# Patient Record
Sex: Male | Born: 1950
Health system: Southern US, Community
[De-identification: ages and names within clinical notes are randomized; demographics above are authoritative.]

## PROBLEM LIST (undated history)

## (undated) DIAGNOSIS — Z8673 Personal history of transient ischemic attack (TIA), and cerebral infarction without residual deficits: Secondary | ICD-10-CM

## (undated) DIAGNOSIS — E785 Hyperlipidemia, unspecified: Secondary | ICD-10-CM

## (undated) DIAGNOSIS — Z800 Family history of malignant neoplasm of digestive organs: Secondary | ICD-10-CM

## (undated) DIAGNOSIS — I10 Essential (primary) hypertension: Secondary | ICD-10-CM

## (undated) HISTORY — DX: Family history of malignant neoplasm of digestive organs: Z80.0

## (undated) HISTORY — DX: Essential (primary) hypertension: I10

## (undated) HISTORY — DX: Personal history of transient ischemic attack (TIA), and cerebral infarction without residual deficits: Z86.73

## (undated) HISTORY — PX: TONSILLECTOMY: SUR1361

## (undated) HISTORY — DX: Hyperlipidemia, unspecified: E78.5

---
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
## 2011-01-11 DIAGNOSIS — I639 Cerebral infarction, unspecified: Secondary | ICD-10-CM

## 2011-01-11 HISTORY — DX: Cerebral infarction, unspecified: I63.9

## 2011-06-22 DIAGNOSIS — I6330 Cerebral infarction due to thrombosis of unspecified cerebral artery: Secondary | ICD-10-CM | POA: Insufficient documentation

## 2011-06-22 DIAGNOSIS — Z8673 Personal history of transient ischemic attack (TIA), and cerebral infarction without residual deficits: Secondary | ICD-10-CM

## 2011-06-22 HISTORY — DX: Personal history of transient ischemic attack (TIA), and cerebral infarction without residual deficits: Z86.73

## 2012-12-31 DIAGNOSIS — M19049 Primary osteoarthritis, unspecified hand: Secondary | ICD-10-CM | POA: Insufficient documentation

## 2014-04-25 ENCOUNTER — Ambulatory Visit
Admit: 2014-04-25 | Disposition: A | Discharge status: Home / Self Care | Payer: Self-pay | Attending: Family Medicine | Admitting: Family Medicine

## 2014-06-27 ENCOUNTER — Telehealth: Payer: Self-pay | Admitting: Family Medicine

## 2014-06-27 DIAGNOSIS — B369 Superficial mycosis, unspecified: Secondary | ICD-10-CM | POA: Insufficient documentation

## 2014-06-27 MED ORDER — NYSTATIN-TRIAMCINOLONE 100000-0.1 UNIT/GM-% EX CREA: TOPICAL_CREAM | Freq: Two times a day (BID) | CUTANEOUS | Status: DC

## 2014-06-27 NOTE — Telephone Encounter (Signed)
Refilled

## 2014-06-27 NOTE — Telephone Encounter (Signed)
Requesting a refill on the ointment that you had prescribed for a rash that he had on his ankle (Nystatin) . Please send to [email or web address]

## 2014-06-27 NOTE — Telephone Encounter (Signed)
Patient is requesting a refill of his Nystatin Ointment

## 2014-07-07 ENCOUNTER — Other Ambulatory Visit: Payer: Self-pay | Admitting: Family Medicine

## 2014-07-07 DIAGNOSIS — I10 Essential (primary) hypertension: Secondary | ICD-10-CM

## 2014-07-07 MED ORDER — LOSARTAN POTASSIUM-HCTZ 100-25 MG PO TABS: 1.0000 | ORAL_TABLET | Freq: Every day | ORAL | Status: DC

## 2014-07-07 NOTE — Telephone Encounter (Signed)
Requesting refill on Losartan. Please send to [email or web address]. Last seen 04-25-14. Patient is completely out. [phone]

## 2014-07-07 NOTE — Telephone Encounter (Signed)
Received a message, stating he needed refill of Hyzaar (100/25)

## 2014-07-21 ENCOUNTER — Encounter: Payer: Self-pay | Admitting: Family Medicine

## 2014-07-21 ENCOUNTER — Ambulatory Visit (INDEPENDENT_AMBULATORY_CARE_PROVIDER_SITE_OTHER): Payer: BLUE CROSS/BLUE SHIELD | Admitting: Family Medicine

## 2014-07-21 VITALS — BP 110/68 | HR 100 | Temp 98.1°F | Resp 18 | Ht 65.0 in | Wt 141.1 lb

## 2014-07-21 DIAGNOSIS — M25569 Pain in unspecified knee: Secondary | ICD-10-CM | POA: Insufficient documentation

## 2014-07-21 DIAGNOSIS — R638 Other symptoms and signs concerning food and fluid intake: Secondary | ICD-10-CM | POA: Diagnosis not present

## 2014-07-21 DIAGNOSIS — Z131 Encounter for screening for diabetes mellitus: Secondary | ICD-10-CM | POA: Diagnosis not present

## 2014-07-21 DIAGNOSIS — Z125 Encounter for screening for malignant neoplasm of prostate: Secondary | ICD-10-CM

## 2014-07-21 DIAGNOSIS — I10 Essential (primary) hypertension: Secondary | ICD-10-CM

## 2014-07-21 DIAGNOSIS — I739 Peripheral vascular disease, unspecified: Secondary | ICD-10-CM | POA: Insufficient documentation

## 2014-07-21 DIAGNOSIS — E785 Hyperlipidemia, unspecified: Secondary | ICD-10-CM | POA: Diagnosis not present

## 2014-07-21 DIAGNOSIS — R21 Rash and other nonspecific skin eruption: Secondary | ICD-10-CM | POA: Insufficient documentation

## 2014-07-21 NOTE — Patient Instructions (Signed)
Dehydration, Adult Dehydration is when you lose more fluids from the body than you take in. Vital organs like the kidneys, brain, and heart cannot function without a proper amount of fluids and salt. Any loss of fluids from the body can cause dehydration.  CAUSES   Vomiting.  Diarrhea.  Excessive sweating.  Excessive urine output.  Fever. SYMPTOMS  Mild dehydration  Thirst.  Dry lips.  Slightly dry mouth. Moderate dehydration  Very dry mouth.  Sunken eyes.  Skin does not bounce back quickly when lightly pinched and released.  Dark urine and decreased urine production.  Decreased tear production.  Headache. Severe dehydration  Very dry mouth.  Extreme thirst.  Rapid, weak pulse (more than 100 beats per minute at rest).  Cold hands and feet.  Not able to sweat in spite of heat and temperature.  Rapid breathing.  Blue lips.  Confusion and lethargy.  Difficulty being awakened.  Minimal urine production.  No tears. DIAGNOSIS  Your caregiver will diagnose dehydration based on your symptoms and your exam. Blood and urine tests will help confirm the diagnosis. The diagnostic evaluation should also identify the cause of dehydration. TREATMENT  Treatment of mild or moderate dehydration can often be done at home by increasing the amount of fluids that you drink. It is best to drink small amounts of fluid more often. Drinking too much at one time can make vomiting worse. Refer to the home care instructions below. Severe dehydration needs to be treated at the hospital where you will probably be given intravenous (IV) fluids that contain water and electrolytes. HOME CARE INSTRUCTIONS   Ask your caregiver about specific rehydration instructions.  Drink enough fluids to keep your urine clear or pale yellow.  Drink small amounts frequently if you have nausea and vomiting.  Eat as you normally do.  Avoid:  Foods or drinks high in sugar.  Carbonated  drinks.  Juice.  Extremely hot or cold fluids.  Drinks with caffeine.  Fatty, greasy foods.  Alcohol.  Tobacco.  Overeating.  Gelatin desserts.  Wash your hands well to avoid spreading bacteria and viruses.  Only take over-the-counter or prescription medicines for pain, discomfort, or fever as directed by your caregiver.  Ask your caregiver if you should continue all prescribed and over-the-counter medicines.  Keep all follow-up appointments with your caregiver. SEEK MEDICAL CARE IF:  You have abdominal pain and it increases or stays in one area (localizes).  You have a rash, stiff neck, or severe headache.  You are irritable, sleepy, or difficult to awaken.  You are weak, dizzy, or extremely thirsty. SEEK IMMEDIATE MEDICAL CARE IF:   You are unable to keep fluids down or you get worse despite treatment.  You have frequent episodes of vomiting or diarrhea.  You have blood or green matter (bile) in your vomit.  You have blood in your stool or your stool looks black and tarry.  You have not urinated in 6 to 8 hours, or you have only urinated a small amount of very dark urine.  You have a fever.  You faint. MAKE SURE YOU:   Understand these instructions.  Will watch your condition.  Will get help right away if you are not doing well or get worse. Document Released: 12/27/2004 Document Revised: 03/21/2011 Document Reviewed: 08/16/2010 ExitCare Patient Information 2015 [employer]. This information is not intended to replace advice given to you by your health care provider. Make sure you discuss any questions you have with your health care   provider.  

## 2014-07-21 NOTE — Progress Notes (Signed)
Name: Xavier [name]   MRN: [id number]    DOB: June 29, 1950   Date:07/21/2014       Progress Note  Subjective  Chief Complaint  Chief Complaint  Patient presents with  . Dehydration    patient has an episode of dehydration on 07/09/14 from working outside a lot. Since then he has not had any energy. Patient states it feels like he had the flu. Patient states that his urine looks chalky and had decreased in volume.  . Nail Problem    patient states he has fungus on the left great toe.    HPI  Xavier [name] is here today with some vague symptoms of fatigue and changes in his urine volume and color. He suspects that it may be due to dehydration from working in the yard in the hot sun. He denies any fevers, focal neurological deficits, nausea, vomiting, loss of consciousness.  Associated symptoms include chest tightness with deep breathing, headaches, light headed with changing positions. He continues to take all of his medication as indicated. He is also concerned about the discoloration of his left 1st digit toenail.    Patient Active Problem List   Diagnosis Date Noted  . HLD (hyperlipidemia) 07/21/2014  . BP (high blood pressure) 07/21/2014  . Gonalgia 07/21/2014  . Peripheral blood vessel disorder 07/21/2014  . Cutaneous eruption 07/21/2014  . Fungal skin infection 06/27/2014  . Arthritis of hand, degenerative 12/31/2012  . Cerebral thrombosis with cerebral infarction 06/22/2011    History  Substance Use Topics  . Smoking status: Not on file  . Smokeless tobacco: Not on file  . Alcohol Use: Not on file     Current outpatient prescriptions:  .  losartan-hydrochlorothiazide (HYZAAR) 100-25 MG per tablet, Take 1 tablet by mouth daily., Disp: 30 tablet, Rfl: 5 .  meloxicam (MOBIC) 15 MG tablet, TK 1 T PO D, Disp: , Rfl: 2 .  nystatin-triamcinolone (MYCOLOG II) cream, Apply topically 2 (two) times daily., Disp: 60 g, Rfl: 1 .  simvastatin (ZOCOR) 40 MG tablet, TK 1 T PO NIGHTLY,  Disp: , Rfl: 1  Allergies not on file  Review of Systems  Ten systems reviewed and is negative except as mentioned in HPI.   Objective  BP 110/68 mmHg  Pulse 100  Temp(Src) 98.1 F (36.7 C) (Oral)  Resp 18  Ht 5\' 5"  (1.651 m)  Wt 141 lb 1.6 oz (64.003 kg)  BMI 23.48 kg/m2  SpO2 97%  Body mass index is 23.48 kg/(m^2).   Physical Exam  Constitutional: Patient appears well-developed and well-nourished. In no distress.  HEENT:  - Head: Normocephalic and atraumatic.  - Ears: Bilateral TMs gray, no erythema or effusion - Nose: Nasal mucosa moist - Mouth/Throat: Oropharynx is clear and moist. No tonsillar hypertrophy or erythema. No post nasal drainage.  - Eyes: Conjunctivae clear, EOM movements normal. PERRLA. No scleral icterus.  Neck: Normal range of motion. Neck supple. No JVD present. No thyromegaly present.  Cardiovascular: Normal rate, regular rhythm and normal heart sounds.  No murmur heard.  Pulmonary/Chest: Effort normal and breath sounds normal. No respiratory distress. Musculoskeletal: Normal range of motion bilateral UE and LE, no joint effusions. Peripheral vascular: Bilateral LE no edema. Neurological: CN II-XII grossly intact with no focal deficits. Alert and oriented to person, place, and time. Coordination, balance, strength, speech and gait are normal.  Skin: Skin is warm and dry. No rash noted. No erythema.  Psychiatric: Patient has a normal mood and affect. Behavior is normal  in office today. Judgment and thought content normal in office today.   Assessment & Plan  1. Hypertension goal BP (blood pressure) < 150/90 Well controled however this is "low" for Xavier [name], he usually has BPs around systolic 536-644 and diastolic 03-47 per previous vitals. Instructed patient to increase hydration and monitor symptoms.  - CBC with Differential/Platelet - Comprehensive metabolic panel - Lipid panel - TSH - HgB A1c - Magnesium  2. Hyperlipidemia LDL goal  <100 Due for blood work.  - CBC with Differential/Platelet - Comprehensive metabolic panel - Lipid panel - TSH - HgB A1c - Magnesium  3. Symptoms of dehydration Will get blood work. Instructed patient to increase hydration and monitor symptoms.  - CBC with Differential/Platelet - Comprehensive metabolic panel - TSH - Magnesium  4. Prostate cancer screening Due for blood work.  - PSA  5. Screening for diabetes mellitus (DM) Due for blood work.  - HgB A1c

## 2014-07-22 ENCOUNTER — Telehealth: Payer: Self-pay | Admitting: Family Medicine

## 2014-07-22 DIAGNOSIS — D72829 Elevated white blood cell count, unspecified: Secondary | ICD-10-CM | POA: Insufficient documentation

## 2014-07-22 DIAGNOSIS — R7989 Other specified abnormal findings of blood chemistry: Secondary | ICD-10-CM | POA: Insufficient documentation

## 2014-07-22 LAB — COMPREHENSIVE METABOLIC PANEL
ALT: 29 IU/L (ref 0–44)
AST: 14 IU/L (ref 0–40)
Albumin/Globulin Ratio: 1.4 (ref 1.1–2.5)
Albumin: 4.1 g/dL (ref 3.6–4.8)
Alkaline Phosphatase: 86 IU/L (ref 39–117)
BUN/Creatinine Ratio: 15 (ref 10–22)
BUN: 19 mg/dL (ref 8–27)
Bilirubin Total: 0.4 mg/dL (ref 0.0–1.2)
CO2: 25 mmol/L (ref 18–29)
Calcium: 9.4 mg/dL (ref 8.6–10.2)
Chloride: 97 mmol/L (ref 97–108)
Creatinine, Ser: 1.23 mg/dL (ref 0.76–1.27)
GFR calc non Af Amer: 62 mL/min/{1.73_m2} (ref >59)
GFR, EST AFRICAN AMERICAN: 72 mL/min/{1.73_m2} (ref >59)
GLUCOSE: 108 mg/dL — AB (ref 65–99)
Globulin, Total: 2.9 g/dL (ref 1.5–4.5)
Potassium: 4.4 mmol/L (ref 3.5–5.2)
SODIUM: 137 mmol/L (ref 134–144)
Total Protein: 7 g/dL (ref 6.0–8.5)

## 2014-07-22 LAB — CBC WITH DIFFERENTIAL/PLATELET
Basophils Absolute: 0 10*3/uL (ref 0.0–0.2)
Basos: 0 %
EOS (ABSOLUTE): 0.2 10*3/uL (ref 0.0–0.4)
Eos: 2 %
Hematocrit: 36.2 % — ABNORMAL LOW (ref 37.5–51.0)
Hemoglobin: 12.1 g/dL — ABNORMAL LOW (ref 12.6–17.7)
IMMATURE GRANS (ABS): 0 10*3/uL (ref 0.0–0.1)
Immature Granulocytes: 0 %
Lymphocytes Absolute: 1.9 10*3/uL (ref 0.7–3.1)
Lymphs: 17 %
MCH: 30.9 pg (ref 26.6–33.0)
MCHC: 33.4 g/dL (ref 31.5–35.7)
MCV: 92 fL (ref 79–97)
MONOS ABS: 0.8 10*3/uL (ref 0.1–0.9)
Monocytes: 7 %
Neutrophils Absolute: 8.3 10*3/uL — ABNORMAL HIGH (ref 1.4–7.0)
Neutrophils: 74 %
Platelets: 470 10*3/uL — ABNORMAL HIGH (ref 150–379)
RBC: 3.92 x10E6/uL — ABNORMAL LOW (ref 4.14–5.80)
RDW: 13.4 % (ref 12.3–15.4)
WBC: 11.3 10*3/uL — AB (ref 3.4–10.8)

## 2014-07-22 LAB — LIPID PANEL
CHOLESTEROL TOTAL: 136 mg/dL (ref 100–199)
Chol/HDL Ratio: 3.9 ratio units (ref 0.0–5.0)
HDL: 35 mg/dL — AB (ref >39)
LDL Calculated: 81 mg/dL (ref 0–99)
Triglycerides: 100 mg/dL (ref 0–149)
VLDL CHOLESTEROL CAL: 20 mg/dL (ref 5–40)

## 2014-07-22 LAB — HEMOGLOBIN A1C
ESTIMATED AVERAGE GLUCOSE: 120 mg/dL
Hgb A1c MFr Bld: 5.8 % — ABNORMAL HIGH (ref 4.8–5.6)

## 2014-07-22 LAB — TSH: TSH: 0.254 u[IU]/mL — ABNORMAL LOW (ref 0.450–4.500)

## 2014-07-22 LAB — PSA: Prostate Specific Ag, Serum: 4 ng/mL (ref 0.0–4.0)

## 2014-07-22 LAB — MAGNESIUM: Magnesium: 2 mg/dL (ref 1.6–2.3)

## 2014-07-22 MED ORDER — LEVOFLOXACIN 500 MG PO TABS: 500.0000 mg | ORAL_TABLET | Freq: Every day | ORAL | Status: DC

## 2014-07-22 NOTE — Telephone Encounter (Signed)
Please let Xavier [name] know that his blood work shows that he most likely has an infection of the urine for which I have sent in Levaquin 500mg  one a day for 10 days (antibiotic) and then I want him to follow up with me after finishing antibiotics to repeat some blood work to make sure things have resolved.

## 2014-07-22 NOTE — Telephone Encounter (Signed)
Tried to contact this patient to review results from his recent blood work but there was no answer. A message was left for this patient to give [place] a call back when he got the chance.

## 2014-07-29 NOTE — Telephone Encounter (Signed)
Awaiting patient call back

## 2014-08-13 ENCOUNTER — Other Ambulatory Visit: Payer: Self-pay

## 2014-08-13 DIAGNOSIS — E785 Hyperlipidemia, unspecified: Secondary | ICD-10-CM

## 2014-08-13 MED ORDER — SIMVASTATIN 40 MG PO TABS: 40.0000 mg | ORAL_TABLET | Freq: Every day | ORAL | Status: DC

## 2014-08-13 NOTE — Telephone Encounter (Signed)
Refill request was sent to Dr. Bobetta Lime for approval and submission.  Simvastatin 40MG , 1 Tablet daily, #30, 30 days starting 04/25/2014, Ref. x1. Active.  Associated Diagnosis:Hyperlipidemia with target LDL less than 100 (272.4  E78.5) Recorded 04/25/2014 10:22 AM by Bobetta Lime, MD, Office Visit.

## 2015-01-29 ENCOUNTER — Other Ambulatory Visit: Payer: Self-pay

## 2015-01-29 DIAGNOSIS — I10 Essential (primary) hypertension: Secondary | ICD-10-CM

## 2015-01-29 MED ORDER — LOSARTAN POTASSIUM-HCTZ 100-25 MG PO TABS: 1.0000 | ORAL_TABLET | Freq: Every day | ORAL | Status: DC

## 2015-02-09 ENCOUNTER — Other Ambulatory Visit: Payer: Self-pay

## 2015-02-09 DIAGNOSIS — I10 Essential (primary) hypertension: Secondary | ICD-10-CM

## 2015-02-10 MED ORDER — LOSARTAN POTASSIUM-HCTZ 100-25 MG PO TABS: 1.0000 | ORAL_TABLET | Freq: Every day | ORAL | Status: DC

## 2015-02-20 ENCOUNTER — Other Ambulatory Visit: Payer: Self-pay

## 2015-02-20 DIAGNOSIS — I10 Essential (primary) hypertension: Secondary | ICD-10-CM

## 2015-02-20 MED ORDER — LOSARTAN POTASSIUM-HCTZ 100-25 MG PO TABS: 1.0000 | ORAL_TABLET | Freq: Every day | ORAL | Status: DC

## 2015-02-23 ENCOUNTER — Other Ambulatory Visit: Payer: Self-pay

## 2015-03-02 ENCOUNTER — Ambulatory Visit: Payer: BLUE CROSS/BLUE SHIELD | Admitting: Family Medicine

## 2015-03-06 ENCOUNTER — Telehealth: Payer: Self-pay | Admitting: Family Medicine

## 2015-03-06 ENCOUNTER — Ambulatory Visit: Payer: BLUE CROSS/BLUE SHIELD | Admitting: Family Medicine

## 2015-03-06 NOTE — Telephone Encounter (Signed)
Pt needs enough BP meds called into [facility] to last until his appt on 03/10/2015.

## 2015-03-09 ENCOUNTER — Other Ambulatory Visit: Payer: Self-pay

## 2015-03-09 DIAGNOSIS — I10 Essential (primary) hypertension: Secondary | ICD-10-CM

## 2015-03-10 ENCOUNTER — Encounter: Payer: Self-pay | Admitting: Family Medicine

## 2015-03-10 ENCOUNTER — Ambulatory Visit (INDEPENDENT_AMBULATORY_CARE_PROVIDER_SITE_OTHER): Payer: Self-pay | Admitting: Family Medicine

## 2015-03-10 VITALS — BP 162/96 | HR 78 | Temp 98.8°F | Resp 16 | Ht 65.0 in | Wt 159.2 lb

## 2015-03-10 DIAGNOSIS — R7989 Other specified abnormal findings of blood chemistry: Secondary | ICD-10-CM

## 2015-03-10 DIAGNOSIS — E785 Hyperlipidemia, unspecified: Secondary | ICD-10-CM

## 2015-03-10 DIAGNOSIS — I10 Essential (primary) hypertension: Secondary | ICD-10-CM

## 2015-03-10 DIAGNOSIS — M19041 Primary osteoarthritis, right hand: Secondary | ICD-10-CM

## 2015-03-10 DIAGNOSIS — M1990 Unspecified osteoarthritis, unspecified site: Secondary | ICD-10-CM

## 2015-03-10 DIAGNOSIS — M19042 Primary osteoarthritis, left hand: Secondary | ICD-10-CM | POA: Insufficient documentation

## 2015-03-10 DIAGNOSIS — I868 Varicose veins of other specified sites: Secondary | ICD-10-CM

## 2015-03-10 DIAGNOSIS — R21 Rash and other nonspecific skin eruption: Secondary | ICD-10-CM

## 2015-03-10 MED ORDER — PRAVASTATIN SODIUM 20 MG PO TABS: 20.0000 mg | ORAL_TABLET | Freq: Every day | ORAL | Status: DC

## 2015-03-10 MED ORDER — LOSARTAN POTASSIUM-HCTZ 100-25 MG PO TABS: 1.0000 | ORAL_TABLET | Freq: Every day | ORAL | Status: DC

## 2015-03-10 MED ORDER — HYDROCORTISONE VALERATE 0.2 % EX CREA: 1.0000 "application " | TOPICAL_CREAM | Freq: Two times a day (BID) | CUTANEOUS | Status: DC

## 2015-03-10 MED ORDER — HYDROXYZINE HCL 10 MG PO TABS: 10.0000 mg | ORAL_TABLET | Freq: Three times a day (TID) | ORAL | Status: DC | PRN

## 2015-03-10 MED ORDER — PREDNISONE 10 MG (21) PO TBPK: ORAL_TABLET | ORAL | Status: DC

## 2015-03-10 NOTE — Progress Notes (Signed)
Name: Xavier [name]   MRN: [id number]    DOB: [birth year]   Date:03/11/2015       Progress Note  Subjective  Chief Complaint  Chief Complaint  Patient presents with  . Medication Refill  . Hypertension    Patient states is currently out of medication.  . Hyperlipidemia  . Rash    Onset 2 weeks, bilateral arms and legs and spreading. Currently using blue star ointment, gold bond powder, and calamine lotion. Symptoms include redness, swelling, and itching.    HPI  Mrl Xavier [name] is a 65 year old male who has not followed up with me since about 07/21/14.  He has since ran out of his medication for HTN and HLD. He is self pay today and reports worsening skin lesions.   If you may recall he first showed me a flat slightly raised and hyperpigmented lesion on his lower legs about a 1 year ago. Topical treatment was attempted and he was lost for follow up. He now reports having extensive skin dryness, pruritis, thickening located on extensor surfaces of bilateral arms, both lower extremities, back of neck. Not involving back or chest or abdomen. He has been slathering calamine lotion on it.   On further questioning he does recall that mother has rheumatological issues and father had vascular issues. I noticed his hands had nodular changes to MCP/PIP joints and mild ulnar deviation and nails looked brittle and flattened in areas. He then states yes he does have stiffness and achy pain in his hands but he just deals with it.   Patient presents with hyperlipidemia.  He was tested because co morbid conditions include HTN. There is a family history of hyperlipidemia. There is not a family history of early ischemia heart disease.  He requests refills of his statin medication.  First diagnosed with hypertension several years ago. Current anti-hypertension medication regimen includes dietary modification, weight management and Losartan-HCTZ 100-25 mg one a day.  Patient is following physician  recommended management (did run out of his medication). Not checking blood pressure outside of physician office. Associated symptoms do not include headache, dizziness, nausea, lower extremity swelling, worsening shortness of breath, chest pain, numbness.  Past Medical History  Diagnosis Date  . Hypertension   . Hyperlipidemia     Patient Active Problem List   Diagnosis Date Noted  . Arthritis of both hands 03/10/2015  . Spider varicose veins 03/10/2015  . Low serum thyroid stimulating hormone (TSH) 07/22/2014  . Hyperlipidemia LDL goal <100 07/21/2014  . Hypertension goal BP (blood pressure) < 140/90 07/21/2014  . Peripheral arterial disease ([place]) 07/21/2014  . Cutaneous eruption 07/21/2014  . Fungal skin infection 06/27/2014  . Arthritis of hand, degenerative 12/31/2012  . History of CVA (cerebrovascular accident) 06/22/2011    Social History  Substance Use Topics  . Smoking status: Former Research scientist (life sciences)  . Smokeless tobacco: Not on file  . Alcohol Use: No     Current outpatient prescriptions:  .  hydrocortisone valerate cream (WESTCORT) 0.2 %, Apply 1 application topically 2 (two) times daily., Disp: 60 g, Rfl: 1 .  hydrOXYzine (ATARAX/VISTARIL) 10 MG tablet, Take 1 tablet (10 mg total) by mouth 3 (three) times daily as needed., Disp: 30 tablet, Rfl: 0 .  losartan-hydrochlorothiazide (HYZAAR) 100-25 MG tablet, Take 1 tablet by mouth daily., Disp: 14 tablet, Rfl: 0 .  pravastatin (PRAVACHOL) 20 MG tablet, Take 1 tablet (20 mg total) by mouth daily., Disp: 90 tablet, Rfl: 3 .  predniSONE (STERAPRED UNI-PAK  21 TAB) 10 MG (21) TBPK tablet, Use as directed in a 6 day taper PredPak, Disp: 21 tablet, Rfl: 0  History reviewed. No pertinent past surgical history.  Family History  Problem Relation Age of Onset  . Arthritis Mother   . Heart disease Father     Not on File   Review of Systems  CONSTITUTIONAL: No significant weight changes, fever, chills, weakness or fatigue.  HEENT:   - Eyes: No visual changes.  - Ears: No auditory changes. No pain.  - Nose: No sneezing, congestion, runny nose. - Throat: No sore throat. No changes in swallowing. SKIN: Yes rash. CARDIOVASCULAR: No chest pain, chest pressure or chest discomfort. No palpitations. Yes propensity for LE edema.  RESPIRATORY: No shortness of breath, cough or sputum.  GASTROINTESTINAL: No anorexia, nausea, vomiting. No changes in bowel habits. No abdominal pain or blood.  GENITOURINARY: No dysuria. No frequency. No discharge.  NEUROLOGICAL: No headache, dizziness, syncope, paralysis, ataxia, numbness or tingling in the extremities. No memory changes. No change in bowel or bladder control.  MUSCULOSKELETAL: Yes joint pain. No muscle pain. HEMATOLOGIC: No anemia, bleeding or bruising.  LYMPHATICS: No enlarged lymph nodes.  PSYCHIATRIC: No change in mood. No change in sleep pattern.  ENDOCRINOLOGIC: No reports of sweating, cold or heat intolerance. No polyuria or polydipsia.     Objective  BP 162/96 mmHg  Pulse 78  Temp(Src) 98.8 F (37.1 C) (Oral)  Resp 16  Ht 5\' 5"  (1.651 m)  Wt 159 lb 3.2 oz (72.213 kg)  BMI 26.49 kg/m2  SpO2 97% Body mass index is 26.49 kg/(m^2).  Physical Exam  Constitutional: Patient appears well-developed and well-nourished. In no distress.  HEENT:  - Head: Normocephalic and atraumatic.  - Ears: Bilateral TMs gray, no erythema or effusion - Nose: Nasal mucosa moist - Mouth/Throat: Oropharynx is clear and moist. No tonsillar hypertrophy or erythema. No post nasal drainage.  - Eyes: Conjunctivae clear, EOM movements normal. PERRLA. No scleral icterus.  Neck: Normal range of motion. Neck supple. No JVD present. No thyromegaly present.  Cardiovascular: Normal rate, regular rhythm and normal heart sounds.  No murmur heard.  Pulmonary/Chest: Effort normal and breath sounds normal. No respiratory distress. Musculoskeletal: Normal range of motion bilateral UE and LE. Nodular boggy  MCP/PIP joints with depressed brittle finger nails and early stages of ulnar deviation of fingers bilaterally.  Peripheral vascular: Bilateral LE trace pedal edema with extensive spider veins.  Neurological: CN II-XII grossly intact with no focal deficits. Alert and oriented to person, place, and time. Coordination, balance, strength, speech and gait are normal.  Skin: Skin is warm and dry. Raised pink/red flat flaky rash on extensors of arms, back of neck, lower legs.  Psychiatric: Patient has a normal mood and affect. Behavior is normal in office today. Judgment and thought content normal in office today.   Assessment & Plan  1. Arthritis of both hands Strongly suspect Psoriatic Arthritis as a clinical diagnosis and I attempted to get blood work and plug him in with Rheumatology or Dermatology however patient is self pay and could not afford lab work at this time. Will reach out to Rheum office and see if they can still see patient and offer services or redirect him to health maybe in the [place] area.   - ANA - Cyclic citrul peptide antibody, IgG - Rheumatoid factor - Sedimentation rate  2. Cutaneous eruption Temporary relief may be bought with prednisone, steroid topical cream and atarax. However if he has an  underlying autoimmune/rheumatological disorder he will need oral biological therapy.  - predniSONE (STERAPRED UNI-PAK 21 TAB) 10 MG (21) TBPK tablet; Use as directed in a 6 day taper PredPak  Dispense: 21 tablet; Refill: 0 - hydrocortisone valerate cream (WESTCORT) 0.2 %; Apply 1 application topically 2 (two) times daily.  Dispense: 60 g; Refill: 1 - hydrOXYzine (ATARAX/VISTARIL) 10 MG tablet; Take 1 tablet (10 mg total) by mouth 3 (three) times daily as needed.  Dispense: 30 tablet; Refill: 0  3. Hypertension goal BP (blood pressure) < 140/90 Sub optimal today out of meds.  - CBC with Differential/Platelet - Comprehensive metabolic panel - losartan-hydrochlorothiazide (HYZAAR)  100-25 MG tablet; Take 1 tablet by mouth daily.  Dispense: 14 tablet; Refill: 0  4. Low serum thyroid stimulating hormone (TSH) Previously low TSH I wanted to recheck.  - TSH - T3, free - T4, free  5. Spider varicose veins Familial propensity.  6. Hyperlipidemia LDL goal <100 He couldn't afford labs. I stopped simvastatin and put him on pravastatin as I feel this has more efficacy.   - pravastatin (PRAVACHOL) 20 MG tablet; Take 1 tablet (20 mg total) by mouth daily.  Dispense: 90 tablet; Refill: 3

## 2015-03-11 ENCOUNTER — Encounter: Payer: Self-pay | Admitting: Family Medicine

## 2015-03-11 ENCOUNTER — Telehealth: Payer: Self-pay | Admitting: Family Medicine

## 2015-03-11 NOTE — Telephone Encounter (Signed)
Xavier [name] can you please call Rheumatology office and inquire about my thoughts see below:  Strongly suspect Psoriatic Arthritis as a clinical diagnosis and I attempted to get blood work and plug him in with Rheumatology or Dermatology however patient is self pay and could not afford lab work at this time. He has extensive plaque lesions on extensor surfaces of sun exposed areas and ulnar deviation of fingers with swelling in hand joints.   Will Rheum office see patient as SELF PAY? What is the cost? Can they offer services or redirect him to free health clinic maybe in the [place] area.

## 2015-03-12 NOTE — Telephone Encounter (Signed)
Talk to patient first and express to him that I think it is very important that he consults with rheumatology to confirm the diagnosis that I suspect which is Psoriatic Arthritis. If he does not get treatment regularly he could end up very sick, his rash may get worse, his arthritis could become debilitating.  If he agrees I will place the referral.

## 2015-03-12 NOTE — Telephone Encounter (Signed)
Ok I contacted [facility] ([place]) They state they can see him and he would have to pay $100 up front and then be setup on payment plans.  Do you want to place referral? Or do you want me to contact patient and notify and see what he wants to do?

## 2015-03-17 NOTE — Telephone Encounter (Signed)
Sent patient a letter to discuss, because phone number listed was incorrect.

## 2015-03-30 ENCOUNTER — Telehealth: Payer: Self-pay | Admitting: Family Medicine

## 2015-03-30 NOTE — Telephone Encounter (Signed)
Patient received a letter to call the office to obtain results about his rash. Wife Manuela Schwartz) did update his telephone number and email address. Please return call

## 2015-04-01 ENCOUNTER — Other Ambulatory Visit: Payer: Self-pay

## 2015-04-01 DIAGNOSIS — I10 Essential (primary) hypertension: Secondary | ICD-10-CM

## 2015-04-01 MED ORDER — LOSARTAN POTASSIUM-HCTZ 100-25 MG PO TABS: 1.0000 | ORAL_TABLET | Freq: Every day | ORAL | Status: DC

## 2015-04-02 ENCOUNTER — Other Ambulatory Visit: Payer: Self-pay

## 2015-04-02 DIAGNOSIS — L4050 Arthropathic psoriasis, unspecified: Secondary | ICD-10-CM

## 2015-07-20 ENCOUNTER — Other Ambulatory Visit: Payer: Self-pay | Admitting: Family Medicine

## 2015-07-20 NOTE — Telephone Encounter (Signed)
Patient has not had labs done in about a year I really must insist that he be seen and get labs checked to monitor his kidneys on this medicine Dr. Ancil Boozer' note in March said he needed to be seen for further refills I sent in one week of medicine

## 2015-07-20 NOTE — Telephone Encounter (Signed)
Patient requesting refill. 

## 2015-07-21 NOTE — Telephone Encounter (Signed)
LMOM to get patient to call for appt

## 2015-10-02 ENCOUNTER — Ambulatory Visit: Payer: Self-pay | Admitting: Family Medicine

## 2016-02-03 ENCOUNTER — Telehealth: Payer: Self-pay | Admitting: Family Medicine

## 2016-02-03 ENCOUNTER — Ambulatory Visit (INDEPENDENT_AMBULATORY_CARE_PROVIDER_SITE_OTHER): Payer: BLUE CROSS/BLUE SHIELD | Admitting: Family Medicine

## 2016-02-03 ENCOUNTER — Telehealth: Payer: Self-pay | Admitting: General Surgery

## 2016-02-03 ENCOUNTER — Encounter: Payer: Self-pay | Admitting: Family Medicine

## 2016-02-03 VITALS — BP 162/102 | HR 76 | Temp 97.9°F | Resp 14 | Ht 67.4 in | Wt 147.7 lb

## 2016-02-03 DIAGNOSIS — I10 Essential (primary) hypertension: Secondary | ICD-10-CM

## 2016-02-03 DIAGNOSIS — Z1211 Encounter for screening for malignant neoplasm of colon: Secondary | ICD-10-CM | POA: Diagnosis not present

## 2016-02-03 DIAGNOSIS — R946 Abnormal results of thyroid function studies: Secondary | ICD-10-CM

## 2016-02-03 DIAGNOSIS — Z0000 Encounter for general adult medical examination without abnormal findings: Secondary | ICD-10-CM

## 2016-02-03 DIAGNOSIS — Z8673 Personal history of transient ischemic attack (TIA), and cerebral infarction without residual deficits: Secondary | ICD-10-CM | POA: Diagnosis not present

## 2016-02-03 DIAGNOSIS — Z23 Encounter for immunization: Secondary | ICD-10-CM

## 2016-02-03 DIAGNOSIS — R0989 Other specified symptoms and signs involving the circulatory and respiratory systems: Secondary | ICD-10-CM | POA: Diagnosis not present

## 2016-02-03 DIAGNOSIS — Z125 Encounter for screening for malignant neoplasm of prostate: Secondary | ICD-10-CM | POA: Diagnosis not present

## 2016-02-03 DIAGNOSIS — R7989 Other specified abnormal findings of blood chemistry: Secondary | ICD-10-CM

## 2016-02-03 LAB — LIPID PANEL
CHOL/HDL RATIO: 3.2 ratio (ref <5.0)
Cholesterol: 198 mg/dL (ref <200)
HDL: 62 mg/dL (ref >40)
LDL CALC: 116 mg/dL — AB (ref <100)
TRIGLYCERIDES: 99 mg/dL (ref <150)
VLDL: 20 mg/dL (ref <30)

## 2016-02-03 LAB — CBC WITH DIFFERENTIAL/PLATELET
BASOS ABS: 0 {cells}/uL (ref 0–200)
Basophils Relative: 0 %
EOS PCT: 5 %
Eosinophils Absolute: 285 cells/uL (ref 15–500)
HCT: 41 % (ref 38.5–50.0)
Hemoglobin: 13.4 g/dL (ref 13.2–17.1)
LYMPHS PCT: 29 %
Lymphs Abs: 1653 cells/uL (ref 850–3900)
MCH: 31.2 pg (ref 27.0–33.0)
MCHC: 32.7 g/dL (ref 32.0–36.0)
MCV: 95.6 fL (ref 80.0–100.0)
MPV: 9.7 fL (ref 7.5–12.5)
Monocytes Absolute: 570 cells/uL (ref 200–950)
Monocytes Relative: 10 %
NEUTROS ABS: 3192 {cells}/uL (ref 1500–7800)
Neutrophils Relative %: 56 %
PLATELETS: 270 10*3/uL (ref 140–400)
RBC: 4.29 MIL/uL (ref 4.20–5.80)
RDW: 13.4 % (ref 11.0–15.0)
WBC: 5.7 10*3/uL (ref 3.8–10.8)

## 2016-02-03 LAB — COMPLETE METABOLIC PANEL WITH GFR
ALK PHOS: 68 U/L (ref 40–115)
ALT: 11 U/L (ref 9–46)
AST: 15 U/L (ref 10–35)
Albumin: 4.1 g/dL (ref 3.6–5.1)
BUN: 12 mg/dL (ref 7–25)
CO2: 29 mmol/L (ref 20–31)
Calcium: 9.7 mg/dL (ref 8.6–10.3)
Chloride: 106 mmol/L (ref 98–110)
Creat: 1.21 mg/dL (ref 0.70–1.25)
GFR, EST AFRICAN AMERICAN: 73 mL/min (ref >=60)
GFR, EST NON AFRICAN AMERICAN: 63 mL/min (ref >=60)
GLUCOSE: 134 mg/dL — AB (ref 65–99)
POTASSIUM: 4.6 mmol/L (ref 3.5–5.3)
SODIUM: 141 mmol/L (ref 135–146)
Total Bilirubin: 0.6 mg/dL (ref 0.2–1.2)
Total Protein: 6.8 g/dL (ref 6.1–8.1)

## 2016-02-03 LAB — PSA: PSA: 2.7 ng/mL (ref <=4.0)

## 2016-02-03 LAB — TSH: TSH: 0.75 m[IU]/L (ref 0.40–4.50)

## 2016-02-03 MED ORDER — LOSARTAN POTASSIUM-HCTZ 100-25 MG PO TABS: 1.0000 | ORAL_TABLET | Freq: Every day | ORAL | 1 refills | Status: DC

## 2016-02-03 MED ORDER — PRAVASTATIN SODIUM 20 MG PO TABS: 20.0000 mg | ORAL_TABLET | Freq: Every day | ORAL | 1 refills | Status: DC

## 2016-02-03 NOTE — Patient Instructions (Addendum)
Try to follow the DASH guidelines (DASH stands for Dietary Approaches to Stop Hypertension) Try to limit the sodium in your diet.  Ideally, consume less than 1.5 grams (less than 1,500mg ) per day. Do not add salt when cooking or at the table.  Check the sodium amount on labels when shopping, and choose items lower in sodium when given a choice. Avoid or limit foods that already contain a lot of sodium. Eat a diet rich in fruits and vegetables and whole grains. Try to limit saturated fats in your diet (bologna, hot dogs, barbeque, cheeseburgers, hamburgers, steak, bacon, sausage, cheese, etc.) and get more fresh fruits, vegetables, and whole grains  Start back on your blood pressure medicine and your cholesterol medicine Start a baby coated 81 mg aspirin daily  Return in 2 weeks for recheck blood pressure and repeat lab (to check your kidneys back on the medicine)  I've put in a referral to the gastroenterologist so you can have a colonoscopy Let's get labs today If you have not heard anything from my staff in a week about any orders/referrals/studies from today, please contact us here to follow-up [phone]   Cholesterol Cholesterol is a fat. Your body needs a small amount of cholesterol. Cholesterol (plaque) may build up in your blood vessels (arteries). That makes you more likely to have a heart attack or stroke. You cannot feel your cholesterol level. Having a blood test is the only way to find out if your level is high. Keep your test results. Work with your doctor to keep your cholesterol at a good level. What do the results mean?  Total cholesterol is how much cholesterol is in your blood.  LDL is bad cholesterol. This is the type that can build up. Try to have low LDL.  HDL is good cholesterol. It cleans your blood vessels and carries LDL away. Try to have high HDL.  Triglycerides are fat that the body can store or burn for energy. What are good levels of cholesterol?  Total  cholesterol below 200.  LDL below 100 is good for people who have health risks. LDL below 70 is good for people who have very high risks.  HDL above 40 is good. It is best to have HDL of 60 or higher.  Triglycerides below 150. How can I lower my cholesterol? Diet  Follow your diet program as told by your doctor.  Choose fish, white meat chicken, or [place] that is roasted or baked. Try not to eat red meat, fried foods, sausage, or lunch meats.  Eat lots of fresh fruits and vegetables.  Choose whole grains, beans, pasta, potatoes, and cereals.  Choose olive oil, corn oil, or canola oil. Only use small amounts.  Try not to eat butter, mayonnaise, shortening, or palm kernel oils.  Try not to eat foods with trans fats.  Choose low-fat or nonfat dairy foods.  Drink skim or nonfat milk.  Eat low-fat or nonfat yogurt and cheeses.  Try not to drink whole milk or cream.  Try not to eat ice cream, egg yolks, or full-fat cheeses.  Healthy desserts include angel food cake, ginger snaps, animal crackers, hard candy, popsicles, and low-fat or nonfat frozen yogurt. Try not to eat pastries, cakes, pies, and cookies. Exercise  Follow your exercise program as told by your doctor.  Be more active. Try gardening, walking, and taking the stairs.  Ask your doctor about ways that you can be more active. Medicine  Take over-the-counter and prescription medicines only  as told by your doctor. This information is not intended to replace advice given to you by your health care provider. Make sure you discuss any questions you have with your health care provider. Document Released: 03/25/2008 Document Revised: 07/29/2015 Document Reviewed: 07/09/2015 Elsevier Interactive Patient Education  2017 [place] Maintenance, Male A healthy lifestyle and preventative care can promote health and wellness.  Maintain regular health, dental, and eye exams.  Eat a healthy diet. Foods like  vegetables, fruits, whole grains, low-fat dairy products, and lean protein foods contain the nutrients you need and are low in calories. Decrease your intake of foods high in solid fats, added sugars, and salt. Get information about a proper diet from your health care provider, if necessary.  Regular physical exercise is one of the most important things you can do for your health. Most adults should get at least 150 minutes of moderate-intensity exercise (any activity that increases your heart rate and causes you to sweat) each week. In addition, most adults need muscle-strengthening exercises on 2 or more days a week.   Maintain a healthy weight. The body mass index (BMI) is a screening tool to identify possible weight problems. It provides an estimate of body fat based on height and weight. Your health care provider can find your BMI and can help you achieve or maintain a healthy weight. For males 20 years and older:  A BMI below 18.5 is considered underweight.  A BMI of 18.5 to 24.9 is normal.  A BMI of 25 to 29.9 is considered overweight.  A BMI of 30 and above is considered obese.  Maintain normal blood lipids and cholesterol by exercising and minimizing your intake of saturated fat. Eat a balanced diet with plenty of fruits and vegetables. Blood tests for lipids and cholesterol should begin at age 52 and be repeated every 5 years. If your lipid or cholesterol levels are high, you are over age 16, or you are at high risk for heart disease, you may need your cholesterol levels checked more frequently.Ongoing high lipid and cholesterol levels should be treated with medicines if diet and exercise are not working.  If you smoke, find out from your health care provider how to quit. If you do not use tobacco, do not start.  Lung cancer screening is recommended for adults aged 71-80 years who are at high risk for developing lung cancer because of a history of smoking. A yearly low-dose CT scan of  the lungs is recommended for people who have at least a 30-pack-year history of smoking and are current smokers or have quit within the past 15 years. A pack year of smoking is smoking an average of 1 pack of cigarettes a day for 1 year (for example, a 30-pack-year history of smoking could mean smoking 1 pack a day for 30 years or 2 packs a day for 15 years). Yearly screening should continue until the smoker has stopped smoking for at least 15 years. Yearly screening should be stopped for people who develop a health problem that would prevent them from having lung cancer treatment.  If you choose to drink alcohol, do not have more than 2 drinks per day. One drink is considered to be 12 oz (360 mL) of beer, 5 oz (150 mL) of wine, or 1.5 oz (45 mL) of liquor.  Avoid the use of street drugs. Do not share needles with anyone. Ask for help if you need support or instructions about stopping the use of drugs.  High blood pressure causes heart disease and increases the risk of stroke. High blood pressure is more likely to develop in:  People who have blood pressure in the end of the normal range (100-139/85-89 mm Hg).  People who are overweight or obese.  People who are African American.  If you are 94-110 years of age, have your blood pressure checked every 3-5 years. If you are 6 years of age or older, have your blood pressure checked every year. You should have your blood pressure measured twice-once when you are at a hospital or clinic, and once when you are not at a hospital or clinic. Record the average of the two measurements. To check your blood pressure when you are not at a hospital or clinic, you can use:  An automated blood pressure machine at a pharmacy.  A home blood pressure monitor.  If you are 26-73 years old, ask your health care provider if you should take aspirin to prevent heart disease.  Diabetes screening involves taking a blood sample to check your fasting blood sugar level.  This should be done once every 3 years after age 80 if you are at a normal weight and without risk factors for diabetes. Testing should be considered at a younger age or be carried out more frequently if you are overweight and have at least 1 risk factor for diabetes.  Colorectal cancer can be detected and often prevented. Most routine colorectal cancer screening begins at the age of 29 and continues through age 29. However, your health care provider may recommend screening at an earlier age if you have risk factors for colon cancer. On a yearly basis, your health care provider may provide home test kits to check for hidden blood in the stool. A small camera at the end of a tube may be used to directly examine the colon (sigmoidoscopy or colonoscopy) to detect the earliest forms of colorectal cancer. Talk to your health care provider about this at age 47 when routine screening begins. A direct exam of the colon should be repeated every 5-10 years through age 72, unless early forms of precancerous polyps or small growths are found.  People who are at an increased risk for hepatitis B should be screened for this virus. You are considered at high risk for hepatitis B if:  You were born in a country where hepatitis B occurs often. Talk with your health care provider about which countries are considered high risk.  Your parents were born in a high-risk country and you have not received a shot to protect against hepatitis B (hepatitis B vaccine).  You have HIV or AIDS.  You use needles to inject street drugs.  You live with, or have sex with, someone who has hepatitis B.  You are a man who has sex with other men (MSM).  You get hemodialysis treatment.  You take certain medicines for conditions like cancer, organ transplantation, and autoimmune conditions.  Hepatitis C blood testing is recommended for all people born from [birth year] through 1964 and any individual with known risk factors for hepatitis  C.  Healthy men should no longer receive prostate-specific antigen (PSA) blood tests as part of routine cancer screening. Talk to your health care provider about prostate cancer screening.  Testicular cancer screening is not recommended for adolescents or adult males who have no symptoms. Screening includes self-exam, a health care provider exam, and other screening tests. Consult with your health care provider about any symptoms you have or any  concerns you have about testicular cancer.  Practice safe sex. Use condoms and avoid high-risk sexual practices to reduce the spread of sexually transmitted infections (STIs).  You should be screened for STIs, including gonorrhea and chlamydia if:  You are sexually active and are younger than 24 years.  You are older than 24 years, and your health care provider tells you that you are at risk for this type of infection.  Your sexual activity has changed since you were last screened, and you are at an increased risk for chlamydia or gonorrhea. Ask your health care provider if you are at risk.  If you are at risk of being infected with HIV, it is recommended that you take a prescription medicine daily to prevent HIV infection. This is called pre-exposure prophylaxis (PrEP). You are considered at risk if:  You are a man who has sex with other men (MSM).  You are a heterosexual man who is sexually active with multiple partners.  You take drugs by injection.  You are sexually active with a partner who has HIV.  Talk with your health care provider about whether you are at high risk of being infected with HIV. If you choose to begin PrEP, you should first be tested for HIV. You should then be tested every 3 months for as long as you are taking PrEP.  Use sunscreen. Apply sunscreen liberally and repeatedly throughout the day. You should seek shade when your shadow is shorter than you. Protect yourself by wearing long sleeves, pants, a wide-brimmed hat, and  sunglasses year round whenever you are outdoors.  Tell your health care provider of new moles or changes in moles, especially if there is a change in shape or color. Also, tell your health care provider if a mole is larger than the size of a pencil eraser.  A one-time screening for abdominal aortic aneurysm (AAA) and surgical repair of large AAAs by ultrasound is recommended for men aged 33-75 years who are current or former smokers.  Stay current with your vaccines (immunizations). This information is not intended to replace advice given to you by your health care provider. Make sure you discuss any questions you have with your health care provider. Document Released: 06/25/2007 Document Revised: 01/17/2014 Document Reviewed: 09/30/2014 Elsevier Interactive Patient Education  2017 [place] DASH stands for "Dietary Approaches to Stop Hypertension." The DASH eating plan is a healthy eating plan that has been shown to reduce high blood pressure (hypertension). Additional health benefits may include reducing the risk of type 2 diabetes mellitus, heart disease, and stroke. The DASH eating plan may also help with weight loss. What do I need to know about the DASH eating plan? For the DASH eating plan, you will follow these general guidelines:  Choose foods with less than 150 milligrams of sodium per serving (as listed on the food label).  Use salt-free seasonings or herbs instead of table salt or sea salt.  Check with your health care provider or pharmacist before using salt substitutes.  Eat lower-sodium products. These are often labeled as "low-sodium" or "no salt added."  Eat fresh foods. Avoid eating a lot of canned foods.  Eat more vegetables, fruits, and low-fat dairy products.  Choose whole grains. Look for the word "whole" as the first word in the ingredient list.  Choose fish and skinless chicken or [place] more often than red meat. Limit fish, poultry, and meat  to 6 oz (170 g) each day.  Limit sweets,  desserts, sugars, and sugary drinks.  Choose heart-healthy fats.  Eat more home-cooked food and less restaurant, buffet, and fast food.  Limit fried foods.  Do not fry foods. Cook foods using methods such as baking, boiling, grilling, and broiling instead.  When eating at a restaurant, ask that your food be prepared with less salt, or no salt if possible. What foods can I eat? Seek help from a dietitian for individual calorie needs. Grains  Whole grain or whole wheat bread. Brown rice. Whole grain or whole wheat pasta. Quinoa, bulgur, and whole grain cereals. Low-sodium cereals. Corn or whole wheat flour tortillas. Whole grain cornbread. Whole grain crackers. Low-sodium crackers. Vegetables  Fresh or frozen vegetables (raw, steamed, roasted, or grilled). Low-sodium or reduced-sodium tomato and vegetable juices. Low-sodium or reduced-sodium tomato sauce and paste. Low-sodium or reduced-sodium canned vegetables. Fruits  All fresh, canned (in natural juice), or frozen fruits. Meat and Other Protein Products  Ground beef (85% or leaner), grass-fed beef, or beef trimmed of fat. Skinless chicken or [place]. Ground chicken or [place]. Pork trimmed of fat. All fish and seafood. Eggs. Dried beans, peas, or lentils. Unsalted nuts and seeds. Unsalted canned beans. Dairy  Low-fat dairy products, such as skim or 1% milk, 2% or reduced-fat cheeses, low-fat ricotta or cottage cheese, or plain low-fat yogurt. Low-sodium or reduced-sodium cheeses. Fats and Oils  Tub margarines without trans fats. Light or reduced-fat mayonnaise and salad dressings (reduced sodium). Avocado. Safflower, olive, or canola oils. Natural peanut or almond butter. Other  Unsalted popcorn and pretzels. The items listed above may not be a complete list of recommended foods or beverages. Contact your dietitian for more options.  What foods are not recommended? Grains  White bread. White  pasta. White rice. Refined cornbread. Bagels and croissants. Crackers that contain trans fat. Vegetables  Creamed or fried vegetables. Vegetables in a cheese sauce. Regular canned vegetables. Regular canned tomato sauce and paste. Regular tomato and vegetable juices. Fruits  Canned fruit in light or heavy syrup. Fruit juice. Meat and Other Protein Products  Fatty cuts of meat. Ribs, chicken wings, bacon, sausage, bologna, salami, chitterlings, fatback, hot dogs, bratwurst, and packaged luncheon meats. Salted nuts and seeds. Canned beans with salt. Dairy  Whole or 2% milk, cream, half-and-half, and cream cheese. Whole-fat or sweetened yogurt. Full-fat cheeses or blue cheese. Nondairy creamers and whipped toppings. Processed cheese, cheese spreads, or cheese curds. Condiments  Onion and garlic salt, seasoned salt, table salt, and sea salt. Canned and packaged gravies. Worcestershire sauce. Tartar sauce. Barbecue sauce. Teriyaki sauce. Soy sauce, including reduced sodium. Steak sauce. Fish sauce. Oyster sauce. Cocktail sauce. Horseradish. Ketchup and mustard. Meat flavorings and tenderizers. Bouillon cubes. Hot sauce. Tabasco sauce. Marinades. Taco seasonings. Relishes. Fats and Oils  Butter, stick margarine, lard, shortening, ghee, and bacon fat. Coconut, palm kernel, or palm oils. Regular salad dressings. Other  Pickles and olives. Salted popcorn and pretzels. The items listed above may not be a complete list of foods and beverages to avoid. Contact your dietitian for more information.  Where can I find more information? National Heart, Lung, and Blood Institute: travelstabloid.com This information is not intended to replace advice given to you by your health care provider. Make sure you discuss any questions you have with your health care provider. Document Released: 12/16/2010 Document Revised: 06/04/2015 Document Reviewed: 10/31/2012 Elsevier Interactive Patient  Education  2017 [employer].

## 2016-02-03 NOTE — Assessment & Plan Note (Signed)
USPSTF grade A and B recommendations reviewed with patient; age-appropriate recommendations, preventive care, screening tests, etc discussed and encouraged; healthy living encouraged; see AVS for patient education given to patient  

## 2016-02-03 NOTE — Assessment & Plan Note (Signed)
Discussed testing; will get PSA then DRE

## 2016-02-03 NOTE — Telephone Encounter (Signed)
02-03-16 L/M C#TO RETURN CALL TO SCHEDULE AN APPOINTMENT WITH DR Bary Castilla FOR SCREENING COLONOSCOPY(? ANY PRIOR)REF DR LADA.PT NOT IN PAL[facility]/BCBS/MTH

## 2016-02-03 NOTE — Assessment & Plan Note (Signed)
Start back on medicine; try DASH guidelines

## 2016-02-03 NOTE — Progress Notes (Signed)
Patient ID: Xavier [name], male   DOB: May 09, 1950, 66 y.o.   MRN: [id number]   Subjective:   Xavier [name] is a 66 y.o. male here for a complete physical exam  Interim issues since last visit: lost insurance; has been out of medicines for months; no statin, no BP meds, no aspirin  USPSTF grade A and B recommendations Alcohol: no Depression:  Depression screen [facility] 2/9 02/03/2016 03/10/2015 07/21/2014  Decreased Interest 0 0 0  Down, Depressed, Hopeless 0 0 0  PHQ - 2 Score 0 0 0  Hypertension: uncontrolled, off of meds for months  Obesity: no Tobacco use: no, exposed to passive smoke as a child  HIV, hep B, hep C:  STD testing and prevention (chl/gon/syphilis):  Lipids: check today Glucose: check today; brother has diabetes Colorectal cancer: order colonoscopy Prostate cancer screening: some slowed stream, father had colon and prostate cancer Breast cancer: no lumps Lung cancer: n/a Osteoporosis: n/a AAA: next year Aspirin: not taking aspirin Diet: typical American diet Exercise: stays active Skin cancer: nothing worrisome  Past Medical History:  Diagnosis Date  . History of CVA (cerebrovascular accident) 06/22/2011   Overview:  right sided stroke in 2013 (basal ganglia, right insular region, and white matter of the left frontal lobe   . Hyperlipidemia   . Hypertension    History reviewed. No pertinent surgical history. Family History  Problem Relation Age of Onset  . Arthritis Mother   . Heart disease Father    Social History  Substance Use Topics  . Smoking status: Never Smoker  . Smokeless tobacco: Never Used  . Alcohol use No   Review of Systems  Objective:   Vitals:   02/03/16 0817  BP: (!) 162/102  Pulse: 76  Resp: 14  Temp: 97.9 F (36.6 C)  TempSrc: Oral  SpO2: 97%  Weight: 147 lb 11.2 oz (67 kg)  Height: 5' 7.4" (1.712 m)   Body mass index is 22.86 kg/m. Wt Readings from Last 3 Encounters:  02/03/16 147 lb 11.2 oz (67 kg)   03/10/15 159 lb 3.2 oz (72.2 kg)  07/21/14 141 lb 1.6 oz (64 kg)   Physical Exam  Constitutional: He appears well-developed and well-nourished. No distress.  HENT:  Head: Normocephalic and atraumatic.  Nose: Nose normal.  Mouth/Throat: Oropharynx is clear and moist.  Eyes: EOM are normal. No scleral icterus.  Neck: No JVD present. Carotid bruit is present (LEFT). No thyromegaly present.  Cardiovascular: Normal rate, regular rhythm and normal heart sounds.   Pulmonary/Chest: Effort normal and breath sounds normal. No respiratory distress. He has no wheezes. He has no rales.  Abdominal: Soft. Bowel sounds are normal. He exhibits no distension. There is no tenderness. There is no guarding.  Genitourinary: Rectal exam shows no external hemorrhoid, no internal hemorrhoid, no fissure, no mass, no tenderness, anal tone normal and guaiac negative stool (NEGATIVE). Prostate is enlarged (very minimally enlarged; symmetric, no nodules). Prostate is not tender.  Musculoskeletal: Normal range of motion. He exhibits no edema.  Lymphadenopathy:    He has no cervical adenopathy.  Neurological: He is alert. He displays normal reflexes. He exhibits normal muscle tone. Coordination normal.  Skin: Skin is warm and dry. No rash noted. He is not diaphoretic. No erythema. No pallor.  Psychiatric: He has a normal mood and affect. His behavior is normal. Judgment and thought content normal. His mood appears not anxious. He does not exhibit a depressed mood.    Assessment/Plan:   Problem List  Items Addressed This Visit      Cardiovascular and Mediastinum   Hypertension goal BP (blood pressure) < 140/90    Start back on medicine; try DASH guidelines      Relevant Medications   losartan-hydrochlorothiazide (HYZAAR) 100-25 MG tablet   pravastatin (PRAVACHOL) 20 MG tablet   aspirin EC 81 MG tablet     Other   Prostate cancer screening    Discussed testing; will get PSA then DRE      Relevant Orders    PSA   Low serum thyroid stimulating hormone (TSH)    Recheck labs      Relevant Orders   TSH   Left carotid bruit    Order carotid US; start statin and aspirin      Relevant Orders   US Carotid Bilateral   History of CVA (cerebrovascular accident)    Get back on aspirin, statin; work to control BP, get back on medicine; carotid US ordered      Colon cancer screening    Refer to GI      Relevant Orders   Ambulatory referral to Gastroenterology   Annual physical exam - Primary    USPSTF grade A and B recommendations reviewed with patient; age-appropriate recommendations, preventive care, screening tests, etc discussed and encouraged; healthy living encouraged; see AVS for patient education given to patient       Relevant Orders   Lipid panel   CBC with Differential/Platelet   COMPLETE METABOLIC PANEL WITH GFR   TSH    Other Visit Diagnoses    Needs flu shot       Relevant Orders   Flu Vaccine QUAD 36+ mos PF IM (Fluarix & Fluzone Quad PF) (Completed)   Need for diphtheria-tetanus-pertussis (Tdap) vaccine       Relevant Orders   Tdap vaccine greater than or equal to 7yo IM (Completed)      Meds ordered this encounter  Medications  . losartan-hydrochlorothiazide (HYZAAR) 100-25 MG tablet    Sig: Take 1 tablet by mouth daily.    Dispense:  90 tablet    Refill:  1  . pravastatin (PRAVACHOL) 20 MG tablet    Sig: Take 1 tablet (20 mg total) by mouth at bedtime.    Dispense:  90 tablet    Refill:  1  . aspirin EC 81 MG tablet    Sig: Take 1 tablet (81 mg total) by mouth daily.   Orders Placed This Encounter  Procedures  . US Carotid Bilateral    Order Specific Question:   Reason for exam:    Answer:   carotid bruit LEFT; hx of stroke    Order Specific Question:   Preferred imaging location?    Answer:   [facility]  . Flu Vaccine QUAD 36+ mos PF IM (Fluarix & Fluzone Quad PF)  . Tdap vaccine greater than or equal to 7yo IM  . Lipid panel  . CBC with  Differential/Platelet  . COMPLETE METABOLIC PANEL WITH GFR  . PSA  . TSH  . Ambulatory referral to Gastroenterology    Referral Priority:   Routine    Referral Type:   Consultation    Referral Reason:   Specialty Services Required    Number of Visits Requested:   1    Follow up plan: Return in about 2 weeks (around 02/17/2016) for blood pressure recheck and visit with Dr. Sanda Klein.  An After Visit Summary was printed and given to the patient.

## 2016-02-03 NOTE — Assessment & Plan Note (Signed)
Check fasting labs today; encouraged him to limit saturated fats

## 2016-02-03 NOTE — Assessment & Plan Note (Signed)
Order carotid US; start statin and aspirin

## 2016-02-03 NOTE — Assessment & Plan Note (Signed)
Get back on aspirin, statin; work to control BP, get back on medicine; carotid US ordered

## 2016-02-03 NOTE — Assessment & Plan Note (Signed)
Recheck labs 

## 2016-02-03 NOTE — Telephone Encounter (Signed)
Pt is requesting his pharmacy be changed in our system. He will go today to get the prescriptions from [facility] but for the future he will be using [facility] phone # is [phone].

## 2016-02-03 NOTE — Assessment & Plan Note (Signed)
Refer to GI 

## 2016-02-04 ENCOUNTER — Encounter: Payer: Self-pay | Admitting: Family Medicine

## 2016-02-04 DIAGNOSIS — R739 Hyperglycemia, unspecified: Secondary | ICD-10-CM | POA: Insufficient documentation

## 2016-02-15 ENCOUNTER — Encounter: Payer: Self-pay | Admitting: General Surgery

## 2016-02-18 ENCOUNTER — Ambulatory Visit: Payer: BLUE CROSS/BLUE SHIELD | Admitting: Family Medicine

## 2016-02-23 ENCOUNTER — Encounter: Payer: Self-pay | Admitting: Family Medicine

## 2016-02-23 ENCOUNTER — Ambulatory Visit (INDEPENDENT_AMBULATORY_CARE_PROVIDER_SITE_OTHER): Payer: BLUE CROSS/BLUE SHIELD | Admitting: Family Medicine

## 2016-02-23 VITALS — BP 136/74 | HR 97 | Temp 98.1°F | Resp 14 | Wt 149.4 lb

## 2016-02-23 DIAGNOSIS — E785 Hyperlipidemia, unspecified: Secondary | ICD-10-CM | POA: Diagnosis not present

## 2016-02-23 DIAGNOSIS — I10 Essential (primary) hypertension: Secondary | ICD-10-CM | POA: Diagnosis not present

## 2016-02-23 DIAGNOSIS — R350 Frequency of micturition: Secondary | ICD-10-CM

## 2016-02-23 DIAGNOSIS — R739 Hyperglycemia, unspecified: Secondary | ICD-10-CM

## 2016-02-23 DIAGNOSIS — Z5181 Encounter for therapeutic drug level monitoring: Secondary | ICD-10-CM | POA: Diagnosis not present

## 2016-02-23 LAB — URINALYSIS W MICROSCOPIC + REFLEX CULTURE
BILIRUBIN URINE: NEGATIVE
Casts: NONE SEEN [LPF]
Crystals: NONE SEEN [HPF]
GLUCOSE, UA: NEGATIVE
Ketones, ur: NEGATIVE
Nitrite: NEGATIVE
Protein, ur: NEGATIVE
Specific Gravity, Urine: 1.02 (ref 1.001–1.035)
Yeast: NONE SEEN [HPF]
pH: 6.5 (ref 5.0–8.0)

## 2016-02-23 LAB — BASIC METABOLIC PANEL WITH GFR
BUN: 21 mg/dL (ref 7–25)
CHLORIDE: 103 mmol/L (ref 98–110)
CO2: 26 mmol/L (ref 20–31)
CREATININE: 1.14 mg/dL (ref 0.70–1.25)
Calcium: 9.2 mg/dL (ref 8.6–10.3)
GFR, Est African American: 78 mL/min (ref >=60)
GFR, Est Non African American: 68 mL/min (ref >=60)
Glucose, Bld: 91 mg/dL (ref 65–99)
Potassium: 4.3 mmol/L (ref 3.5–5.3)
SODIUM: 138 mmol/L (ref 135–146)

## 2016-02-23 NOTE — Patient Instructions (Addendum)
Return on or after April 25th just for labs for cholesterol and thyroid Continue the great job you are doing with taking your medicines and watching your diet We'll contact you about the lab results Try vitamin C (orange juice if not diabetic or vitamin C tablets) and drink green tea to help your immune system during your illness Get plenty of rest and hydration  DASH Eating Plan DASH stands for "Dietary Approaches to Stop Hypertension." The DASH eating plan is a healthy eating plan that has been shown to reduce high blood pressure (hypertension). Additional health benefits may include reducing the risk of type 2 diabetes mellitus, heart disease, and stroke. The DASH eating plan may also help with weight loss. What do I need to know about the DASH eating plan? For the DASH eating plan, you will follow these general guidelines:  Choose foods with less than 150 milligrams of sodium per serving (as listed on the food label).  Use salt-free seasonings or herbs instead of table salt or sea salt.  Check with your health care provider or pharmacist before using salt substitutes.  Eat lower-sodium products. These are often labeled as "low-sodium" or "no salt added."  Eat fresh foods. Avoid eating a lot of canned foods.  Eat more vegetables, fruits, and low-fat dairy products.  Choose whole grains. Look for the word "whole" as the first word in the ingredient list.  Choose fish and skinless chicken or [place] more often than red meat. Limit fish, poultry, and meat to 6 oz (170 g) each day.  Limit sweets, desserts, sugars, and sugary drinks.  Choose heart-healthy fats.  Eat more home-cooked food and less restaurant, buffet, and fast food.  Limit fried foods.  Do not fry foods. Cook foods using methods such as baking, boiling, grilling, and broiling instead.  When eating at a restaurant, ask that your food be prepared with less salt, or no salt if possible. What foods can I eat? Seek help  from a dietitian for individual calorie needs. Grains  Whole grain or whole wheat bread. Brown rice. Whole grain or whole wheat pasta. Quinoa, bulgur, and whole grain cereals. Low-sodium cereals. Corn or whole wheat flour tortillas. Whole grain cornbread. Whole grain crackers. Low-sodium crackers. Vegetables  Fresh or frozen vegetables (raw, steamed, roasted, or grilled). Low-sodium or reduced-sodium tomato and vegetable juices. Low-sodium or reduced-sodium tomato sauce and paste. Low-sodium or reduced-sodium canned vegetables. Fruits  All fresh, canned (in natural juice), or frozen fruits. Meat and Other Protein Products  Ground beef (85% or leaner), grass-fed beef, or beef trimmed of fat. Skinless chicken or [place]. Ground chicken or [place]. Pork trimmed of fat. All fish and seafood. Eggs. Dried beans, peas, or lentils. Unsalted nuts and seeds. Unsalted canned beans. Dairy  Low-fat dairy products, such as skim or 1% milk, 2% or reduced-fat cheeses, low-fat ricotta or cottage cheese, or plain low-fat yogurt. Low-sodium or reduced-sodium cheeses. Fats and Oils  Tub margarines without trans fats. Light or reduced-fat mayonnaise and salad dressings (reduced sodium). Avocado. Safflower, olive, or canola oils. Natural peanut or almond butter. Other  Unsalted popcorn and pretzels. The items listed above may not be a complete list of recommended foods or beverages. Contact your dietitian for more options.  What foods are not recommended? Grains  White bread. White pasta. White rice. Refined cornbread. Bagels and croissants. Crackers that contain trans fat. Vegetables  Creamed or fried vegetables. Vegetables in a cheese sauce. Regular canned vegetables. Regular canned tomato sauce and paste. Regular  tomato and vegetable juices. Fruits  Canned fruit in light or heavy syrup. Fruit juice. Meat and Other Protein Products  Fatty cuts of meat. Ribs, chicken wings, bacon, sausage, bologna, salami,  chitterlings, fatback, hot dogs, bratwurst, and packaged luncheon meats. Salted nuts and seeds. Canned beans with salt. Dairy  Whole or 2% milk, cream, half-and-half, and cream cheese. Whole-fat or sweetened yogurt. Full-fat cheeses or blue cheese. Nondairy creamers and whipped toppings. Processed cheese, cheese spreads, or cheese curds. Condiments  Onion and garlic salt, seasoned salt, table salt, and sea salt. Canned and packaged gravies. Worcestershire sauce. Tartar sauce. Barbecue sauce. Teriyaki sauce. Soy sauce, including reduced sodium. Steak sauce. Fish sauce. Oyster sauce. Cocktail sauce. Horseradish. Ketchup and mustard. Meat flavorings and tenderizers. Bouillon cubes. Hot sauce. Tabasco sauce. Marinades. Taco seasonings. Relishes. Fats and Oils  Butter, stick margarine, lard, shortening, ghee, and bacon fat. Coconut, palm kernel, or palm oils. Regular salad dressings. Other  Pickles and olives. Salted popcorn and pretzels. The items listed above may not be a complete list of foods and beverages to avoid. Contact your dietitian for more information.  Where can I find more information? National Heart, Lung, and Blood Institute: travelstabloid.com This information is not intended to replace advice given to you by your health care provider. Make sure you discuss any questions you have with your health care provider. Document Released: 12/16/2010 Document Revised: 06/04/2015 Document Reviewed: 10/31/2012 Elsevier Interactive Patient Education  2017 [employer].

## 2016-02-23 NOTE — Assessment & Plan Note (Signed)
Will get glucose and A1c today; avoid white bread

## 2016-02-23 NOTE — Assessment & Plan Note (Signed)
Well-controlled on the new medicine, no side effects; check Cr and K+

## 2016-02-23 NOTE — Assessment & Plan Note (Signed)
Check fasting labs for cholesterol in another few months; keep trying to eat healthy

## 2016-02-23 NOTE — Progress Notes (Signed)
BP 136/74 (BP Location: Right Arm)   Pulse 97   Temp 98.1 F (36.7 C) (Oral)   Resp 14   Wt 149 lb 6 oz (67.8 kg)   SpO2 99%   BMI 23.12 kg/m    Subjective:    Patient ID: Xavier [name], male    DOB: [birth year], 66 y.o.   MRN: [id number]  HPI: Xavier [name] is a 66 y.o. male  Chief Complaint  Patient presents with  . Hypertension    No dizziness, headaches, chest pain but only shortness breath when walking long period of time  . Immunizations    Questions about shingle shot   He is here for BP follow-up; last BP was 162/102 No dizzines, no headaches, no chest pain Just a little SHOB if really exerting himself  Feels a little sick; started yesterday; low grade fever; sinuses are bothering him; R=L; blowing some stuff out, postnasal drip; yellowish drainage; no pain over upper jaw; ringing in the ears; no drainage; no rash; no sore throat; joints bother him, left ankle and knee, but no overall body aches  High glucose noted on previous labs; older brother has diabetes, started around age 49; last thing to eat or drink was a biscuit and coffee; urinary frequency  High cholesterol; taking medicine at bedtime; not much bacon or sausaage  Depression screen [facility] 2/9 02/23/2016 02/03/2016 03/10/2015 07/21/2014  Decreased Interest 0 0 0 0  Down, Depressed, Hopeless 0 0 0 0  PHQ - 2 Score 0 0 0 0   Relevant past medical, surgical, family and social history reviewed Past Medical History:  Diagnosis Date  . History of CVA (cerebrovascular accident) 06/22/2011   Overview:  right sided stroke in 2013 (basal ganglia, right insular region, and white matter of the left frontal lobe   . Hyperlipidemia   . Hypertension    Past Surgical History:  Procedure Laterality Date  . TONSILLECTOMY  1958   MD note: s/p tonsillectomy at age 39; updated  Family History  Problem Relation Age of Onset  . Arthritis Mother   . Kidney disease Mother   . Heart disease Father   . Diabetes  Brother   MD note: mother had kidney disease, brother has diabetes; updated  Social History  Substance Use Topics  . Smoking status: Never Smoker  . Smokeless tobacco: Never Used  . Alcohol use No    Interim medical history since last visit reviewed. Allergies and medications reviewed  Review of Systems Per HPI unless specifically indicated above     Objective:    BP 136/74 (BP Location: Right Arm)   Pulse 97   Temp 98.1 F (36.7 C) (Oral)   Resp 14   Wt 149 lb 6 oz (67.8 kg)   SpO2 99%   BMI 23.12 kg/m   Wt Readings from Last 3 Encounters:  02/23/16 149 lb 6 oz (67.8 kg)  02/03/16 147 lb 11.2 oz (67 kg)  03/10/15 159 lb 3.2 oz (72.2 kg)    Physical Exam  Constitutional: He appears well-developed and well-nourished. No distress.  Eyes: No scleral icterus.  Cardiovascular: Normal rate and regular rhythm.   Pulmonary/Chest: Effort normal and breath sounds normal.  Musculoskeletal: He exhibits no edema.  Neurological: He is alert.  Psychiatric: He has a normal mood and affect.      Assessment & Plan:   Problem List Items Addressed This Visit      Cardiovascular and Mediastinum   Hypertension goal BP (blood  pressure) < 140/90 - Primary    Well-controlled on the new medicine, no side effects; check Cr and K+        Other   Medication monitoring encounter    Check Cr and K+      Relevant Orders   BASIC METABOLIC PANEL WITH GFR (Completed)   Hyperlipidemia LDL goal <100    Check fasting labs for cholesterol in another few months; keep trying to eat healthy      Hyperglycemia    Will get glucose and A1c today; avoid white bread      Relevant Orders   Hemoglobin A1c (Completed)    Other Visit Diagnoses    Urine frequency       Relevant Orders   Urinalysis w microscopic + reflex cultur (Completed)      Follow up plan: Return in about 2 months (around 05/04/2016) for fasting labs and visit, or just AFTER.  An after-visit summary was printed and  given to the patient at [facility].  Please see the patient instructions which may contain other information and recommendations beyond what is mentioned above in the assessment and plan.  No orders of the defined types were placed in this encounter.   Orders Placed This Encounter  Procedures  . Urine culture  . BASIC METABOLIC PANEL WITH GFR  . Hemoglobin A1c  . Urinalysis w microscopic + reflex cultur

## 2016-02-23 NOTE — Assessment & Plan Note (Signed)
Check Cr and K+ 

## 2016-02-24 ENCOUNTER — Other Ambulatory Visit: Payer: Self-pay | Admitting: Family Medicine

## 2016-02-24 DIAGNOSIS — N3000 Acute cystitis without hematuria: Secondary | ICD-10-CM

## 2016-02-24 LAB — HEMOGLOBIN A1C
Hgb A1c MFr Bld: 5.5 % (ref <5.7)
MEAN PLASMA GLUCOSE: 111 mg/dL

## 2016-02-24 MED ORDER — SULFAMETHOXAZOLE-TRIMETHOPRIM 800-160 MG PO TABS: 1.0000 | ORAL_TABLET | Freq: Two times a day (BID) | ORAL | 0 refills | Status: AC

## 2016-02-24 NOTE — Progress Notes (Signed)
Start antibiotics; recheck urine in 3 weeks

## 2016-02-27 LAB — URINE CULTURE

## 2016-03-01 ENCOUNTER — Encounter: Payer: Self-pay | Admitting: Family Medicine

## 2016-03-18 ENCOUNTER — Encounter: Payer: Self-pay | Admitting: Family Medicine

## 2016-03-18 ENCOUNTER — Ambulatory Visit (INDEPENDENT_AMBULATORY_CARE_PROVIDER_SITE_OTHER): Payer: BLUE CROSS/BLUE SHIELD | Admitting: Family Medicine

## 2016-03-18 VITALS — BP 128/74 | HR 88 | Temp 98.0°F | Resp 16 | Wt 149.4 lb

## 2016-03-18 DIAGNOSIS — Z1211 Encounter for screening for malignant neoplasm of colon: Secondary | ICD-10-CM

## 2016-03-18 DIAGNOSIS — J0100 Acute maxillary sinusitis, unspecified: Secondary | ICD-10-CM | POA: Diagnosis not present

## 2016-03-18 MED ORDER — AMOXICILLIN-POT CLAVULANATE 875-125 MG PO TABS: 1.0000 | ORAL_TABLET | Freq: Two times a day (BID) | ORAL | 0 refills | Status: DC

## 2016-03-18 NOTE — Progress Notes (Signed)
BP 128/74   Pulse 88   Temp 98 F (36.7 C) (Oral)   Resp 16   Wt 149 lb 6.4 oz (67.8 kg)   SpO2 97%   BMI 23.12 kg/m    Subjective:    Patient ID: Xavier [name], male    DOB: January 27, 1950, 66 y.o.   MRN: [id number]  HPI: Xavier [name] is a 66 y.o. male  Chief Complaint  Patient presents with  . Sinusitis    nasal congestion,dizziness   Patient is here for an acute visit Started like a regular cold about 12 days ago; got worse and worse Stuffiness and head congestion Some fever Left and right equal Sneezing a lot Little cough, very little sputum Ears are ringing No travel No visits to hospitals or nursing homes recently  Depression screen [facility] 2/9 03/18/2016 02/23/2016 02/03/2016 03/10/2015 07/21/2014  Decreased Interest 0 0 0 0 0  Down, Depressed, Hopeless 0 0 0 0 0  PHQ - 2 Score 0 0 0 0 0   Relevant past medical, surgical, family and social history reviewed Past Medical History:  Diagnosis Date  . History of CVA (cerebrovascular accident) 06/22/2011   Overview:  right sided stroke in 2013 (basal ganglia, right insular region, and white matter of the left frontal lobe   . Hyperlipidemia   . Hypertension    Past Surgical History:  Procedure Laterality Date  . TONSILLECTOMY  1958   Social History  Substance Use Topics  . Smoking status: Never Smoker  . Smokeless tobacco: Never Used  . Alcohol use No   Interim medical history since last visit reviewed. Allergies and medications reviewed  Review of Systems Per HPI unless specifically indicated above     Objective:    BP 128/74   Pulse 88   Temp 98 F (36.7 C) (Oral)   Resp 16   Wt 149 lb 6.4 oz (67.8 kg)   SpO2 97%   BMI 23.12 kg/m   Wt Readings from Last 3 Encounters:  03/18/16 149 lb 6.4 oz (67.8 kg)  02/23/16 149 lb 6 oz (67.8 kg)  02/03/16 147 lb 11.2 oz (67 kg)    Physical Exam  Constitutional: He appears well-developed and well-nourished. No distress.  HENT:  Right Ear: Tympanic  membrane, external ear and ear canal normal. Tympanic membrane is not erythematous. No middle ear effusion.  Left Ear: Tympanic membrane, external ear and ear canal normal. Tympanic membrane is not erythematous.  No middle ear effusion.  Nose: Mucosal edema and rhinorrhea present.  Mouth/Throat: Oropharynx is clear and moist and mucous membranes are normal.  Lymphadenopathy:    He has no cervical adenopathy.  Skin: He is not diaphoretic.       Assessment & Plan:   Problem List Items Addressed This Visit    None    Visit Diagnoses    Acute non-recurrent maxillary sinusitis    -  Primary   will treat with amox/clav, rest, hydration, saline; reasons to call reviewed; discussed risk of C diff, see AVS   Relevant Medications   amoxicillin-clavulanate (AUGMENTIN) 875-125 MG tablet   Screen for colon cancer       Relevant Orders   Ambulatory referral to Gastroenterology       Follow up plan: No Follow-up on file.  An after-visit summary was printed and given to the patient at [facility].  Please see the patient instructions which may contain other information and recommendations beyond what is mentioned above in the assessment  and plan.  Meds ordered this encounter  Medications  . amoxicillin-clavulanate (AUGMENTIN) 875-125 MG tablet    Sig: Take 1 tablet by mouth 2 (two) times daily.    Dispense:  20 tablet    Refill:  0    Orders Placed This Encounter  Procedures  . Ambulatory referral to Gastroenterology

## 2016-03-18 NOTE — Patient Instructions (Addendum)
Try vitamin C (orange juice if not diabetic or vitamin C tablets) and drink green tea to help your immune system during your illness Get plenty of rest and hydration Start the antibiotic Please do eat yogurt daily or take a probiotic daily for the next month We want to replace the healthy germs in the gut If you notice foul, watery diarrhea in the next two months, schedule an appointment RIGHT AWAY  Sinusitis, Adult Sinusitis is soreness and inflammation of your sinuses. Sinuses are hollow spaces in the bones around your face. They are located:  Around your eyes.  In the middle of your forehead.  Behind your nose.  In your cheekbones. Your sinuses and nasal passages are lined with a stringy fluid (mucus). Mucus normally drains out of your sinuses. When your nasal tissues get inflamed or swollen, the mucus can get trapped or blocked so air cannot flow through your sinuses. This lets bacteria, viruses, and funguses grow, and that leads to infection. Follow these instructions at home: Medicines   Take, use, or apply over-the-counter and prescription medicines only as told by your doctor. These may include nasal sprays.  If you were prescribed an antibiotic medicine, take it as told by your doctor. Do not stop taking the antibiotic even if you start to feel better. Hydrate and Humidify   Drink enough water to keep your pee (urine) clear or pale yellow.  Use a cool mist humidifier to keep the humidity level in your home above 50%.  Breathe in steam for 10-15 minutes, 3-4 times a day or as told by your doctor. You can do this in the bathroom while a hot shower is running.  Try not to spend time in cool or dry air. Rest   Rest as much as possible.  Sleep with your head raised (elevated).  Make sure to get enough sleep each night. General instructions   Put a warm, moist washcloth on your face 3-4 times a day or as told by your doctor. This will help with discomfort.  Wash your  hands often with soap and water. If there is no soap and water, use hand sanitizer.  Do not smoke. Avoid being around people who are smoking (secondhand smoke).  Keep all follow-up visits as told by your doctor. This is important. Contact a doctor if:  You have a fever.  Your symptoms get worse.  Your symptoms do not get better within 10 days. Get help right away if:  You have a very bad headache.  You cannot stop throwing up (vomiting).  You have pain or swelling around your face or eyes.  You have trouble seeing.  You feel confused.  Your neck is stiff.  You have trouble breathing. This information is not intended to replace advice given to you by your health care provider. Make sure you discuss any questions you have with your health care provider. Document Released: 06/15/2007 Document Revised: 08/23/2015 Document Reviewed: 10/22/2014 Elsevier Interactive Patient Education  2017 [employer].

## 2016-05-03 ENCOUNTER — Ambulatory Visit: Payer: BLUE CROSS/BLUE SHIELD | Admitting: Family Medicine

## 2016-05-16 ENCOUNTER — Other Ambulatory Visit: Payer: Self-pay | Admitting: Family Medicine

## 2016-05-16 NOTE — Telephone Encounter (Signed)
Patient should not be out of losartan-hctz; please resolve with pharmacy Please remind patient to return for urine recheck Thank you

## 2016-05-16 NOTE — Telephone Encounter (Signed)
Patient requesting refill of Losartan-HCTZ to [facility].

## 2016-05-16 NOTE — Telephone Encounter (Signed)
Already spoke with the pharmacy.  And left voicemail with patient.

## 2016-05-18 NOTE — Telephone Encounter (Signed)
I have already spoke to the pharmacy about this.

## 2016-05-18 NOTE — Telephone Encounter (Signed)
He shouldn't be out; I approved a 6 month supply in January 2018

## 2016-05-30 ENCOUNTER — Telehealth: Payer: Self-pay | Admitting: Family Medicine

## 2016-05-30 NOTE — Telephone Encounter (Signed)
Left detailed voicemail, that he has refills he just needs to let pharmacy now he needs refilled early and he may have to pay out of pocket since it early.

## 2016-05-30 NOTE — Telephone Encounter (Signed)
Pt has lost his medication in the process of moving and is asking that you give him another refill on losarta-hydrochlorothiazide and pravastatin. Please send to [facility] [phone]

## 2016-05-31 ENCOUNTER — Other Ambulatory Visit: Payer: Self-pay | Admitting: Family Medicine

## 2016-06-21 ENCOUNTER — Encounter: Payer: Self-pay | Admitting: Family Medicine

## 2016-06-21 ENCOUNTER — Ambulatory Visit (INDEPENDENT_AMBULATORY_CARE_PROVIDER_SITE_OTHER): Payer: BLUE CROSS/BLUE SHIELD | Admitting: Family Medicine

## 2016-06-21 VITALS — BP 156/82 | HR 89 | Temp 98.3°F | Resp 14 | Wt 149.6 lb

## 2016-06-21 DIAGNOSIS — Z1211 Encounter for screening for malignant neoplasm of colon: Secondary | ICD-10-CM | POA: Diagnosis not present

## 2016-06-21 DIAGNOSIS — K429 Umbilical hernia without obstruction or gangrene: Secondary | ICD-10-CM | POA: Diagnosis not present

## 2016-06-21 DIAGNOSIS — Z800 Family history of malignant neoplasm of digestive organs: Secondary | ICD-10-CM | POA: Diagnosis not present

## 2016-06-21 DIAGNOSIS — R0989 Other specified symptoms and signs involving the circulatory and respiratory systems: Secondary | ICD-10-CM

## 2016-06-21 DIAGNOSIS — I10 Essential (primary) hypertension: Secondary | ICD-10-CM | POA: Diagnosis not present

## 2016-06-21 DIAGNOSIS — Z23 Encounter for immunization: Secondary | ICD-10-CM | POA: Diagnosis not present

## 2016-06-21 HISTORY — DX: Family history of malignant neoplasm of digestive organs: Z80.0

## 2016-06-21 MED ORDER — AMOXICILLIN 875 MG PO TABS: 875.0000 mg | ORAL_TABLET | Freq: Two times a day (BID) | ORAL | 0 refills | Status: AC

## 2016-06-21 MED ORDER — LOSARTAN POTASSIUM-HCTZ 100-25 MG PO TABS: 1.0000 | ORAL_TABLET | Freq: Every day | ORAL | 6 refills | Status: DC

## 2016-06-21 NOTE — Assessment & Plan Note (Signed)
Referred patient for colonoscopy in January, but did not happen; will re-refer; father had colon cancer

## 2016-06-21 NOTE — Patient Instructions (Addendum)
Please do start back on your blood pressure medicine Try to limit saturated fats in your diet (bologna, hot dogs, barbeque, cheeseburgers, hamburgers, steak, bacon, sausage, cheese, etc.) and get more fresh fruits, vegetables, and whole grains We'll have you see the surgeon about your hernia and getting a colonoscopy scheduled Start the antibiotics Please do eat yogurt daily or take a probiotic daily for the next month We want to replace the healthy germs in the gut If you notice foul, watery diarrhea in the next two months, schedule an appointment RIGHT AWAY Return in August for fasting labs only Return to see Dr. Sanda Klein in 6 months  Try to follow the DASH guidelines (DASH stands for Dietary Approaches to Stop Hypertension) Try to limit the sodium in your diet.  Ideally, consume less than 1.5 grams (less than 1,500mg ) per day. Do not add salt when cooking or at the table.  Check the sodium amount on labels when shopping, and choose items lower in sodium when given a choice. Avoid or limit foods that already contain a lot of sodium. Eat a diet rich in fruits and vegetables and whole grains.  DASH Eating Plan DASH stands for "Dietary Approaches to Stop Hypertension." The DASH eating plan is a healthy eating plan that has been shown to reduce high blood pressure (hypertension). It may also reduce your risk for type 2 diabetes, heart disease, and stroke. The DASH eating plan may also help with weight loss. What are tips for following this plan? General guidelines  Avoid eating more than 2,300 mg (milligrams) of salt (sodium) a day. If you have hypertension, you may need to reduce your sodium intake to 1,500 mg a day.  Limit alcohol intake to no more than 1 drink a day for nonpregnant women and 2 drinks a day for men. One drink equals 12 oz of beer, 5 oz of wine, or 1 oz of hard liquor.  Work with your health care provider to maintain a healthy body weight or to lose weight. Ask what an ideal  weight is for you.  Get at least 30 minutes of exercise that causes your heart to beat faster (aerobic exercise) most days of the week. Activities may include walking, swimming, or biking.  Work with your health care provider or diet and nutrition specialist (dietitian) to adjust your eating plan to your individual calorie needs. Reading food labels  Check food labels for the amount of sodium per serving. Choose foods with less than 5 percent of the Daily Value of sodium. Generally, foods with less than 300 mg of sodium per serving fit into this eating plan.  To find whole grains, look for the word "whole" as the first word in the ingredient list. Shopping  Buy products labeled as "low-sodium" or "no salt added."  Buy fresh foods. Avoid canned foods and premade or frozen meals. Cooking  Avoid adding salt when cooking. Use salt-free seasonings or herbs instead of table salt or sea salt. Check with your health care provider or pharmacist before using salt substitutes.  Do not fry foods. Cook foods using healthy methods such as baking, boiling, grilling, and broiling instead.  Cook with heart-healthy oils, such as olive, canola, soybean, or sunflower oil. Meal planning   Eat a balanced diet that includes: ? 5 or more servings of fruits and vegetables each day. At each meal, try to fill half of your plate with fruits and vegetables. ? Up to 6-8 servings of whole grains each day. ? Less than  6 oz of lean meat, poultry, or fish each day. A 3-oz serving of meat is about the same size as a deck of cards. One egg equals 1 oz. ? 2 servings of low-fat dairy each day. ? A serving of nuts, seeds, or beans 5 times each week. ? Heart-healthy fats. Healthy fats called Omega-3 fatty acids are found in foods such as flaxseeds and coldwater fish, like sardines, salmon, and mackerel.  Limit how much you eat of the following: ? Canned or prepackaged foods. ? Food that is high in trans fat, such as  fried foods. ? Food that is high in saturated fat, such as fatty meat. ? Sweets, desserts, sugary drinks, and other foods with added sugar. ? Full-fat dairy products.  Do not salt foods before eating.  Try to eat at least 2 vegetarian meals each week.  Eat more home-cooked food and less restaurant, buffet, and fast food.  When eating at a restaurant, ask that your food be prepared with less salt or no salt, if possible. What foods are recommended? The items listed may not be a complete list. Talk with your dietitian about what dietary choices are best for you. Grains Whole-grain or whole-wheat bread. Whole-grain or whole-wheat pasta. Brown rice. [place]. Bulgur. Whole-grain and low-sodium cereals. Pita bread. Low-fat, low-sodium crackers. Whole-wheat flour tortillas. Vegetables Fresh or frozen vegetables (raw, steamed, roasted, or grilled). Low-sodium or reduced-sodium tomato and vegetable juice. Low-sodium or reduced-sodium tomato sauce and tomato paste. Low-sodium or reduced-sodium canned vegetables. Fruits All fresh, dried, or frozen fruit. Canned fruit in natural juice (without added sugar). Meat and other protein foods Skinless chicken or [place]. Ground chicken or [place]. Pork with fat trimmed off. Fish and seafood. Egg whites. Dried beans, peas, or lentils. Unsalted nuts, nut butters, and seeds. Unsalted canned beans. Lean cuts of beef with fat trimmed off. Low-sodium, lean deli meat. Dairy Low-fat (1%) or fat-free (skim) milk. Fat-free, low-fat, or reduced-fat cheeses. Nonfat, low-sodium ricotta or cottage cheese. Low-fat or nonfat yogurt. Low-fat, low-sodium cheese. Fats and oils Soft margarine without trans fats. Vegetable oil. Low-fat, reduced-fat, or light mayonnaise and salad dressings (reduced-sodium). Canola, safflower, olive, soybean, and sunflower oils. Avocado. Seasoning and other foods Herbs. Spices. Seasoning mixes without salt. Unsalted popcorn and pretzels.  Fat-free sweets. What foods are not recommended? The items listed may not be a complete list. Talk with your dietitian about what dietary choices are best for you. Grains Baked goods made with fat, such as croissants, muffins, or some breads. Dry pasta or rice meal packs. Vegetables Creamed or fried vegetables. Vegetables in a cheese sauce. Regular canned vegetables (not low-sodium or reduced-sodium). Regular canned tomato sauce and paste (not low-sodium or reduced-sodium). Regular tomato and vegetable juice (not low-sodium or reduced-sodium). Angie Fava. Olives. Fruits Canned fruit in a light or heavy syrup. Fried fruit. Fruit in cream or butter sauce. Meat and other protein foods Fatty cuts of meat. Ribs. Fried meat. Berniece Salines. Sausage. Bologna and other processed lunch meats. Salami. Fatback. Hotdogs. Bratwurst. Salted nuts and seeds. Canned beans with added salt. Canned or smoked fish. Whole eggs or egg yolks. Chicken or [place] with skin. Dairy Whole or 2% milk, cream, and half-and-half. Whole or full-fat cream cheese. Whole-fat or sweetened yogurt. Full-fat cheese. Nondairy creamers. Whipped toppings. Processed cheese and cheese spreads. Fats and oils Butter. Stick margarine. Lard. Shortening. Ghee. Bacon fat. Tropical oils, such as coconut, palm kernel, or palm oil. Seasoning and other foods Salted popcorn and pretzels. Onion salt, garlic salt,  seasoned salt, table salt, and sea salt. Worcestershire sauce. Tartar sauce. Barbecue sauce. Teriyaki sauce. Soy sauce, including reduced-sodium. Steak sauce. Canned and packaged gravies. Fish sauce. Oyster sauce. Cocktail sauce. Horseradish that you find on the shelf. Ketchup. Mustard. Meat flavorings and tenderizers. Bouillon cubes. Hot sauce and Tabasco sauce. Premade or packaged marinades. Premade or packaged taco seasonings. Relishes. Regular salad dressings. Where to find more information:  National Heart, Lung, and [place]:  https://wilson-eaton.com/  American Heart Association: www.heart.org Summary  The DASH eating plan is a healthy eating plan that has been shown to reduce high blood pressure (hypertension). It may also reduce your risk for type 2 diabetes, heart disease, and stroke.  With the DASH eating plan, you should limit salt (sodium) intake to 2,300 mg a day. If you have hypertension, you may need to reduce your sodium intake to 1,500 mg a day.  When on the DASH eating plan, aim to eat more fresh fruits and vegetables, whole grains, lean proteins, low-fat dairy, and heart-healthy fats.  Work with your health care provider or diet and nutrition specialist (dietitian) to adjust your eating plan to your individual calorie needs. This information is not intended to replace advice given to you by your health care provider. Make sure you discuss any questions you have with your health care provider. Document Released: 12/16/2010 Document Revised: 12/21/2015 Document Reviewed: 12/21/2015 Elsevier Interactive Patient Education  2017 [employer].

## 2016-06-21 NOTE — Assessment & Plan Note (Signed)
Refer to general surgery.

## 2016-06-21 NOTE — Progress Notes (Signed)
BP (!) 156/82   Pulse 89   Temp 98.3 F (36.8 C) (Oral)   Resp 14   Wt 149 lb 9.6 oz (67.9 kg)   SpO2 96%   BMI 23.15 kg/m    Subjective:    Patient ID: Xavier [name], male    DOB: [birth year], 66 y.o.   MRN: [id number]  HPI: Xavier [name] is a 66 y.o. male  Chief Complaint  Patient presents with  . Follow-up  . Medication Refill  . Ear Fullness    with ringing    HPI Patient is here for f/u  Father had colon cancer around 70 years old; patient is willing to have colonoscopy; no abd pain, no blood in the stool, no change in caliber  Patient has a hernia of the belly button; there about a year; worse with straining; pops out and he can mash it right back in; older brother had a hernia  He has ringing in the ears; left side worse than the right; going on since he went without his BP medicines  He had HTN; out of medicine for 2-1/2 weeks; I had approved enough until late July, but must have been a mix up at the pharmacy; 90+1 confirmed in our system 02/03/16; he denied taking too much  High cholesterol; taking statin; no muscle aches; thinking about changing diet  Hx of prediabetes; previous A1c 5.8 one year ago, but last was normal at 5.5  Depression screen [facility] 2/9 06/21/2016 03/18/2016 02/23/2016 02/03/2016 03/10/2015  Decreased Interest 0 0 0 0 0  Down, Depressed, Hopeless 0 0 0 0 0  PHQ - 2 Score 0 0 0 0 0    Relevant past medical, surgical, family and social history reviewed Past Medical History:  Diagnosis Date  . Family hx of colon cancer requiring screening colonoscopy 06/21/2016   Father at age 33  . History of CVA (cerebrovascular accident) 06/22/2011   Overview:  right sided stroke in 2013 (basal ganglia, right insular region, and white matter of the left frontal lobe   . Hyperlipidemia   . Hypertension    Past Surgical History:  Procedure Laterality Date  . TONSILLECTOMY  1958   Family History  Problem Relation Age of Onset  . Arthritis  Mother   . Kidney disease Mother   . Heart disease Father   . Cancer Father        lung  . Diabetes Brother   . Cancer Brother        stomach tumor  . Glaucoma Maternal Grandmother   . Kidney disease Paternal Grandmother   . Neuropathy Brother    Social History   Social History  . Marital status: Married    Spouse name: N/A  . Number of children: N/A  . Years of education: N/A   Occupational History  . Not on file.   Social History Main Topics  . Smoking status: Never Smoker  . Smokeless tobacco: Never Used  . Alcohol use No  . Drug use: No  . Sexual activity: Yes   Other Topics Concern  . Not on file   Social History Narrative  . No narrative on file    Interim medical history since last visit reviewed. Allergies and medications reviewed  Review of Systems Per HPI unless specifically indicated above     Objective:    BP (!) 156/82   Pulse 89   Temp 98.3 F (36.8 C) (Oral)   Resp 14   Wt 149  lb 9.6 oz (67.9 kg)   SpO2 96%   BMI 23.15 kg/m   Wt Readings from Last 3 Encounters:  06/21/16 149 lb 9.6 oz (67.9 kg)  03/18/16 149 lb 6.4 oz (67.8 kg)  02/23/16 149 lb 6 oz (67.8 kg)    Physical Exam  Constitutional: He appears well-developed and well-nourished. No distress.  HENT:  Right Ear: Tympanic membrane, external ear and ear canal normal. Tympanic membrane is not erythematous. No middle ear effusion.  Left Ear: Tympanic membrane, external ear and ear canal normal. Tympanic membrane is not erythematous.  No middle ear effusion.  Nose: Mucosal edema and rhinorrhea present.  Mouth/Throat: Oropharynx is clear and moist and mucous membranes are normal.  Cardiovascular: Normal rate and regular rhythm.   Pulmonary/Chest: Effort normal and breath sounds normal.  Abdominal: Soft. Bowel sounds are normal. He exhibits no distension. A hernia (umbilical) is present.  Lymphadenopathy:    He has no cervical adenopathy.  Skin: He is not diaphoretic. No pallor.    Psychiatric: His mood appears not anxious. He does not exhibit a depressed mood.       Assessment & Plan:   Problem List Items Addressed This Visit      Cardiovascular and Mediastinum   Hypertension goal BP (blood pressure) < 140/90    Not to goal; discussed options with patient; he will start back on his antihypertensive; DASH guidelines encouraged      Relevant Medications   losartan-hydrochlorothiazide (HYZAAR) 100-25 MG tablet     Other   Umbilical hernia without obstruction and without gangrene - Primary    Refer to general surgeon for evaluation; reasons to go to ER reviewed      Relevant Orders   Ambulatory referral to General Surgery   Left carotid bruit    I had ordered a carotid US in January; not done; aspirin plus statin, re-order the carotid US      Relevant Orders   US Carotid Bilateral   Family hx of colon cancer requiring screening colonoscopy    Refer to general surgery      Relevant Orders   Ambulatory referral to General Surgery   Colon cancer screening    Referred patient for colonoscopy in January, but did not happen; will re-refer; father had colon cancer      Relevant Orders   Ambulatory referral to General Surgery    Other Visit Diagnoses    Screen for colon cancer       Need for vaccination against Streptococcus pneumoniae using pneumococcal conjugate vaccine 13       Relevant Orders   Pneumococcal conjugate vaccine 13-valent (Completed)       Follow up plan: Return in about 2 months (around 08/23/2016) for fasting labs only; return to see Dr. Sanda Klein in 6 months.  An after-visit summary was printed and given to the patient at [place].  Please see the patient instructions which may contain other information and recommendations beyond what is mentioned above in the assessment and plan.  Meds ordered this encounter  Medications  . amoxicillin (AMOXIL) 875 MG tablet    Sig: Take 1 tablet (875 mg total) by mouth 2 (two) times daily.     Dispense:  14 tablet    Refill:  0  . losartan-hydrochlorothiazide (HYZAAR) 100-25 MG tablet    Sig: Take 1 tablet by mouth daily.    Dispense:  30 tablet    Refill:  6    Orders Placed This Encounter  Procedures  .  US Carotid Bilateral  . Pneumococcal conjugate vaccine 13-valent  . Ambulatory referral to General Surgery

## 2016-06-21 NOTE — Assessment & Plan Note (Signed)
Refer to general surgeon for evaluation; reasons to go to ER reviewed

## 2016-06-27 NOTE — Assessment & Plan Note (Signed)
I had ordered a carotid US in January; not done; aspirin plus statin, re-order the carotid US

## 2016-06-27 NOTE — Assessment & Plan Note (Addendum)
Not to goal; discussed options with patient; he will start back on his antihypertensive; DASH guidelines encouraged

## 2016-06-28 ENCOUNTER — Encounter: Payer: Self-pay | Admitting: Surgery

## 2016-09-08 ENCOUNTER — Telehealth: Payer: Self-pay | Admitting: Family Medicine

## 2016-09-08 NOTE — Telephone Encounter (Signed)
Please help arrange for patient to get the carotid ultrasound that was ordered in June Thank you

## 2016-09-20 NOTE — Telephone Encounter (Signed)
Left a detail messaged  

## 2016-12-07 ENCOUNTER — Telehealth: Payer: Self-pay | Admitting: Family Medicine

## 2016-12-07 NOTE — Telephone Encounter (Signed)
Please ask the patient to get the carotid scan done prior to our visit in December; I have ordered this test twice and really encourage him to have this done soon Thank you

## 2016-12-08 NOTE — Telephone Encounter (Signed)
Per the request of Dr. Enid Derry, I tried to contact this patient to see if you wanted [place] to schedule his Carotid US since she wanted him to have it done prior to his December office visit, but there was no answer. A message was left for him to give [place] a call back if he wanted [place] to schedule it or if he declines. He was also given the number to scheduling [phone]) if he wanted to schedule it himself.

## 2016-12-20 ENCOUNTER — Telehealth: Payer: Self-pay | Admitting: Family Medicine

## 2016-12-20 ENCOUNTER — Ambulatory Visit: Payer: BLUE CROSS/BLUE SHIELD | Admitting: Family Medicine

## 2016-12-20 NOTE — Telephone Encounter (Signed)
Copied from [place] [phone]. Topic: Quick Communication - Rx Refill/Question >> Dec 20, 2016  7:50 AM Yvette Rack wrote: Has the patient contacted their pharmacy? No.   (Agent: If no, request that the patient contact the pharmacy for the refill.) Refill on  losartan-hydrochlorothiazide (HYZAAR) 100-25 MG tablet  pravastatin (PRAVACHOL) 20 MG tablet  Preferred Pharmacy (with phone number or street name): [place], [place]. [phone] (Phone)    Agent: Please be advised that RX refills may take up to 3 business days. We ask that you follow-up with your pharmacy.

## 2016-12-20 NOTE — Telephone Encounter (Signed)
Medication refill for losartan-HCTZ and pravastatin

## 2016-12-21 MED ORDER — LOSARTAN POTASSIUM-HCTZ 100-25 MG PO TABS: 1.0000 | ORAL_TABLET | Freq: Every day | ORAL | 0 refills | Status: DC

## 2016-12-21 MED ORDER — PRAVASTATIN SODIUM 20 MG PO TABS: 20.0000 mg | ORAL_TABLET | Freq: Every day | ORAL | 0 refills | Status: DC

## 2016-12-21 NOTE — Telephone Encounter (Signed)
appt and labs needed; he has a visit later this month I'll allow one month of Rxs

## 2017-01-05 ENCOUNTER — Ambulatory Visit: Payer: BLUE CROSS/BLUE SHIELD | Admitting: Family Medicine

## 2017-01-11 ENCOUNTER — Ambulatory Visit: Payer: BLUE CROSS/BLUE SHIELD | Admitting: Family Medicine

## 2017-02-16 ENCOUNTER — Ambulatory Visit (INDEPENDENT_AMBULATORY_CARE_PROVIDER_SITE_OTHER): Payer: BLUE CROSS/BLUE SHIELD | Admitting: Family Medicine

## 2017-02-16 ENCOUNTER — Encounter: Payer: Self-pay | Admitting: Family Medicine

## 2017-02-16 VITALS — BP 150/84 | HR 98 | Temp 98.1°F | Ht 64.0 in | Wt 151.8 lb

## 2017-02-16 DIAGNOSIS — Z0001 Encounter for general adult medical examination with abnormal findings: Secondary | ICD-10-CM

## 2017-02-16 DIAGNOSIS — Z1211 Encounter for screening for malignant neoplasm of colon: Secondary | ICD-10-CM | POA: Diagnosis not present

## 2017-02-16 DIAGNOSIS — Z5181 Encounter for therapeutic drug level monitoring: Secondary | ICD-10-CM

## 2017-02-16 DIAGNOSIS — Z23 Encounter for immunization: Secondary | ICD-10-CM | POA: Diagnosis not present

## 2017-02-16 DIAGNOSIS — R739 Hyperglycemia, unspecified: Secondary | ICD-10-CM | POA: Diagnosis not present

## 2017-02-16 DIAGNOSIS — Z125 Encounter for screening for malignant neoplasm of prostate: Secondary | ICD-10-CM

## 2017-02-16 DIAGNOSIS — M79671 Pain in right foot: Secondary | ICD-10-CM | POA: Diagnosis not present

## 2017-02-16 DIAGNOSIS — Z1159 Encounter for screening for other viral diseases: Secondary | ICD-10-CM

## 2017-02-16 DIAGNOSIS — K429 Umbilical hernia without obstruction or gangrene: Secondary | ICD-10-CM

## 2017-02-16 DIAGNOSIS — Z800 Family history of malignant neoplasm of digestive organs: Secondary | ICD-10-CM

## 2017-02-16 DIAGNOSIS — H9190 Unspecified hearing loss, unspecified ear: Secondary | ICD-10-CM | POA: Insufficient documentation

## 2017-02-16 DIAGNOSIS — Z8042 Family history of malignant neoplasm of prostate: Secondary | ICD-10-CM

## 2017-02-16 DIAGNOSIS — H9193 Unspecified hearing loss, bilateral: Secondary | ICD-10-CM

## 2017-02-16 DIAGNOSIS — R7989 Other specified abnormal findings of blood chemistry: Secondary | ICD-10-CM | POA: Diagnosis not present

## 2017-02-16 DIAGNOSIS — I10 Essential (primary) hypertension: Secondary | ICD-10-CM

## 2017-02-16 DIAGNOSIS — E785 Hyperlipidemia, unspecified: Secondary | ICD-10-CM

## 2017-02-16 DIAGNOSIS — Z0000 Encounter for general adult medical examination without abnormal findings: Secondary | ICD-10-CM

## 2017-02-16 LAB — HEMOCCULT GUIAC POC 1CARD (OFFICE)
Card #1 Date: 2072019
Fecal Occult Blood, POC: NEGATIVE

## 2017-02-16 MED ORDER — LOSARTAN POTASSIUM-HCTZ 100-25 MG PO TABS: 1.0000 | ORAL_TABLET | Freq: Every day | ORAL | 3 refills | Status: DC

## 2017-02-16 MED ORDER — PRAVASTATIN SODIUM 20 MG PO TABS: 20.0000 mg | ORAL_TABLET | Freq: Every day | ORAL | 3 refills | Status: DC

## 2017-02-16 NOTE — Assessment & Plan Note (Signed)
Check A1c and non-fasting glucose 

## 2017-02-16 NOTE — Assessment & Plan Note (Signed)
Refer to surgeon. 

## 2017-02-16 NOTE — Assessment & Plan Note (Signed)
Check PSA prior to DRE

## 2017-02-16 NOTE — Assessment & Plan Note (Signed)
Recheck TSH 

## 2017-02-16 NOTE — Assessment & Plan Note (Signed)
USPSTF grade A and B recommendations reviewed with patient; age-appropriate recommendations, preventive care, screening tests, etc discussed and encouraged; healthy living encouraged; see AVS for patient education given to patient  

## 2017-02-16 NOTE — Assessment & Plan Note (Signed)
Refer to audiologist  

## 2017-02-16 NOTE — Progress Notes (Signed)
BP (!) 150/84 (BP Location: Left Arm, Patient Position: Sitting, Cuff Size: Normal)   Pulse 98   Temp 98.1 F (36.7 C) (Oral)   Ht 5\' 4"  (1.626 m)   Wt 151 lb 12.8 oz (68.9 kg)   SpO2 97%   BMI 26.06 kg/m    Subjective:    Patient ID: Xavier [name], male    DOB: 11/11/1950, 67 y.o.   MRN: [id number]  HPI: Xavier [name] is a 67 y.o. male  Chief Complaint  Patient presents with  . Annual Exam    HPI Patient is here for a complete physical He also needs some labs done and cholesterol checked He has a hx of elevated glucose; no diabetes though Trouble with hearing  USPSTF grade A and B recommendations Depression:  Depression screen [facility] 2/9 02/16/2017 06/21/2016 03/18/2016 02/23/2016 02/03/2016  Decreased Interest 0 0 0 0 0  Down, Depressed, Hopeless 0 0 0 0 0  PHQ - 2 Score 0 0 0 0 0   Hypertension: up today; eat some salty chips yesterday; has been out of his medicine 2 months BP Readings from Last 3 Encounters:  02/16/17 (!) 150/84  06/21/16 (!) 156/82  03/18/16 128/74   Obesity: Wt Readings from Last 3 Encounters:  02/16/17 151 lb 12.8 oz (68.9 kg)  06/21/16 149 lb 9.6 oz (67.9 kg)  03/18/16 149 lb 6.4 oz (67.8 kg)   BMI Readings from Last 3 Encounters:  02/16/17 26.06 kg/m  06/21/16 23.15 kg/m  03/18/16 23.12 kg/m    Immunizations: reviewed Skin cancer: just brown spots Lung cancer:  Never smoker Prostate cancer: his father had prostate cancer; he recalls episode at [facility], obstruction; they had a catheter but 3 nurses had to try and couldn't get it in at first, had to call chief urologist at [facility]; 2007; no stone prostate swelled up from infection Lab Results  Component Value Date   PSA 4.3 (H) 02/16/2017   PSA 2.7 02/03/2016   Colorectal cancer: order colonoscopy AAA: never smoker; brother died throwing up blood, 69 years older than patient; he was a drinker; had asathma real bad too Aspirin: takes aspirin, just forgets, no problems  Diet: good  eater sometimes, but slack soemtimes Exercise:  active Alcohol: well below the level Tobacco use: never HIV, hep B, hep C: hep C tested, not intersted in HIV STD testing and prevention (chl/gon/syphilis): no worries Glucose: drinks diet [employer]eps[employer] Glucose, Bld  Date Value Ref Range Status  02/16/2017 104 (H) 65 - 99 mg/dL Final    Comment:    .            Fasting reference interval . For someone without known diabetes, a glucose value between 100 and 125 mg/dL is consistent with prediabetes and should be confirmed with a follow-up test. .   02/23/2016 91 65 - 99 mg/dL Final  02/03/2016 134 (H) 65 - 99 mg/dL Final   Lipids: out of medicine for maybe 3+ months; tries to avoid fatty meats Lab Results  Component Value Date   CHOL 197 02/16/2017   CHOL 198 02/03/2016   CHOL 136 07/21/2014   Lab Results  Component Value Date   HDL 64 02/16/2017   HDL 62 02/03/2016   HDL 35 (L) 07/21/2014   Lab Results  Component Value Date   LDLCALC 116 (H) 02/03/2016   LDLCALC 81 07/21/2014   Lab Results  Component Value Date   TRIG 131 02/16/2017   TRIG 99 02/03/2016  TRIG 100 07/21/2014   Lab Results  Component Value Date   CHOLHDL 3.1 02/16/2017   CHOLHDL 3.2 02/03/2016   CHOLHDL 3.9 07/21/2014   No results found for: LDLDIRECT   Depression screen [facility] 2/9 02/16/2017 06/21/2016 03/18/2016 02/23/2016 02/03/2016  Decreased Interest 0 0 0 0 0  Down, Depressed, Hopeless 0 0 0 0 0  PHQ - 2 Score 0 0 0 0 0    Relevant past medical, surgical, family and social history reviewed Past Medical History:  Diagnosis Date  . Family hx of colon cancer requiring screening colonoscopy 06/21/2016   Father at age 87  . History of CVA (cerebrovascular accident) 06/22/2011   Overview:  right sided stroke in 2013 (basal ganglia, right insular region, and white matter of the left frontal lobe   . Hyperlipidemia   . Hypertension    Past Surgical History:  Procedure Laterality Date  . TONSILLECTOMY   1958   Family History  Problem Relation Age of Onset  . Arthritis Mother   . Kidney disease Mother   . Heart disease Father   . Cancer Father        lung  . Diabetes Brother   . Cancer Brother        stomach tumor  . Glaucoma Maternal Grandmother   . Kidney disease Paternal Grandmother   . Neuropathy Brother    Social History   Tobacco Use  . Smoking status: Never Smoker  . Smokeless tobacco: Never Used  Substance Use Topics  . Alcohol use: No    Alcohol/week: 0.0 oz  . Drug use: No   Interim medical history since last visit reviewed. Allergies and medications reviewed  Review of Systems  Respiratory: Negative for shortness of breath.   Cardiovascular: Negative for chest pain.  Gastrointestinal: Negative for blood in stool.  Genitourinary: Positive for difficulty urinating and frequency. Negative for dysuria, hematuria and scrotal swelling.   Per HPI unless specifically indicated above     Objective:    BP (!) 150/84 (BP Location: Left Arm, Patient Position: Sitting, Cuff Size: Normal)   Pulse 98   Temp 98.1 F (36.7 C) (Oral)   Ht 5\' 4"  (1.626 m)   Wt 151 lb 12.8 oz (68.9 kg)   SpO2 97%   BMI 26.06 kg/m   Wt Readings from Last 3 Encounters:  02/16/17 151 lb 12.8 oz (68.9 kg)  06/21/16 149 lb 9.6 oz (67.9 kg)  03/18/16 149 lb 6.4 oz (67.8 kg)    Physical Exam  Constitutional: He appears well-developed and well-nourished. No distress.  HENT:  Head: Normocephalic and atraumatic.  Nose: Nose normal.  Mouth/Throat: Oropharynx is clear and moist.  Eyes: EOM are normal. No scleral icterus.  Neck: No JVD present. No thyromegaly present.  Cardiovascular: Normal rate, regular rhythm and normal heart sounds.  Pulmonary/Chest: Effort normal and breath sounds normal. No respiratory distress. He has no wheezes. He has no rales.  Abdominal: Soft. Bowel sounds are normal. He exhibits no distension. There is no tenderness. There is no guarding. A hernia (umbilical;  soft, reducible) is present.  Genitourinary: Prostate is enlarged (mildly enlarged; symmetric; no nodules). Prostate is not tender.  Musculoskeletal: Normal range of motion. He exhibits no edema.  Lymphadenopathy:    He has no cervical adenopathy.  Neurological: He is alert. He displays normal reflexes. He exhibits normal muscle tone. Coordination normal.  Skin: Skin is warm and dry. No rash noted. He is not diaphoretic. No erythema. No pallor.  Psychiatric:  He has a normal mood and affect. His behavior is normal. Judgment and thought content normal.      Assessment & Plan:   Problem List Items Addressed This Visit      Cardiovascular and Mediastinum   Hypertension goal BP (blood pressure) < 140/90    Start back on BP medicine; avoid salty snacks, try to follow DASH guidelines      Relevant Medications   pravastatin (PRAVACHOL) 20 MG tablet   losartan-hydrochlorothiazide (HYZAAR) 100-25 MG tablet     Nervous and Auditory   Hearing loss    Refer to audiologist      Relevant Orders   Ambulatory referral to Audiology     Other   Umbilical hernia without obstruction and without gangrene    Refer to surgeon      Relevant Orders   Ambulatory referral to General Surgery   Prostate cancer screening    Check PSA prior to DRE      Preventative health care - Primary    USPSTF grade A and B recommendations reviewed with patient; age-appropriate recommendations, preventive care, screening tests, etc discussed and encouraged; healthy living encouraged; see AVS for patient education given to patient      Medication monitoring encounter    Check labs      Relevant Orders   CBC with Differential/Platelet (Completed)   COMPLETE METABOLIC PANEL WITH GFR (Completed)   Low serum thyroid stimulating hormone (TSH)    Recheck TSH      Relevant Orders   TSH (Completed)   Hyperlipidemia LDL goal <100    Check lipids today      Relevant Medications   pravastatin (PRAVACHOL) 20 MG  tablet   losartan-hydrochlorothiazide (HYZAAR) 100-25 MG tablet   Other Relevant Orders   Lipid panel (Completed)   Hyperglycemia    Check A1c and non-fasting glucose      Relevant Orders   Hemoglobin A1c (Completed)   Colon cancer screening    Colonoscopy referral entered       Other Visit Diagnoses    Flu vaccine need       Relevant Orders   Flu vaccine HIGH DOSE PF (Fluzone High dose) (Completed)   Need for hepatitis C screening test       Relevant Orders   Hepatitis C antibody   Family history of colon cancer in father       Relevant Orders   Ambulatory referral to Gastroenterology   Encounter for screening colonoscopy       Relevant Orders   Ambulatory referral to Gastroenterology   Family history of prostate cancer in father       Relevant Orders   PSA (Completed)   Foot pain, right       Relevant Orders   DG Foot Complete Right   Encounter for Hemoccult screening       Relevant Orders   POCT occult blood stool (Completed)       Follow up plan: Return in about 1 year (around 02/16/2018) for complete physical; 6 months for other health issues.  An after-visit summary was printed and given to the patient at [place].  Please see the patient instructions which may contain other information and recommendations beyond what is mentioned above in the assessment and plan.  Meds ordered this encounter  Medications  . pravastatin (PRAVACHOL) 20 MG tablet    Sig: Take 1 tablet (20 mg total) by mouth at bedtime.    Dispense:  90 tablet  Refill:  3  . losartan-hydrochlorothiazide (HYZAAR) 100-25 MG tablet    Sig: Take 1 tablet by mouth daily.    Dispense:  90 tablet    Refill:  3    Orders Placed This Encounter  Procedures  . DG Foot Complete Right  . Flu vaccine HIGH DOSE PF (Fluzone High dose)  . Hepatitis C antibody  . PSA  . CBC with Differential/Platelet  . COMPLETE METABOLIC PANEL WITH GFR  . Hemoglobin A1c  . Lipid panel  . TSH  . Hepatitis C  antibody  . Ambulatory referral to Gastroenterology  . Ambulatory referral to Audiology  . Ambulatory referral to General Surgery  . POCT occult blood stool

## 2017-02-16 NOTE — Patient Instructions (Addendum)
We'll get labs today I've put in referrals for you to see the general surgeon about your hernia, the audiologist about your hearing, and a gastroenterologist for colon cancer screening If you have not heard anything from my staff in a week about any orders/referrals/studies from today, please contact us here to follow-up ([phone] Start back on your medicines Return to see the medical assistant in two weeks to make sure your blood pressure is coming down to goal Try to follow the DASH guidelines (DASH stands for Dietary Approaches to Stop Hypertension). Try to limit the sodium in your diet to no more than 1,500mg  of sodium per day. Certainly try to not exceed 2,000 mg per day at the very most. Do not add salt when cooking or at the table.  Check the sodium amount on labels when shopping, and choose items lower in sodium when given a choice. Avoid or limit foods that already contain a lot of sodium. Eat a diet rich in fruits and vegetables and whole grains, and try to lose weight if overweight or obese  Health Maintenance, Male A healthy lifestyle and preventive care is important for your health and wellness. Ask your health care provider about what schedule of regular examinations is right for you. What should I know about weight and diet? Eat a Healthy Diet  Eat plenty of vegetables, fruits, whole grains, low-fat dairy products, and lean protein.  Do not eat a lot of foods high in solid fats, added sugars, or salt.  Maintain a Healthy Weight Regular exercise can help you achieve or maintain a healthy weight. You should:  Do at least 150 minutes of exercise each week. The exercise should increase your heart rate and make you sweat (moderate-intensity exercise).  Do strength-training exercises at least twice a week.  Watch Your Levels of Cholesterol and Blood Lipids  Have your blood tested for lipids and cholesterol every 5 years starting at 67 years of age. If you are at high risk for  heart disease, you should start having your blood tested when you are 66 years old. You may need to have your cholesterol levels checked more often if: ? Your lipid or cholesterol levels are high. ? You are older than 66 years of age. ? You are at high risk for heart disease.  What should I know about cancer screening? Many types of cancers can be detected early and may often be prevented. Lung Cancer  You should be screened every year for lung cancer if: ? You are a current smoker who has smoked for at least 30 years. ? You are a former smoker who has quit within the past 15 years.  Talk to your health care provider about your screening options, when you should start screening, and how often you should be screened.  Colorectal Cancer  Routine colorectal cancer screening usually begins at 66 years of age and should be repeated every 5-10 years until you are 66 years old. You may need to be screened more often if early forms of precancerous polyps or small growths are found. Your health care provider may recommend screening at an earlier age if you have risk factors for colon cancer.  Your health care provider may recommend using home test kits to check for hidden blood in the stool.  A small camera at the end of a tube can be used to examine your colon (sigmoidoscopy or colonoscopy). This checks for the earliest forms of colorectal cancer.  Prostate and Testicular Cancer  Depending on your age and overall health, your health care provider may do certain tests to screen for prostate and testicular cancer.  Talk to your health care provider about any symptoms or concerns you have about testicular or prostate cancer.  Skin Cancer  Check your skin from head to toe regularly.  Tell your health care provider about any new moles or changes in moles, especially if: ? There is a change in a mole's size, shape, or color. ? You have a mole that is larger than a pencil eraser.  Always use  sunscreen. Apply sunscreen liberally and repeat throughout the day.  Protect yourself by wearing long sleeves, pants, a wide-brimmed hat, and sunglasses when outside.  What should I know about heart disease, diabetes, and high blood pressure?  If you are 28-1 years of age, have your blood pressure checked every 3-5 years. If you are 47 years of age or older, have your blood pressure checked every year. You should have your blood pressure measured twice-once when you are at a hospital or clinic, and once when you are not at a hospital or clinic. Record the average of the two measurements. To check your blood pressure when you are not at a hospital or clinic, you can use: ? An automated blood pressure machine at a pharmacy. ? A home blood pressure monitor.  Talk to your health care provider about your target blood pressure.  If you are between 90-28 years old, ask your health care provider if you should take aspirin to prevent heart disease.  Have regular diabetes screenings by checking your fasting blood sugar level. ? If you are at a normal weight and have a low risk for diabetes, have this test once every three years after the age of 1. ? If you are overweight and have a high risk for diabetes, consider being tested at a younger age or more often.  A one-time screening for abdominal aortic aneurysm (AAA) by ultrasound is recommended for men aged 51-75 years who are current or former smokers. What should I know about preventing infection? Hepatitis B If you have a higher risk for hepatitis B, you should be screened for this virus. Talk with your health care provider to find out if you are at risk for hepatitis B infection. Hepatitis C Blood testing is recommended for:  Everyone born from [birth year] through 1964.  Anyone with known risk factors for hepatitis C.  Sexually Transmitted Diseases (STDs)  You should be screened each year for STDs including gonorrhea and chlamydia if: ? You are  sexually active and are younger than 66 years of age. ? You are older than 66 years of age and your health care provider tells you that you are at risk for this type of infection. ? Your sexual activity has changed since you were last screened and you are at an increased risk for chlamydia or gonorrhea. Ask your health care provider if you are at risk.  Talk with your health care provider about whether you are at high risk of being infected with HIV. Your health care provider may recommend a prescription medicine to help prevent HIV infection.  What else can I do?  Schedule regular health, dental, and eye exams.  Stay current with your vaccines (immunizations).  Do not use any tobacco products, such as cigarettes, chewing tobacco, and e-cigarettes. If you need help quitting, ask your health care provider.  Limit alcohol intake to no more than 2 drinks per day. One drink  equals 12 ounces of beer, 5 ounces of wine, or 1 ounces of hard liquor.  Do not use street drugs.  Do not share needles.  Ask your health care provider for help if you need support or information about quitting drugs.  Tell your health care provider if you often feel depressed.  Tell your health care provider if you have ever been abused or do not feel safe at home. This information is not intended to replace advice given to you by your health care provider. Make sure you discuss any questions you have with your health care provider. Document Released: 06/25/2007 Document Revised: 08/26/2015 Document Reviewed: 09/30/2014 Elsevier Interactive Patient Education  [employer].

## 2017-02-16 NOTE — Assessment & Plan Note (Signed)
Check labs 

## 2017-02-16 NOTE — Assessment & Plan Note (Signed)
Colonoscopy referral entered.

## 2017-02-16 NOTE — Assessment & Plan Note (Signed)
Check lipids today 

## 2017-02-17 ENCOUNTER — Other Ambulatory Visit: Payer: Self-pay

## 2017-02-17 DIAGNOSIS — D649 Anemia, unspecified: Secondary | ICD-10-CM

## 2017-02-17 DIAGNOSIS — R972 Elevated prostate specific antigen [PSA]: Secondary | ICD-10-CM

## 2017-02-17 LAB — CBC WITH DIFFERENTIAL/PLATELET
Basophils Absolute: 20 cells/uL (ref 0–200)
Basophils Relative: 0.3 %
EOS PCT: 2.1 %
Eosinophils Absolute: 143 cells/uL (ref 15–500)
HCT: 37.9 % — ABNORMAL LOW (ref 38.5–50.0)
Hemoglobin: 13 g/dL — ABNORMAL LOW (ref 13.2–17.1)
Lymphs Abs: 1367 cells/uL (ref 850–3900)
MCH: 31.2 pg (ref 27.0–33.0)
MCHC: 34.3 g/dL (ref 32.0–36.0)
MCV: 90.9 fL (ref 80.0–100.0)
MONOS PCT: 9.6 %
MPV: 9.6 fL (ref 7.5–12.5)
NEUTROS PCT: 67.9 %
Neutro Abs: 4617 cells/uL (ref 1500–7800)
PLATELETS: 282 10*3/uL (ref 140–400)
RBC: 4.17 10*6/uL — AB (ref 4.20–5.80)
RDW: 12.1 % (ref 11.0–15.0)
TOTAL LYMPHOCYTE: 20.1 %
WBC: 6.8 10*3/uL (ref 3.8–10.8)
WBCMIX: 653 {cells}/uL (ref 200–950)

## 2017-02-17 LAB — HEMOGLOBIN A1C
Hgb A1c MFr Bld: 5.3 % of total Hgb (ref <5.7)
Mean Plasma Glucose: 105 (calc)
eAG (mmol/L): 5.8 (calc)

## 2017-02-17 LAB — COMPLETE METABOLIC PANEL WITH GFR
AG RATIO: 1.6 (calc) (ref 1.0–2.5)
ALT: 15 U/L (ref 9–46)
AST: 18 U/L (ref 10–35)
Albumin: 4.2 g/dL (ref 3.6–5.1)
Alkaline phosphatase (APISO): 75 U/L (ref 40–115)
BUN: 11 mg/dL (ref 7–25)
CALCIUM: 9.3 mg/dL (ref 8.6–10.3)
CHLORIDE: 105 mmol/L (ref 98–110)
CO2: 26 mmol/L (ref 20–32)
Creat: 1.04 mg/dL (ref 0.70–1.25)
GFR, EST NON AFRICAN AMERICAN: 75 mL/min/{1.73_m2} (ref >=60)
GFR, Est African American: 87 mL/min/{1.73_m2} (ref >=60)
GLUCOSE: 104 mg/dL — AB (ref 65–99)
Globulin: 2.7 g/dL (calc) (ref 1.9–3.7)
POTASSIUM: 4.4 mmol/L (ref 3.5–5.3)
Sodium: 139 mmol/L (ref 135–146)
Total Bilirubin: 0.4 mg/dL (ref 0.2–1.2)
Total Protein: 6.9 g/dL (ref 6.1–8.1)

## 2017-02-17 LAB — LIPID PANEL
Cholesterol: 197 mg/dL (ref <200)
HDL: 64 mg/dL (ref >40)
LDL Cholesterol (Calc): 108 mg/dL (calc) — ABNORMAL HIGH
NON-HDL CHOLESTEROL (CALC): 133 mg/dL — AB (ref <130)
TRIGLYCERIDES: 131 mg/dL (ref <150)
Total CHOL/HDL Ratio: 3.1 (calc) (ref <5.0)

## 2017-02-17 LAB — HEPATITIS C ANTIBODY
HEP C AB: NONREACTIVE
SIGNAL TO CUT-OFF: 0.06 (ref <1.00)

## 2017-02-17 LAB — PSA: PSA: 4.3 ng/mL — AB (ref <=4.0)

## 2017-02-17 LAB — TSH: TSH: 0.97 mIU/L (ref 0.40–4.50)

## 2017-02-20 NOTE — Assessment & Plan Note (Signed)
Start back on BP medicine; avoid salty snacks, try to follow DASH guidelines

## 2017-02-23 ENCOUNTER — Encounter: Payer: Self-pay | Admitting: *Deleted

## 2017-02-24 ENCOUNTER — Ambulatory Visit (INDEPENDENT_AMBULATORY_CARE_PROVIDER_SITE_OTHER): Payer: BLUE CROSS/BLUE SHIELD | Admitting: Surgery

## 2017-02-24 ENCOUNTER — Encounter: Payer: Self-pay | Admitting: Surgery

## 2017-02-24 VITALS — BP 158/90 | HR 76 | Temp 97.7°F | Ht 64.0 in | Wt 152.2 lb

## 2017-02-24 DIAGNOSIS — K429 Umbilical hernia without obstruction or gangrene: Secondary | ICD-10-CM

## 2017-02-24 NOTE — Progress Notes (Signed)
Surgical Clinic History and Physical  Referring provider:  Arnetha Courser, MD [address] [place], [place] [address]  HISTORY OF PRESENT ILLNESS (HPI):  67 y.o. Xavier [name] presents for evaluation of an umbilical hernia. Patient reports he first noted his hernia at least 5-6 years ago, since which it has enlarged and occasionally causes him mild discomfort and moderate anxiety. He describes that his job involves frequent heavy lifting, and he denies constipation or frequent coughing or blood with coughing. However, patient does report frequent straining with urination and occasionally stops urinating mid-stream and awakens multiple times overnight to urinate. With regard to colonoscopy, patient says he is currently planning to call to schedule an appointment for his overdue first colonoscopy. Patient otherwise denies any history of his umbilical hernia not reducing spontaneously and denies any history of bloating or cessation of flatus.  PAST MEDICAL HISTORY (PMH):  Past Medical History:  Diagnosis Date  . Family hx of colon cancer requiring screening colonoscopy 06/21/2016   Father at age 25  . History of CVA (cerebrovascular accident) 06/22/2011   Overview:  right sided stroke in 2013 (basal ganglia, right insular region, and white matter of the left frontal lobe   . Hyperlipidemia   . Hypertension      PAST SURGICAL HISTORY ([place]):  Past Surgical History:  Procedure Laterality Date  . TONSILLECTOMY  1958     MEDICATIONS:  Prior to Admission medications   Medication Sig Start Date End Date Taking? Authorizing Provider  aspirin EC 81 MG tablet Take 1 tablet (81 mg total) by mouth daily. 02/03/16  Yes Lada, Satira Anis, MD  losartan-hydrochlorothiazide (HYZAAR) 100-25 MG tablet Take 1 tablet by mouth daily. 02/16/17  Yes Lada, Satira Anis, MD  pravastatin (PRAVACHOL) 20 MG tablet Take 1 tablet (20 mg total) by mouth at bedtime. 02/16/17  Yes Lada, Satira Anis, MD     ALLERGIES:  No  Known Allergies   SOCIAL HISTORY:  Social History   Socioeconomic History  . Marital status: Married    Spouse name: Not on file  . Number of children: Not on file  . Years of education: Not on file  . Highest education level: Not on file  Social Needs  . Financial resource strain: Not on file  . Food insecurity - worry: Not on file  . Food insecurity - inability: Not on file  . Transportation needs - medical: Not on file  . Transportation needs - non-medical: Not on file  Occupational History  . Not on file  Tobacco Use  . Smoking status: Never Smoker  . Smokeless tobacco: Never Used  Substance and Sexual Activity  . Alcohol use: No    Alcohol/week: 0.0 oz  . Drug use: No  . Sexual activity: Yes  Other Topics Concern  . Not on file  Social History Narrative  . Not on file    The patient currently resides (home / rehab facility / nursing home): Home The patient normally is (ambulatory / bedbound): Ambulatory  FAMILY HISTORY:  Family History  Problem Relation Age of Onset  . Arthritis Mother   . Kidney disease Mother   . Heart disease Father   . Cancer Father        lung, colon  . Diabetes Brother   . Cancer Brother        stomach tumor  . Glaucoma Maternal Grandmother   . Kidney disease Paternal Grandmother   . Neuropathy Brother     Otherwise negative/non-contributory.  REVIEW OF SYSTEMS:  Constitutional: denies any other weight loss, fever, chills, or sweats  Eyes: denies any other vision changes, history of eye injury  ENT: denies sore throat, hearing problems  Respiratory: denies shortness of breath, wheezing  Cardiovascular: denies chest pain, palpitations  Gastrointestinal: abdominal pain, N/V, and bowel function as per HPI Musculoskeletal: denies any other joint pains or cramps  Skin: Denies any other rashes or skin discolorations Urogenital: Urinary complaints as described per HPI Neurological: denies any other headache, dizziness, weakness   Psychiatric: Denies any other depression, anxiety   All other review of systems were otherwise negative   VITAL SIGNS:  @VSRANGES @     Height: 5\' 4"  (162.6 cm) Weight: 152 lb 3.2 oz (69 kg) BMI (Calculated): 26.11   PHYSICAL EXAM:  Constitutional:  -- Normal body habitus  -- Awake, alert, and oriented x3  Eyes:  -- Pupils equally round and reactive to light  -- No scleral icterus  Ear, nose, throat:  -- No jugular venous distension -- No nasal drainage, bleeding Pulmonary:  -- No crackles  -- Equal breath sounds bilaterally -- Breathing non-labored at rest Cardiovascular:  -- S1, S2 present  -- No pericardial rubs  Gastrointestinal:  -- Abdomen soft, nontender, non-distended, no guarding/rebound  -- Easily reducible non-tender moderate-sized umbilical hernia which appears to contain bowel -- No other abdominal masses appreciated, pulsatile or otherwise  Musculoskeletal and Integumentary:  -- Wounds or skin discoloration: None appreciated -- Extremities: B/L UE and LE FROM, hands and feet warm, no edema  Neurologic:  -- Motor function: Intact and symmetric -- Sensation: Intact and symmetric  Labs:  CBC Latest Ref Rng & Units 02/16/2017 02/03/2016 07/21/2014  WBC 3.8 - 10.8 Thousand/uL 6.8 5.7 11.3(H)  Hemoglobin 13.2 - 17.1 g/dL 13.0(L) 13.4 12.1(L)  Hematocrit 38.5 - 50.0 % 37.9(L) 41.0 36.2(L)  Platelets 140 - 400 Thousand/uL 282 270 470(H)   CMP Latest Ref Rng & Units 02/16/2017 02/23/2016 02/03/2016  Glucose 65 - 99 mg/dL 104(H) 91 134(H)  BUN 7 - 25 mg/dL 11 21 12   Creatinine 0.70 - 1.25 mg/dL 1.04 1.14 1.21  Sodium 135 - 146 mmol/L 139 138 141  Potassium 3.5 - 5.3 mmol/L 4.4 4.3 4.6  Chloride 98 - 110 mmol/L 105 103 106  CO2 20 - 32 mmol/L 26 26 29   Calcium 8.6 - 10.3 mg/dL 9.3 9.2 9.7  Total Protein 6.1 - 8.1 g/dL 6.9 - 6.8  Total Bilirubin 0.2 - 1.2 mg/dL 0.4 - 0.6  Alkaline Phos 40 - 115 U/L - - 68  AST 10 - 35 U/L 18 - 15  ALT 9 - 46 U/L 15 - 11    Imaging  studies: No new pertinent imaging studies available for review   Assessment/Plan:  67 y.o. Xavier [name] with moderate anxiety regarding his mildly symptomatic enlarged umbilical hernia x at least 5-6 years, complicated by urinary straining and nocturnal frequency concerning for BPH vs prostate cancer in the context of patient reporting a family history significant for paternal prostate cancer (though EMR documents only a history of colon cancer), no prior colonoscopy (overdue), and co-morbidities including HTN, HLD, and a history of stroke (2013).   - discussed with patient and his wife natural history of umbilical hernias  - signs and symptoms of incarceration and obstruction were also discussed and strategies for self-reduction were reviewed  - encourage follow-up with PMD and possible urology referral for urinary complaints and prostate evaluation  - risks, benefits, and alternatives to elective open  repair of reducible symptomatic umbilical hernia with mesh were discussed, and patient states he expects he'll want to have it repaired and will call when he decides and wishes to schedule surgery  - also encouraged to schedule overdue colonoscopy with GI referral offered; patient says he prefers to call to schedule  - return to clinic as needed, instructed to call if any questions or concerns  All of the above recommendations were discussed with the patient and patient's family, and all of patient's and family's questions were answered to their expressed satisfaction.  Thank you for the opportunity to participate in this patient's care.  -- Marilynne Drivers Rosana Hoes, MD, [place]: [facility] - Partnering for exceptional care. Office: [phone]

## 2017-02-24 NOTE — Patient Instructions (Signed)
We advise that you follow up with your Primary Care doctor regarding your prostate.  Please contact our office when you decide that you are ready to schedule your hernia repair.   Umbilical Hernia, Adult A hernia is a bulge of tissue that pushes through an opening between muscles. An umbilical hernia happens in the abdomen, near the belly button (umbilicus). The hernia may contain tissues from the small intestine, large intestine, or fatty tissue covering the intestines (omentum). Umbilical hernias in adults tend to get worse over time, and they require surgical treatment. There are several types of umbilical hernias. You may have:  A hernia located just above or below the umbilicus (indirect hernia). This is the most common type of umbilical hernia in adults.  A hernia that forms through an opening formed by the umbilicus (direct hernia).  A hernia that comes and goes (reducible hernia). A reducible hernia may be visible only when you strain, lift something heavy, or cough. This type of hernia can be pushed back into the abdomen (reduced).  A hernia that traps abdominal tissue inside the hernia (incarcerated hernia). This type of hernia cannot be reduced.  A hernia that cuts off blood flow to the tissues inside the hernia (strangulated hernia). The tissues can start to die if this happens. This type of hernia requires emergency treatment.  What are the causes? An umbilical hernia happens when tissue inside the abdomen presses on a weak area of the abdominal muscles. What increases the risk? You may have a greater risk of this condition if you:  Are obese.  Have had several pregnancies.  Have a buildup of fluid inside your abdomen (ascites).  Have had surgery that weakens the abdominal muscles.  What are the signs or symptoms? The main symptom of this condition is a painless bulge at or near the belly button. A reducible hernia may be visible only when you strain, lift something  heavy, or cough. Other symptoms may include:  Dull pain.  A feeling of pressure.  Symptoms of a strangulated hernia may include:  Pain that gets increasingly worse.  Nausea and vomiting.  Pain when pressing on the hernia.  Skin over the hernia becoming red or purple.  Constipation.  Blood in the stool.  How is this diagnosed? This condition may be diagnosed based on:  A physical exam. You may be asked to cough or strain while standing. These actions increase the pressure inside your abdomen and force the hernia through the opening in your muscles. Your health care provider may try to reduce the hernia by pressing on it.  Your symptoms and medical history.  How is this treated? Surgery is the only treatment for an umbilical hernia. Surgery for a strangulated hernia is done as soon as possible. If you have a small hernia that is not incarcerated, you may need to lose weight before having surgery. Follow these instructions at home:  Lose weight, if told by your health care provider.  Do not try to push the hernia back in.  Watch your hernia for any changes in color or size. Tell your health care provider if any changes occur.  You may need to avoid activities that increase pressure on your hernia.  Do not lift anything that is heavier than 10 lb (4.5 kg) until your health care provider says that this is safe.  Take over-the-counter and prescription medicines only as told by your health care provider.  Keep all follow-up visits as told by your health care  provider. This is important. Contact a health care provider if:  Your hernia gets larger.  Your hernia becomes painful. Get help right away if:  You develop sudden, severe pain near the area of your hernia.  You have pain as well as nausea or vomiting.  You have pain and the skin over your hernia changes color.  You develop a fever. This information is not intended to replace advice given to you by your health  care provider. Make sure you discuss any questions you have with your health care provider. Document Released: 05/29/2015 Document Revised: 08/30/2015 Document Reviewed: 05/29/2015 Elsevier Interactive Patient Education  [employer].

## 2017-02-26 ENCOUNTER — Encounter: Payer: Self-pay | Admitting: Surgery

## 2017-03-10 ENCOUNTER — Ambulatory Visit: Payer: BLUE CROSS/BLUE SHIELD | Admitting: Urology

## 2017-08-16 ENCOUNTER — Ambulatory Visit: Payer: BLUE CROSS/BLUE SHIELD | Admitting: Family Medicine

## 2017-09-19 ENCOUNTER — Ambulatory Visit
Admission: RE | Admit: 2017-09-19 | Discharge: 2017-09-19 | Disposition: A | Discharge status: Home / Self Care | Payer: BLUE CROSS/BLUE SHIELD | Source: Ambulatory Visit | Attending: Family Medicine | Admitting: Family Medicine

## 2017-09-19 ENCOUNTER — Ambulatory Visit (INDEPENDENT_AMBULATORY_CARE_PROVIDER_SITE_OTHER): Payer: BLUE CROSS/BLUE SHIELD | Admitting: Family Medicine

## 2017-09-19 ENCOUNTER — Encounter: Payer: Self-pay | Admitting: Family Medicine

## 2017-09-19 VITALS — BP 138/82 | HR 93 | Temp 98.4°F | Resp 14 | Ht 64.0 in | Wt 152.5 lb

## 2017-09-19 DIAGNOSIS — R972 Elevated prostate specific antigen [PSA]: Secondary | ICD-10-CM

## 2017-09-19 DIAGNOSIS — S6991XA Unspecified injury of right wrist, hand and finger(s), initial encounter: Secondary | ICD-10-CM

## 2017-09-19 DIAGNOSIS — I10 Essential (primary) hypertension: Secondary | ICD-10-CM

## 2017-09-19 DIAGNOSIS — R739 Hyperglycemia, unspecified: Secondary | ICD-10-CM

## 2017-09-19 DIAGNOSIS — M7542 Impingement syndrome of left shoulder: Secondary | ICD-10-CM

## 2017-09-19 DIAGNOSIS — Z5181 Encounter for therapeutic drug level monitoring: Secondary | ICD-10-CM

## 2017-09-19 DIAGNOSIS — H538 Other visual disturbances: Secondary | ICD-10-CM

## 2017-09-19 DIAGNOSIS — M19041 Primary osteoarthritis, right hand: Secondary | ICD-10-CM | POA: Diagnosis not present

## 2017-09-19 DIAGNOSIS — Z23 Encounter for immunization: Secondary | ICD-10-CM | POA: Diagnosis not present

## 2017-09-19 DIAGNOSIS — Z1211 Encounter for screening for malignant neoplasm of colon: Secondary | ICD-10-CM

## 2017-09-19 DIAGNOSIS — M19012 Primary osteoarthritis, left shoulder: Secondary | ICD-10-CM | POA: Diagnosis not present

## 2017-09-19 DIAGNOSIS — I83813 Varicose veins of bilateral lower extremities with pain: Secondary | ICD-10-CM

## 2017-09-19 DIAGNOSIS — D649 Anemia, unspecified: Secondary | ICD-10-CM

## 2017-09-19 DIAGNOSIS — R7989 Other specified abnormal findings of blood chemistry: Secondary | ICD-10-CM

## 2017-09-19 DIAGNOSIS — R0989 Other specified symptoms and signs involving the circulatory and respiratory systems: Secondary | ICD-10-CM

## 2017-09-19 DIAGNOSIS — E785 Hyperlipidemia, unspecified: Secondary | ICD-10-CM

## 2017-09-19 DIAGNOSIS — Z8673 Personal history of transient ischemic attack (TIA), and cerebral infarction without residual deficits: Secondary | ICD-10-CM

## 2017-09-19 DIAGNOSIS — R339 Retention of urine, unspecified: Secondary | ICD-10-CM

## 2017-09-19 NOTE — Assessment & Plan Note (Signed)
Controlled; continue antihypertensive; check -lytes and Cr today

## 2017-09-19 NOTE — Progress Notes (Signed)
BP 138/82   Pulse 93   Temp 98.4 F (36.9 C) (Oral)   Resp 14   Ht 5\' 4"  (1.626 m)   Wt 152 lb 8 oz (69.2 kg)   SpO2 98%   BMI 26.18 kg/m    Subjective:    Patient ID: Xavier [name], male    DOB: Jan 28, 1950, 67 y.o.   MRN: [id number]  HPI: Xavier [name] is a 67 y.o. male  Chief Complaint  Patient presents with  . Follow-up  . Shoulder Pain    left onset 4-5 weeks    HPI Patient is here for f/u; he dislocated his right pinky finger moving a mattress; happened 4-5 weeks ago; he is right-handed; wife made him a splint out of an old credit card; also put ice on it right after it happened; still swollen, but not as bad as it was at first; still can't completely flex the pinky; little bit of numbness of the end of the pinky finger  He has high blood pressure; he does not check BP away from the doctor; he gets a little light-headed at times; takes a deep breath at times; no chest pain; swelling in the lower legs; bad varicose veins; dad had the same thing; he has not seen a vascular doctor; father actually had vein surgery  He has high cholesterol; does eat some fatty meats; not many eggs; likes cheese Lab Results  Component Value Date   CHOL 197 02/16/2017   HDL 64 02/16/2017   [place] 108 (H) 02/16/2017   TRIG 131 02/16/2017   CHOLHDL 3.1 02/16/2017   He has been having left shoulder pain for a month; not the same event as the mattress; started to hurt when raising up the arm from the side; no weakness in the left arm; no injury; some arthritis in the knees and ankles  Colon cancer screening discussed; no blood in the stool  Prediabetes in the past; 3 years ago, the A1c was 5.8; back to normal the last two times; no one in the family with diabetes; blurred vision  Mild anemia; likes greens; no blood in the urine or stool  Trouble urinating; he'll go but then feels like he has to go again; PSA was mildly elevated last time it was checked, referral entered to  urologist but patient has not seen specialist; not fully emptying; has not seen urologist; father had prostate issues too, he had prostate or colon cancer, went to the lungs, maybe started in the prostate and spread he thinks; no blood in the urine; little urinary odor; no burning  Left carotid bruit; patient has not had the carotid US done; hx of stroke; on aspirin and statin  Depression screen [facility] 2/9 09/19/2017 02/16/2017 06/21/2016 03/18/2016 02/23/2016  Decreased Interest 0 0 0 0 0  Down, Depressed, Hopeless 1 0 0 0 0  PHQ - 2 Score 1 0 0 0 0  Altered sleeping 1 - - - -  Tired, decreased energy 0 - - - -  Change in appetite 0 - - - -  Feeling bad or failure about yourself  0 - - - -  Trouble concentrating 0 - - - -  Moving slowly or fidgety/restless 0 - - - -  Suicidal thoughts 0 - - - -  PHQ-9 Score 2 - - - -  Difficult doing work/chores Not difficult at all - - - -    Relevant past medical, surgical, family and social history reviewed Past Medical  History:  Diagnosis Date  . Family hx of colon cancer requiring screening colonoscopy 06/21/2016   Father at age 47  . History of CVA (cerebrovascular accident) 06/22/2011   Overview:  right sided stroke in 2013 (basal ganglia, right insular region, and white matter of the left frontal lobe   . Hyperlipidemia   . Hypertension    Past Surgical History:  Procedure Laterality Date  . TONSILLECTOMY  1958   Family History  Problem Relation Age of Onset  . Arthritis Mother   . Kidney disease Mother   . Heart disease Father   . Cancer Father        lung, colon  . Diabetes Brother   . Cancer Brother        stomach tumor  . Glaucoma Maternal Grandmother   . Kidney disease Paternal Grandmother   . Neuropathy Brother    Social History   Tobacco Use  . Smoking status: Never Smoker  . Smokeless tobacco: Never Used  Substance Use Topics  . Alcohol use: No    Alcohol/week: 0.0 standard drinks  . Drug use: No    Interim medical  history since last visit reviewed. Allergies and medications reviewed  Review of Systems Per HPI unless specifically indicated above     Objective:    BP 138/82   Pulse 93   Temp 98.4 F (36.9 C) (Oral)   Resp 14   Ht 5\' 4"  (1.626 m)   Wt 152 lb 8 oz (69.2 kg)   SpO2 98%   BMI 26.18 kg/m   Wt Readings from Last 3 Encounters:  09/19/17 152 lb 8 oz (69.2 kg)  02/24/17 152 lb 3.2 oz (69 kg)  02/16/17 151 lb 12.8 oz (68.9 kg)    Physical Exam  Constitutional: He appears well-developed and well-nourished. No distress.  HENT:  Head: Normocephalic and atraumatic.  Eyes: EOM are normal. No scleral icterus.  Neck: No thyromegaly present.  Cardiovascular: Normal rate and regular rhythm.  Pulmonary/Chest: Effort normal and breath sounds normal.  Abdominal: Soft. Bowel sounds are normal. He exhibits no distension.  Musculoskeletal: He exhibits no edema.       Left shoulder: He exhibits decreased range of motion and tenderness. He exhibits no deformity and normal strength.       Right hand: He exhibits decreased range of motion, tenderness and deformity.       Hands: Swelling of the RIGHT pinky finger with slight violaceous discoloration  Neurological: He is alert. Coordination normal.  Sensation diminished to light tough distal RIGHT pinky finger  Skin: Skin is warm and dry. No pallor.  Large ropey varicosities both legs; no ulcerations seen  Psychiatric: He has a normal mood and affect. His behavior is normal. Judgment and thought content normal. His mood appears not anxious. He does not exhibit a depressed mood.    Results for orders placed or performed in visit on 02/16/17  PSA  Result Value Ref Range   PSA 4.3 (H) < OR = 4.0 ng/mL  CBC with Differential/Platelet  Result Value Ref Range   WBC 6.8 3.8 - 10.8 Thousand/uL   RBC 4.17 (L) 4.20 - 5.80 Million/uL   Hemoglobin 13.0 (L) 13.2 - 17.1 g/dL   HCT 37.9 (L) 38.5 - 50.0 %   MCV 90.9 80.0 - 100.0 fL   MCH 31.2 27.0 -  33.0 pg   MCHC 34.3 32.0 - 36.0 g/dL   RDW 12.1 11.0 - 15.0 %   Platelets 282 140 -  400 Thousand/uL   MPV 9.6 7.5 - 12.5 fL   Neutro Abs 4,617 1,500 - 7,800 cells/uL   Lymphs Abs 1,367 850 - 3,900 cells/uL   WBC mixed population 653 200 - 950 cells/uL   Eosinophils Absolute 143 15 - 500 cells/uL   Basophils Absolute 20 0 - 200 cells/uL   Neutrophils Relative % 67.9 %   Total Lymphocyte 20.1 %   Monocytes Relative 9.6 %   Eosinophils Relative 2.1 %   Basophils Relative 0.3 %  COMPLETE METABOLIC PANEL WITH GFR  Result Value Ref Range   Glucose, Bld 104 (H) 65 - 99 mg/dL   BUN 11 7 - 25 mg/dL   Creat 1.04 0.70 - 1.25 mg/dL   GFR, Est Non African American 75 > OR = 60 mL/min/1.28m2   GFR, Est African American 87 > OR = 60 mL/min/1.9m2   BUN/Creatinine Ratio NOT APPLICABLE 6 - 22 (calc)   Sodium 139 135 - 146 mmol/L   Potassium 4.4 3.5 - 5.3 mmol/L   Chloride 105 98 - 110 mmol/L   CO2 26 20 - 32 mmol/L   Calcium 9.3 8.6 - 10.3 mg/dL   Total Protein 6.9 6.1 - 8.1 g/dL   Albumin 4.2 3.6 - 5.1 g/dL   Globulin 2.7 1.9 - 3.7 g/dL (calc)   AG Ratio 1.6 1.0 - 2.5 (calc)   Total Bilirubin 0.4 0.2 - 1.2 mg/dL   Alkaline phosphatase (APISO) 75 40 - 115 U/L   AST 18 10 - 35 U/L   ALT 15 9 - 46 U/L  Hemoglobin A1c  Result Value Ref Range   Hgb A1c MFr Bld 5.3 <5.7 % of total Hgb   Mean Plasma Glucose 105 (calc)   eAG (mmol/L) 5.8 (calc)  Lipid panel  Result Value Ref Range   Cholesterol 197 <200 mg/dL   HDL 64 >40 mg/dL   Triglycerides 131 <150 mg/dL   LDL Cholesterol (Calc) 108 (H) mg/dL (calc)   Total CHOL/HDL Ratio 3.1 <5.0 (calc)   Non-HDL Cholesterol (Calc) 133 (H) <130 mg/dL (calc)  TSH  Result Value Ref Range   TSH 0.97 0.40 - 4.50 mIU/L  Hepatitis C antibody  Result Value Ref Range   Hepatitis C Ab NON-REACTIVE NON-REACTI   SIGNAL TO CUT-OFF 0.06 <1.00  POCT occult blood stool  Result Value Ref Range   Fecal Occult Blood, POC Negative Negative   Card #1 Date [id number]     Card #2 Fecal Occult Blod, POC     Card #2 Date     Card #3 Fecal Occult Blood, POC     Card #3 Date        Assessment & Plan:   Problem List Items Addressed This Visit      Cardiovascular and Mediastinum   Hypertension goal BP (blood pressure) < 140/90    Controlled; continue antihypertensive; check -lytes and Cr today        Other   Medication monitoring encounter   Relevant Orders   CBC with Differential/Platelet   COMPLETE METABOLIC PANEL WITH GFR   Low serum thyroid stimulating hormone (TSH)    This has returned to normal      Left carotid bruit    Patient has not had carotid scan done; I am re-ordering this and staff is explaining how to get this scheduled      Relevant Orders   US Carotid Duplex Bilateral   Hyperlipidemia LDL goal <70    Check lipids today; limit saturated  fats; continue statin      Hyperglycemia    Last two A1cs have been normal; check glucose today      History of CVA (cerebrovascular accident)    Continue aspirin and statin; I am re-ordering the carotid US, patient to schedule       Other Visit Diagnoses    Finger injury, right, initial encounter    -  Primary   concern for ligamentous/pulley injury; refer to hand specialist; xray ordered   Relevant Orders   Ambulatory referral to Orthopedic Surgery   DG Finger Little Right   Impingement syndrome of left shoulder       refer to physical therapy, orthopaedist   Relevant Orders   Ambulatory referral to Physical Therapy   DG Shoulder Left   Elevated PSA       refer to urologist; recheck PSA today   Relevant Orders   Ambulatory referral to Urology   PSA   Screen for colon cancer       Cologuard ordered   Relevant Orders   Cologuard   Need for influenza vaccination       Relevant Orders   Flu vaccine HIGH DOSE PF (Fluzone High dose) (Completed)   Need for 23-polyvalent pneumococcal polysaccharide vaccine       given today; no further boosters of this per current ACIP  guidelines   Relevant Orders   Pneumococcal polysaccharide vaccine 23-valent greater than or equal to 2yo subcutaneous/IM (Completed)   Varicose veins of both lower extremities with pain       refer to vascular   Relevant Orders   Ambulatory referral to Vascular Surgery   Blurred vision, right eye       refer to optometrist, Dr. Ellin Mayhew; check glucose today   Relevant Orders   Ambulatory referral to Optometry   Urinary retention with incomplete bladder emptying       check PSA, urine, refer to urologist   Relevant Orders   Ambulatory referral to Urology   Urinalysis w microscopic + reflex cultur   Anemia, unspecified type       check CBC, ferritin, TIBC, iron   Relevant Orders   CBC with Differential/Platelet   Iron, TIBC and Ferritin Panel       Follow up plan: Return in about 3 months (around 12/19/2017) for follow-up visit with Dr. Sanda Klein.  An after-visit summary was printed and given to the patient at [facility].  Please see the patient instructions which may contain other information and recommendations beyond what is mentioned above in the assessment and plan.  No orders of the defined types were placed in this encounter.   Orders Placed This Encounter  Procedures  . DG Shoulder Left  . DG Finger Little Right  . US Carotid Duplex Bilateral  . Flu vaccine HIGH DOSE PF (Fluzone High dose)  . Pneumococcal polysaccharide vaccine 23-valent greater than or equal to 2yo subcutaneous/IM  . Cologuard  . Urinalysis w microscopic + reflex cultur  . PSA  . Lipid panel  . CBC with Differential/Platelet  . COMPLETE METABOLIC PANEL WITH GFR  . Iron, TIBC and Ferritin Panel  . Ambulatory referral to Orthopedic Surgery  . Ambulatory referral to Physical Therapy  . Ambulatory referral to Vascular Surgery  . Ambulatory referral to Optometry  . Ambulatory referral to Urology   Face-to-face time with patient was more than 40 minutes, >50% time spent counseling and coordination of  care

## 2017-09-19 NOTE — Assessment & Plan Note (Signed)
Continue aspirin and statin; I am re-ordering the carotid US, patient to schedule

## 2017-09-19 NOTE — Assessment & Plan Note (Signed)
Last two A1cs have been normal; check glucose today

## 2017-09-19 NOTE — Assessment & Plan Note (Signed)
This has returned to normal

## 2017-09-19 NOTE — Assessment & Plan Note (Signed)
Check lipids today; limit saturated fats; continue statin 

## 2017-09-19 NOTE — Assessment & Plan Note (Signed)
Patient has not had carotid scan done; I am re-ordering this and staff is explaining how to get this scheduled

## 2017-09-19 NOTE — Patient Instructions (Addendum)
Please go across the street for your xrays We'll have see the physical therapist and the orthopaedist Try to limit saturated fats in your diet (bologna, hot dogs, barbeque, cheeseburgers, hamburgers, steak, bacon, sausage, cheese, etc.) and get more fresh fruits, vegetables, and whole grains We'll have you see the following specialists: orthopaedist, urologist, vascular doctor, optometrist Let's get labs today If you have not heard anything from my staff in a week about any orders/referrals/studies from today, please contact us here to follow-up [phone]

## 2017-09-20 ENCOUNTER — Other Ambulatory Visit: Payer: Self-pay | Admitting: Family Medicine

## 2017-09-20 ENCOUNTER — Encounter: Payer: Self-pay | Admitting: Family Medicine

## 2017-09-20 DIAGNOSIS — D649 Anemia, unspecified: Secondary | ICD-10-CM

## 2017-09-20 DIAGNOSIS — S62639A Displaced fracture of distal phalanx of unspecified finger, initial encounter for closed fracture: Secondary | ICD-10-CM | POA: Insufficient documentation

## 2017-09-20 MED ORDER — PRAVASTATIN SODIUM 40 MG PO TABS: 40.0000 mg | ORAL_TABLET | Freq: Every day | ORAL | 1 refills | Status: DC

## 2017-09-20 MED ORDER — SULFAMETHOXAZOLE-TRIMETHOPRIM 800-160 MG PO TABS: 1.0000 | ORAL_TABLET | Freq: Two times a day (BID) | ORAL | 0 refills | Status: DC

## 2017-09-20 NOTE — Progress Notes (Signed)
Rx sent for ABX Message left on voicemail, woman's voice said "you know what to do", so I left a vague voicemail that said if you are a patient of Dr. Delight Ovens and you were recently seen, you need an antibiotic and pick up at [facility]

## 2017-09-20 NOTE — Progress Notes (Signed)
Change dose of statin Refer to GI for colonoscopy for anemia Start ABX, have pt see urologist

## 2017-09-21 LAB — LIPID PANEL
CHOL/HDL RATIO: 2.7 (calc) (ref <5.0)
CHOLESTEROL: 154 mg/dL (ref <200)
HDL: 58 mg/dL (ref >40)
LDL CHOLESTEROL (CALC): 78 mg/dL
Non-HDL Cholesterol (Calc): 96 mg/dL (calc) (ref <130)
Triglycerides: 97 mg/dL (ref <150)

## 2017-09-21 LAB — IRON,TIBC AND FERRITIN PANEL
%SAT: 17 % — AB (ref 20–48)
Ferritin: 105 ng/mL (ref 24–380)
Iron: 48 ug/dL — ABNORMAL LOW (ref 50–180)
TIBC: 286 mcg/dL (calc) (ref 250–425)

## 2017-09-21 LAB — COMPLETE METABOLIC PANEL WITH GFR
AG Ratio: 1.8 (calc) (ref 1.0–2.5)
ALBUMIN MSPROF: 4.3 g/dL (ref 3.6–5.1)
ALKALINE PHOSPHATASE (APISO): 67 U/L (ref 40–115)
ALT: 11 U/L (ref 9–46)
AST: 16 U/L (ref 10–35)
BILIRUBIN TOTAL: 0.3 mg/dL (ref 0.2–1.2)
BUN: 14 mg/dL (ref 7–25)
CHLORIDE: 103 mmol/L (ref 98–110)
CO2: 28 mmol/L (ref 20–32)
Calcium: 9.5 mg/dL (ref 8.6–10.3)
Creat: 1.11 mg/dL (ref 0.70–1.25)
GFR, Est African American: 80 mL/min/{1.73_m2} (ref >=60)
GFR, Est Non African American: 69 mL/min/{1.73_m2} (ref >=60)
GLUCOSE: 117 mg/dL (ref 65–139)
Globulin: 2.4 g/dL (calc) (ref 1.9–3.7)
POTASSIUM: 3.8 mmol/L (ref 3.5–5.3)
Sodium: 139 mmol/L (ref 135–146)
Total Protein: 6.7 g/dL (ref 6.1–8.1)

## 2017-09-21 LAB — CBC WITH DIFFERENTIAL/PLATELET
BASOS ABS: 17 {cells}/uL (ref 0–200)
Basophils Relative: 0.2 %
EOS ABS: 148 {cells}/uL (ref 15–500)
Eosinophils Relative: 1.7 %
HCT: 36.6 % — ABNORMAL LOW (ref 38.5–50.0)
Hemoglobin: 12.6 g/dL — ABNORMAL LOW (ref 13.2–17.1)
Lymphs Abs: 1610 cells/uL (ref 850–3900)
MCH: 32 pg (ref 27.0–33.0)
MCHC: 34.4 g/dL (ref 32.0–36.0)
MCV: 92.9 fL (ref 80.0–100.0)
MONOS PCT: 10.1 %
MPV: 10.3 fL (ref 7.5–12.5)
Neutro Abs: 6047 cells/uL (ref 1500–7800)
Neutrophils Relative %: 69.5 %
PLATELETS: 273 10*3/uL (ref 140–400)
RBC: 3.94 10*6/uL — ABNORMAL LOW (ref 4.20–5.80)
RDW: 12.1 % (ref 11.0–15.0)
TOTAL LYMPHOCYTE: 18.5 %
WBC mixed population: 879 cells/uL (ref 200–950)
WBC: 8.7 10*3/uL (ref 3.8–10.8)

## 2017-09-21 LAB — URINALYSIS W MICROSCOPIC + REFLEX CULTURE
BILIRUBIN URINE: NEGATIVE
GLUCOSE, UA: NEGATIVE
HGB URINE DIPSTICK: NEGATIVE
Ketones, ur: NEGATIVE
NITRITES URINE, INITIAL: NEGATIVE
PH: 6.5 (ref 5.0–8.0)
Protein, ur: NEGATIVE
Specific Gravity, Urine: 1.016 (ref 1.001–1.03)
Squamous Epithelial / LPF: NONE SEEN /HPF (ref <=5)

## 2017-09-21 LAB — URINE CULTURE
MICRO NUMBER:: 91087409
SPECIMEN QUALITY: ADEQUATE

## 2017-09-21 LAB — CULTURE INDICATED

## 2017-09-21 LAB — PSA: PSA: 3.3 ng/mL (ref <=4.0)

## 2017-09-28 ENCOUNTER — Telehealth: Payer: Self-pay | Admitting: Family Medicine

## 2017-09-28 ENCOUNTER — Other Ambulatory Visit: Payer: Self-pay | Admitting: Family Medicine

## 2017-09-28 DIAGNOSIS — M7542 Impingement syndrome of left shoulder: Secondary | ICD-10-CM

## 2017-09-28 NOTE — Progress Notes (Signed)
Referral to OT

## 2017-09-28 NOTE — Telephone Encounter (Signed)
This needed to be in his MEDICAL RECORD NUMBERRE: [name]  Received: 6 days ago  Message Contents  Debby Freiberg, MD        I have already placed a note in Epic under Referral tab   Previous Messages    ----- Message -----  From: Arnetha Courser, MD  Sent: 09/21/2017  4:39 PM EDT  To: Simeon Craft  Subject: RE: [place]                Can you please attach your note to a patient chart so I have two identifiers. This only has a name with no date of birth and I cannot link anything to a chart. Thank you!   ----- Message -----  From: Simeon Craft  Sent: 09/21/2017  2:37 PM EDT  To: Arnetha Courser, MD  Subject: [name]                  I am with [facility] and wondering if we could get a referral for OT instead of PT for shoulder therapy      Patient does not need to go all the way to [facility] Please REFER for OT locally for shoulder

## 2017-09-28 NOTE — Assessment & Plan Note (Signed)
Refer to OT 

## 2017-09-29 ENCOUNTER — Encounter: Payer: Self-pay | Admitting: *Deleted

## 2017-10-03 ENCOUNTER — Ambulatory Visit: Admission: RE | Admit: 2017-10-03 | Payer: BLUE CROSS/BLUE SHIELD | Source: Ambulatory Visit

## 2017-10-05 LAB — COLOGUARD: Cologuard: NEGATIVE

## 2017-11-13 ENCOUNTER — Ambulatory Visit: Payer: BLUE CROSS/BLUE SHIELD | Admitting: Urology

## 2017-11-14 ENCOUNTER — Encounter: Payer: Self-pay | Admitting: Urology

## 2017-12-19 ENCOUNTER — Ambulatory Visit: Payer: BLUE CROSS/BLUE SHIELD | Admitting: Family Medicine

## 2018-02-19 ENCOUNTER — Encounter: Payer: BLUE CROSS/BLUE SHIELD | Admitting: Family Medicine

## 2018-03-11 ENCOUNTER — Telehealth: Payer: Self-pay | Admitting: Family Medicine

## 2018-03-13 NOTE — Telephone Encounter (Signed)
Lab Results  Component Value Date   CREATININE 1.11 09/19/2017   Lab Results  Component Value Date   K 3.8 09/19/2017   Lab Results  Component Value Date   CHOL 154 09/19/2017   HDL 58 09/19/2017   LDLCALC 78 09/19/2017   TRIG 97 09/19/2017   CHOLHDL 2.7 09/19/2017   Patient has had two no shows in a row (Dec and Feb) Please contact him, schedule appointment I'll provide Rx and we look forward to seeing him soon

## 2018-03-13 NOTE — Telephone Encounter (Signed)
lvm asking pt to return call to schedule appt, prescriptions have been sent to the pharmacy

## 2018-07-06 ENCOUNTER — Other Ambulatory Visit: Payer: Self-pay | Admitting: Family Medicine

## 2018-07-30 ENCOUNTER — Encounter: Payer: Self-pay | Admitting: Family Medicine

## 2018-08-07 ENCOUNTER — Emergency Department
Admission: EM | Admit: 2018-08-07 | Discharge: 2018-08-07 | Disposition: A | Discharge status: Home / Self Care | Payer: Medicare Other | Attending: Emergency Medicine | Admitting: Emergency Medicine

## 2018-08-07 ENCOUNTER — Other Ambulatory Visit: Payer: Self-pay

## 2018-08-07 DIAGNOSIS — Z7982 Long term (current) use of aspirin: Secondary | ICD-10-CM | POA: Diagnosis not present

## 2018-08-07 DIAGNOSIS — U071 COVID-19: Secondary | ICD-10-CM | POA: Insufficient documentation

## 2018-08-07 DIAGNOSIS — Z8673 Personal history of transient ischemic attack (TIA), and cerebral infarction without residual deficits: Secondary | ICD-10-CM | POA: Insufficient documentation

## 2018-08-07 DIAGNOSIS — I10 Essential (primary) hypertension: Secondary | ICD-10-CM | POA: Insufficient documentation

## 2018-08-07 DIAGNOSIS — R05 Cough: Secondary | ICD-10-CM

## 2018-08-07 DIAGNOSIS — Z20828 Contact with and (suspected) exposure to other viral communicable diseases: Secondary | ICD-10-CM

## 2018-08-07 DIAGNOSIS — Z79899 Other long term (current) drug therapy: Secondary | ICD-10-CM | POA: Insufficient documentation

## 2018-08-07 DIAGNOSIS — R5383 Other fatigue: Secondary | ICD-10-CM | POA: Diagnosis present

## 2018-08-07 DIAGNOSIS — R059 Cough, unspecified: Secondary | ICD-10-CM

## 2018-08-07 DIAGNOSIS — Z20822 Contact with and (suspected) exposure to covid-19: Secondary | ICD-10-CM

## 2018-08-07 NOTE — ED Notes (Signed)
See triage note  Presents with about a 2-3 week hx of fatique ,decreased appetite and intermittent sore throat  Afebrile on arrival

## 2018-08-07 NOTE — ED Triage Notes (Signed)
Pt c/o cough, sore throat, "feel like Ive been running a fever" states his father N law tested positive for covid yesterday and they were all together at the [place] recently.

## 2018-08-07 NOTE — ED Provider Notes (Signed)
[facility] Emergency Department Provider Note  ____________________________________________   First MD Initiated Contact with Patient 08/07/18 1201     (approximate)  I have reviewed the triage vital signs and the nursing notes.   HISTORY  Chief Complaint URI   HPI Xavier [name] is a 68 y.o. male presents to the ED with complaint of 2 to 3 weeks of fatigue, decreased appetite, intermittent sore throat and subjective fever.  Patient states that prior to his symptoms he went on a family trip to [facility] and began getting sick there.  He states that his father-in-law has same symptoms and tested positive yesterday for COVID-19.  He denies any difficulty breathing or shortness of breath today.  Currently he denies any pain.       Past Medical History:  Diagnosis Date  . Family hx of colon cancer requiring screening colonoscopy 06/21/2016   Father at age 63  . History of CVA (cerebrovascular accident) 06/22/2011   Overview:  right sided stroke in 2013 (basal ganglia, right insular region, and white matter of the left frontal lobe   . Hyperlipidemia   . Hypertension     Patient Active Problem List   Diagnosis Date Noted  . Impingement syndrome of shoulder, left 09/28/2017  . Avulsion fracture of distal phalanx of finger 09/20/2017  . Hearing loss 02/16/2017  . Preventative health care 02/16/2017  . Umbilical hernia without obstruction and without gangrene 06/21/2016  . Family hx of colon cancer requiring screening colonoscopy 06/21/2016  . Medication monitoring encounter 02/23/2016  . Hyperglycemia 02/04/2016  . Prostate cancer screening 02/03/2016  . Colon cancer screening 02/03/2016  . Left carotid bruit 02/03/2016  . Arthritis of both hands 03/10/2015  . Spider varicose veins 03/10/2015  . Low serum thyroid stimulating hormone (TSH) 07/22/2014  . Hyperlipidemia LDL goal <70 07/21/2014  . Hypertension goal BP (blood pressure) < 140/90  07/21/2014  . Peripheral arterial disease ([place]) 07/21/2014  . History of CVA (cerebrovascular accident) 06/22/2011  . Cerebral thrombosis with cerebral infarction ([place]) 06/22/2011    Past Surgical History:  Procedure Laterality Date  . TONSILLECTOMY  1958    Prior to Admission medications   Medication Sig Start Date End Date Taking? Authorizing Provider  aspirin EC 81 MG tablet Take 1 tablet (81 mg total) by mouth daily. 02/03/16   Lada, Satira Anis, MD  losartan-hydrochlorothiazide (HYZAAR) 100-25 MG tablet TAKE 1 TABLET BY MOUTH ONCE DAILY. 07/06/18   Poulose, Bethel Born, NP  pravastatin (PRAVACHOL) 40 MG tablet TAKE 1 TABLET BY MOUTH AT BEDTIME 03/13/18   Lada, Satira Anis, MD    Allergies Patient has no known allergies.  Family History  Problem Relation Age of Onset  . Arthritis Mother   . Kidney disease Mother   . Heart disease Father   . Cancer Father        lung, colon  . Diabetes Brother   . Cancer Brother        stomach tumor  . Glaucoma Maternal Grandmother   . Kidney disease Paternal Grandmother   . Neuropathy Brother     Social History Social History   Tobacco Use  . Smoking status: Never Smoker  . Smokeless tobacco: Never Used  Substance Use Topics  . Alcohol use: No    Alcohol/week: 0.0 standard drinks  . Drug use: No    Review of Systems Constitutional: Positive fever/chills, positive for fatigue. Eyes: No visual changes. ENT: Positive sore throat. Cardiovascular: Denies chest pain.  Respiratory: Denies shortness of breath.  Positive cough. Gastrointestinal: No abdominal pain.  No nausea, no vomiting.  No diarrhea.  Musculoskeletal: Positive for muscle aches. Skin: Negative for rash. Neurological: Negative for headaches, focal weakness or numbness. ___________________________________________   PHYSICAL EXAM:  VITAL SIGNS: ED Triage Vitals  Enc Vitals Group     BP 08/07/18 1120 136/90     Pulse Rate 08/07/18 1120 77     Resp 08/07/18 1120 17      Temp 08/07/18 1120 98.7 F (37.1 C)     Temp Source 08/07/18 1120 Oral     SpO2 08/07/18 1120 98 %     Weight 08/07/18 1121 148 lb (67.1 kg)     Height 08/07/18 1121 5\' 4"  (1.626 m)     Head Circumference --      Peak Flow --      Pain Score 08/07/18 1121 0     Pain Loc --      Pain Edu? --      Excl. in [place]? --    Constitutional: Alert and oriented. Well appearing and in no acute distress. Eyes: Conjunctivae are normal.  Head: Atraumatic. Neck: No stridor.   Hematological/Lymphatic/Immunilogical: No cervical lymphadenopathy. Cardiovascular: Normal rate, regular rhythm. Grossly normal heart sounds.  Good peripheral circulation. Respiratory: Normal respiratory effort.  No retractions. Lungs CTAB. Gastrointestinal: Soft and nontender. No distention. Musculoskeletal: Moves upper and lower extremities with any difficulty.  Normal gait was noted. Neurologic:  Normal speech and language. No gross focal neurologic deficits are appreciated.  Skin:  Skin is warm, dry and intact. No rash noted. Psychiatric: Mood and affect are normal. Speech and behavior are normal.  ____________________________________________   LABS (all labs ordered are listed, but only abnormal results are displayed)  Labs Reviewed  NOVEL CORONAVIRUS, NAA (HOSPITAL ORDER, SEND-OUT TO REF LAB)    PROCEDURES  Procedure(s) performed (including Critical Care):  Procedures   ____________________________________________   INITIAL IMPRESSION / ASSESSMENT AND PLAN / ED COURSE  As part of my medical decision making, I reviewed the following data within the electronic MEDICAL RECORD NUMBER Notes from prior ED visits and [place] Controlled Substance Database  Xavier [name] was evaluated in Emergency Department on 08/07/2018 for the symptoms described in the history of present illness. He was evaluated in the context of the global COVID-19 pandemic, which necessitated consideration that the patient might be at risk for  infection with the SARS-CoV-2 virus that causes COVID-19. Institutional protocols and algorithms that pertain to the evaluation of patients at risk for COVID-19 are in a state of rapid change based on information released by regulatory bodies including the CDC and federal and state organizations. These policies and algorithms were followed during the patient's care in the ED.  68 year old male presents to the ED with complaint of generalized fatigue, decreased appetite, sore throat, subjective fever and chills.  He states that his father-in-law tested positive yesterday for COVID and they were all together 2 to 3 weeks ago at [facility].  Patient denies any difficulty breathing or shortness of breath at this time.  COVID test was ordered and patient was made aware that it would be 2 to 3 days before he receives results. ____________________________________________   FINAL CLINICAL IMPRESSION(S) / ED DIAGNOSES  Final diagnoses:  Cough  Close Exposure to Covid-19 Virus     ED Discharge Orders    None       Note:  This document was prepared using Dragon voice recognition software  and may include unintentional dictation errors.    Johnn Hai, PA-C 08/07/18 1422    Harvest Dark, MD 08/07/18 1459

## 2018-08-07 NOTE — Discharge Instructions (Addendum)
Follow-up with your primary care provider if any continued problems.  The COVID 19 test is being sent out and you should hear the results in the third day.  Isolate yourself until you have received the results of your test.  Read the information about COVID.  Return to the emergency department if any severe worsening of your symptoms or shortness of breath.

## 2018-08-09 ENCOUNTER — Telehealth: Payer: Self-pay | Admitting: Emergency Medicine

## 2018-08-09 LAB — NOVEL CORONAVIRUS, NAA (HOSP ORDER, SEND-OUT TO REF LAB; TAT 18-24 HRS): SARS-CoV-2, NAA: DETECTED — AB

## 2018-08-09 NOTE — Telephone Encounter (Addendum)
Called patient to assure he is aware of positive covid 19 result.  Left message.  Later spoke to him and informed him of positive result.  Explained cdc guidelines for coming out of isolation and for those exposed to him.  Also informedhim to return here if he gets sick--esepcially chest pain/ shortness of breath.

## 2018-08-30 ENCOUNTER — Encounter: Payer: Self-pay | Admitting: Family Medicine

## 2018-09-24 ENCOUNTER — Encounter: Payer: Self-pay | Admitting: Family Medicine

## 2018-10-25 ENCOUNTER — Other Ambulatory Visit: Payer: Self-pay

## 2018-10-25 NOTE — Patient Outreach (Signed)
[facility]) Care Management  10/25/2018  Hughie Martinz [birth year] [id number]   Medication Adherence call to Mr. [name] HIPPA Compliant Voice message left with a call back number. Mr. Roshto is showing past due on Pravastatin 40 mg and Losartan/Hctz 100/25 mg under [place].   [facility] Management Direct Dial [phone]  Fax [phone] [email or web address]

## 2018-10-29 ENCOUNTER — Telehealth: Payer: Self-pay

## 2018-10-29 NOTE — Telephone Encounter (Signed)
Pt had 2 cancellations and a no-show.  No medications will be refilled at this time. Needs appt or to find new PCP.

## 2018-11-12 ENCOUNTER — Other Ambulatory Visit: Payer: Self-pay

## 2018-11-12 NOTE — Patient Outreach (Signed)
[facility]) Care Management  11/12/2018  Xavier [name] 1950-06-27 [id number]   Medication Adherence call to Xavier [name] HIPPA Compliant Voice message left with a call back number.Xavier [name] is showing past due on Pravastatin 40 mg and Losartan/Hctz 100/25 mg under [place].   [place] Management Direct Dial [phone]  Fax [phone] [name]

## 2019-02-06 ENCOUNTER — Telehealth: Payer: Self-pay | Admitting: Family Medicine

## 2019-02-06 NOTE — Telephone Encounter (Signed)
Pt needs appoitment its been over a year since last seen

## 2019-02-06 NOTE — Telephone Encounter (Signed)
Medication Refill - Medication:  losartan-hydrochlorothiazide (HYZAAR) 100-25 MG tablet   pravastatin (PRAVACHOL) 40 MG tablet    Has the patient contacted their pharmacy? No. (Agent: If no, request that the patient contact the pharmacy for the refill.) (Agent: If yes, when and what did the pharmacy advise?)  Preferred Pharmacy (with phone number or street name):  [facility] [place], [place]. Phone:  [phone]  Fax:  [phone]       Agent: Please be advised that RX refills may take up to 3 business days. We ask that you follow-up with your pharmacy.

## 2019-02-06 NOTE — Telephone Encounter (Signed)
lvm for scheduling °

## 2019-02-07 ENCOUNTER — Telehealth: Payer: Self-pay | Admitting: Family Medicine

## 2019-02-07 NOTE — Telephone Encounter (Signed)
Last OV 09/19/17. Patient has been notified he would need OV in order to receive further refills. Request refused.

## 2019-02-15 ENCOUNTER — Other Ambulatory Visit: Payer: Self-pay | Admitting: Family Medicine

## 2019-02-15 MED ORDER — LOSARTAN POTASSIUM-HCTZ 100-25 MG PO TABS: 1.0000 | ORAL_TABLET | Freq: Every day | ORAL | 0 refills | Status: DC

## 2019-02-15 NOTE — Telephone Encounter (Signed)
Patient has appointment on 2/8

## 2019-02-15 NOTE — Telephone Encounter (Signed)
Pt is requesting a refill on his bp medication. Please send to [facility]. He is completely out

## 2019-02-18 ENCOUNTER — Other Ambulatory Visit: Payer: Self-pay

## 2019-02-18 ENCOUNTER — Ambulatory Visit (INDEPENDENT_AMBULATORY_CARE_PROVIDER_SITE_OTHER): Payer: Medicare HMO | Admitting: Family Medicine

## 2019-02-18 DIAGNOSIS — Z5329 Procedure and treatment not carried out because of patient's decision for other reasons: Secondary | ICD-10-CM

## 2019-02-18 DIAGNOSIS — Z91199 Patient's noncompliance with other medical treatment and regimen due to unspecified reason: Secondary | ICD-10-CM

## 2019-02-18 NOTE — Progress Notes (Signed)
No show

## 2019-02-21 NOTE — Progress Notes (Signed)
Name: Xavier [name]   MRN: [id number]    DOB: [birth year]   Date:02/22/2019       Progress Note  Subjective  Chief Complaint  Chief Complaint  Patient presents with  . Medication Refill    I connected with  Glyndon Raile  on 02/22/19 at 11:00 AM EST by a video enabled telemedicine application and verified that I am speaking with the correct person using two identifiers.  I discussed the limitations of evaluation and management by telemedicine and the availability of in person appointments. The patient expressed understanding and agreed to proceed. Staff also discussed with the patient that there may be a patient responsible charge related to this service. Patient Location: [place] Location: [facility] Additional Individuals present: wife Lost service at about 6 minutes and continued by phone and ok with patient  HPI  Patient is a 69 y.o male, presents for medication refills. Have not taken BP meds or lipid med for past 3 months. Ran out. His  last visit at [facility] was 09/2017; He missed follow-ups in 2020 and was noted to have Covid in July 2020. Took two weeks to get better.  In past, he was noted to have high blood pressure; he does not check BP away from the doctor; not checked in over a year. he gets a little light-headed intermittently at times mostly when trying to be more active, no passing out episodes, no bad headaches. Vision ok, sometimes gets blurry intermittently. Not seen eye doctor in long time. Rec'ed he f/u with one.    No chest pains, palp's, + mild SOB when tries to do too much. No fevers, occas cough, not marked, no production, no congestion, no PND, has poor circulation in legs, has swelling at times, has varicose veins, worse on the left. Has been more chronic.   He has h/o high cholesterol;  Recent Labs       Lab Results  Component Value Date   CHOL 197 02/16/2017   HDL 64 02/16/2017   LDLCALC 108 (H) 02/16/2017   TRIG 131 02/16/2017   CHOLHDL 3.1  02/16/2017     Noted to have some arthritis in the shoulder, knees and ankles in the past as well.   Prediabetes noted in the past; 4+ years ago, most recent A1c's ok, no FH of diabetes;  Lab Results  Component Value Date   HGBA1C 5.3 02/16/2017   HGBA1C 5.5 02/23/2016   HGBA1C 5.8 (H) 07/21/2014   Lab Results  Component Value Date   LDLCALC 78 09/19/2017   CREATININE 1.11 09/19/2017    Mild anemia in past noted;  no blood in the urine or stool in recent past, no black stools. Not have colonoscopy in his past. Did cologuard and was ok about 2 years ago reported.  Lab Results  Component Value Date   WBC 8.7 09/19/2017   HGB 12.6 (L) 09/19/2017   HCT 36.6 (L) 09/19/2017   MCV 92.9 09/19/2017   PLT 273 09/19/2017   Also noted some trouble urinating; sometimes can't hold it, he'll go but then feels like he has to go again; No dysuria. PSA was mildly elevated last time it was checked, referral entered to urologist but patient has not seen specialist since last visit. He stated he had seen one in the past prior. Noted father had prostate issues too, he had prostate or colon cancer and not sure which was primary as one spread to the other, went to the lungs, maybe started  in the prostate and spread he thinks;   Lab Results  Component Value Date   PSA1 4.0 07/21/2014   PSA 3.3 09/19/2017   PSA 4.3 (H) 02/16/2017   PSA 2.7 02/03/2016    Also noted to have a left carotid bruit; patient did not have the carotid US done that was rec'ed; + hx of stroke; on aspirin and statin  No smoking hx, exposures to a lot of chemicals and board dust with work for 30 years, worked at [employer].   Patient Active Problem List   Diagnosis Date Noted  . Impingement syndrome of shoulder, left 09/28/2017  . Avulsion fracture of distal phalanx of finger 09/20/2017  . Hearing loss 02/16/2017  . Preventative health care 02/16/2017  . Umbilical hernia without obstruction and without gangrene  06/21/2016  . Family hx of colon cancer requiring screening colonoscopy 06/21/2016  . Medication monitoring encounter 02/23/2016  . Hyperglycemia 02/04/2016  . Prostate cancer screening 02/03/2016  . Colon cancer screening 02/03/2016  . Left carotid bruit 02/03/2016  . Arthritis of both hands 03/10/2015  . Spider varicose veins 03/10/2015  . Low serum thyroid stimulating hormone (TSH) 07/22/2014  . Hyperlipidemia LDL goal <70 07/21/2014  . Hypertension goal BP (blood pressure) < 140/90 07/21/2014  . Peripheral arterial disease ([place]) 07/21/2014  . History of CVA (cerebrovascular accident) 06/22/2011  . Cerebral thrombosis with cerebral infarction ([place]) 06/22/2011    Past Surgical History:  Procedure Laterality Date  . TONSILLECTOMY  1958    Family History  Problem Relation Age of Onset  . Arthritis Mother   . Kidney disease Mother   . Heart disease Father   . Cancer Father        lung, colon  . Diabetes Brother   . Cancer Brother        stomach tumor  . Glaucoma Maternal Grandmother   . Kidney disease Paternal Grandmother   . Neuropathy Brother     Social History   Socioeconomic History  . Marital status: Married    Spouse name: Not on file  . Number of children: Not on file  . Years of education: Not on file  . Highest education level: Not on file  Occupational History  . Not on file  Tobacco Use  . Smoking status: Never Smoker  . Smokeless tobacco: Never Used  Substance and Sexual Activity  . Alcohol use: No    Alcohol/week: 0.0 standard drinks  . Drug use: No  . Sexual activity: Yes  Other Topics Concern  . Not on file  Social History Narrative  . Not on file   Social Determinants of Health   Financial Resource Strain:   . Difficulty of Paying Living Expenses: Not on file  Food Insecurity:   . Worried About Charity fundraiser in the Last Year: Not on file  . Ran Out of Food in the Last Year: Not on file  Transportation Needs:   . Lack of  Transportation (Medical): Not on file  . Lack of Transportation (Non-Medical): Not on file  Physical Activity:   . Days of Exercise per Week: Not on file  . Minutes of Exercise per Session: Not on file  Stress:   . Feeling of Stress : Not on file  Social Connections:   . Frequency of Communication with Friends and Family: Not on file  . Frequency of Social Gatherings with Friends and Family: Not on file  . Attends Religious Services: Not on file  . Active  Member of Clubs or Organizations: Not on file  . Attends Archivist Meetings: Not on file  . Marital Status: Not on file  Intimate Partner Violence:   . Fear of Current or Ex-Partner: Not on file  . Emotionally Abused: Not on file  . Physically Abused: Not on file  . Sexually Abused: Not on file     Current Outpatient Medications:  .  aspirin EC 81 MG tablet, Take 1 tablet (81 mg total) by mouth daily., Disp: , Rfl:  .  losartan-hydrochlorothiazide (HYZAAR) 100-25 MG tablet, TAKE 1 TABLET BY MOUTH ONCE DAILY, Disp: 7 tablet, Rfl: 0 .  pravastatin (PRAVACHOL) 40 MG tablet, TAKE 1 TABLET BY MOUTH AT BEDTIME, Disp: 90 tablet, Rfl: 0  No Known Allergies  With staff assistance, above reviewed with the patient today.   ROS: As per HPI, otherwise no specific complaints on a limited and focused system review   Objective  Virtual encounter, vitals not obtained.  Body mass index is 25.29 kg/m.  Physical Exam  Patient appears in NAD Pulmonary/Chest: Effort normal. No obvious respiratory distress. Speaking in complete sentences Neurological: Pt is alert and oriented,  Speech is normal.  Psychiatric: Patient has a normal mood and affect, behavior is normal. Very appropriate with conversation, judgment and thought content normal.   No results found for this or any previous visit (from the past 72 hour(s)).  PHQ2/9: Depression screen [facility] 2/9 02/22/2019 09/19/2017 02/16/2017 06/21/2016 03/18/2016  Decreased Interest 0 0 0 0 0    Down, Depressed, Hopeless 1 1 0 0 0  PHQ - 2 Score 1 1 0 0 0  Altered sleeping 1 1 - - -  Tired, decreased energy 0 0 - - -  Change in appetite 0 0 - - -  Feeling bad or failure about yourself  1 0 - - -  Trouble concentrating 0 0 - - -  Moving slowly or fidgety/restless 0 0 - - -  Suicidal thoughts 0 0 - - -  PHQ-9 Score 3 2 - - -  Difficult doing work/chores Not difficult at all Not difficult at all - - -   PHQ-2/9 Result reviewed - neg for major depression concerns  Fall Risk: Fall Risk  02/22/2019 09/19/2017 02/16/2017 06/21/2016 03/18/2016  Falls in the past year? 1 Yes Yes No No  Number falls in past yr: 0 1 1 - -  Injury with Fall? 1 No Yes - -  Comment hurt toe, knot in toe - - - -     Assessment & Plan 1. Hypertension goal BP (blood pressure) < 140/90 Has been off of medicines now for 3 months.  Has not had a follow-up blood pressure check over this time.  With the history noted, do feel returning to the blood pressure medicine is appropriate.  Once back on the medicine, will then have a follow-up in person for a blood pressure check, and also evaluation as you need some lab test done as well.  2. Hyperlipidemia LDL goal <70 Also will return to the statin product, as do feel important with his medical history to be on this.  His last LDL was not at goal.  We will obtain labs on follow-up to reassess, once back on the medicine.  3. Lower urinary tract symptoms (LUTS) He noted symptoms of concern with hesitancy, frequency, and actually some dribbling, and he noted he has a history of issues with his prostate.  He did not see the urologist that was  recommended with a referral given after the last visit.  His PSA was slightly up at that time as well. Do feel some limitations unable to examine today, and will plan to recheck a PSA when he follows up, is all as well as a prostate check to help assess further.  May again ask for urology involvement pending his status. Also t/c adding a  medicine like tamsulosin on f/u.  4. Dyspnea on exertion He noted today, his symptoms are more problematic when tries to do too much as he states.  Not having dyspnea at rest, no major infectious symptoms of concern recently, and no major increase in swelling to suggest more of a heart failure concern.  Again limitations not having him here to evaluate, and asked that we do so here in the near future.  Discussed an x-ray, and likely will pursue pending his status on follow-up.  He noted some concerns with potential occupational exposures in his past.  Also will get some laboratory tests to help.  5. Medication monitoring encounter Discussed concerns with stopping medicines, and the importance of being on these medicines now in the future when restarted.  6. History of CVA (cerebrovascular accident) We will continue the low-dose aspirin and that was recommended to him today.  Also the statin product is important to continue.  Also keeping his blood pressure well controlled is important.  7. Left carotid bruit This was noted in the past on an assessment, and he did not have the carotid ultrasound follow-up assessment that was recommended at that time.  Await in person follow-up here to reassess.  8. Spider varicose veins Noted history of this and this was not new.  May need referral over time if worsening   F/u in 3-4 weeks with in-person visit. Will check labs, including a PSA and likely a CXR pending status on f/u Emphaiszed f/u sooner if more problematic. He noted he would.  I discussed the assessment and treatment plan with the patient. The patient was provided an opportunity to ask questions and all were answered. The patient agreed with the plan and demonstrated an understanding of the instructions.  The patient was advised to call back or seek an in-person evaluation if the symptoms worsen.  I provided 25 minutes of non-face-to-face time during this encounter that included discussing at  length patient's sx/history, pertinent pmhx, medications, treatment and follow up plan. This time also included the necessary documentation, orders, and chart review.

## 2019-02-22 ENCOUNTER — Ambulatory Visit (INDEPENDENT_AMBULATORY_CARE_PROVIDER_SITE_OTHER): Payer: Medicare HMO | Admitting: Internal Medicine

## 2019-02-22 ENCOUNTER — Other Ambulatory Visit: Payer: Self-pay

## 2019-02-22 ENCOUNTER — Encounter: Payer: Self-pay | Admitting: Internal Medicine

## 2019-02-22 VITALS — Ht 65.0 in | Wt 152.0 lb

## 2019-02-22 DIAGNOSIS — Z8673 Personal history of transient ischemic attack (TIA), and cerebral infarction without residual deficits: Secondary | ICD-10-CM

## 2019-02-22 DIAGNOSIS — R0989 Other specified symptoms and signs involving the circulatory and respiratory systems: Secondary | ICD-10-CM

## 2019-02-22 DIAGNOSIS — R399 Unspecified symptoms and signs involving the genitourinary system: Secondary | ICD-10-CM | POA: Diagnosis not present

## 2019-02-22 DIAGNOSIS — R0600 Dyspnea, unspecified: Secondary | ICD-10-CM

## 2019-02-22 DIAGNOSIS — E785 Hyperlipidemia, unspecified: Secondary | ICD-10-CM | POA: Diagnosis not present

## 2019-02-22 DIAGNOSIS — I868 Varicose veins of other specified sites: Secondary | ICD-10-CM

## 2019-02-22 DIAGNOSIS — I10 Essential (primary) hypertension: Secondary | ICD-10-CM | POA: Diagnosis not present

## 2019-02-22 DIAGNOSIS — R0609 Other forms of dyspnea: Secondary | ICD-10-CM

## 2019-02-22 DIAGNOSIS — Z5181 Encounter for therapeutic drug level monitoring: Secondary | ICD-10-CM | POA: Diagnosis not present

## 2019-02-22 MED ORDER — PRAVASTATIN SODIUM 40 MG PO TABS: 40.0000 mg | ORAL_TABLET | Freq: Every day | ORAL | 3 refills | Status: DC

## 2019-02-22 MED ORDER — LOSARTAN POTASSIUM-HCTZ 100-25 MG PO TABS: 1.0000 | ORAL_TABLET | Freq: Every day | ORAL | 1 refills | Status: DC

## 2019-02-22 MED ORDER — LOSARTAN POTASSIUM-HCTZ 100-25 MG PO TABS: 1.0000 | ORAL_TABLET | Freq: Every day | ORAL | 0 refills | Status: DC

## 2019-02-22 MED ORDER — PRAVASTATIN SODIUM 40 MG PO TABS: 40.0000 mg | ORAL_TABLET | Freq: Every day | ORAL | 0 refills | Status: DC

## 2019-03-28 ENCOUNTER — Ambulatory Visit: Payer: Medicare HMO | Admitting: Internal Medicine

## 2019-03-28 NOTE — Progress Notes (Signed)
Patient ID: Xavier [name], male    DOB: 1950/09/17, 69 y.o.   MRN: [id number]  PCP: Xavier Hartshorn, FNP  Chief Complaint  Patient presents with  . Hypertension  . Hyperlipidemia    Subjective:   Xavier [name] is a 69 y.o. male, presents to clinic with CC of the following:  Chief Complaint  Patient presents with  . Hypertension  . Hyperlipidemia    HPI:  Patient is a 69 year old male who I last communicated via a telemedicine visit on 02/22/2019: At that time he noted he had not taken BP meds or lipid med for past 3 months. Ran out. His last visit at [facility] was 09/2017; He missed follow-ups in 2020 and was noted to have Covid in July 2020. Took two weeks to get better. He follows up today as recommended to return for a follow-up blood pressure check as well as laboratory studies. He noted he has returned to taking the medicines as prescribed.  HTN - In past,he was noted to have high blood pressure; he does not check BP away from the doctor;not checked in over a year noted on video visit.  he gets a little light-headedintermittently at times mostly when trying to be more active, no passing out episodes, no bad headaches.  Vision ok, sometimes gets blurry intermittently. Not seen eye doctor in long time. Rec'ed he f/u with one, has not as of yet.  BP Readings from Last 3 Encounters:  03/29/19 (!) 158/98  08/07/18 113/76  09/19/17 138/82   No chest pains, palp's, + mild SOB when tries to do too much, occas cough, not marked, no production, no congestion, no orthopnea, no PND, has poor circulation in legs and has swelling at times, has varicose veins, worse on the left. Has been more chronic.   Hyperlipidemia - He hash/ohigh cholesterol;  Taking the statin again. No myalgias Recent Labs       Lab Results  Component Value Date   CHOL 197 02/16/2017   HDL 64 02/16/2017   LDLCALC 108 (H) 02/16/2017   TRIG 131 02/16/2017   CHOLHDL 3.1 02/16/2017        Prediabetesnotedin the past; 4+years ago, most recentA1c's ok,noFH ofdiabetes; No increased thirst, some frequency and noted hesitancy, urgency, no hematuria.     Recent Labs        Lab Results  Component Value Date   HGBA1C 5.3 02/16/2017   HGBA1C 5.5 02/23/2016   HGBA1C 5.8 (H) 07/21/2014        Recent Labs        Lab Results  Component Value Date   LDLCALC 78 09/19/2017   CREATININE 1.11 09/19/2017      Mild anemiain past noted; no blood in the urine or stool in recent past, no black stools. Not have colonoscopy in his past.Did cologuard and was ok about 2 years ago reported. Discussed colonoscopy and with FH and if anemic, should seriously consider.      Recent Labs        Lab Results  Component Value Date   WBC 8.7 09/19/2017   HGB 12.6 (L) 09/19/2017   HCT 36.6 (L) 09/19/2017   MCV 92.9 09/19/2017   PLT 273 09/19/2017      LUTS - Also noted some trouble urinating;sometimes can't hold it,he'll go but then feels like he has to go again;No dysuria. PSA was mildly elevated in the past, He stated he had seen urologist in the past prior.  At  one point he went to an emergency room with prostatitis.  He noted that was years ago. FH - Notedfather had prostate issues too, he had prostate or colon cancerand not sure which was primary as one spread to the other, went to the lungs, maybe started in the prostate and spread he thinks noted prior, today noted thinks started in colon. Died in his 64's, diagnosed late 40's.     Recent Labs[] ?Expand by Default        Lab Results  Component Value Date   PSA1 4.0 07/21/2014   PSA 3.3 09/19/2017   PSA 4.3 (H) 02/16/2017   PSA 2.7 02/03/2016     Noted to havesome arthritis in theshoulder,knees and anklesin the pastas well. Noted has an umbilical hernia, and at times she struggles to push it back in, and sometimes is painful.  He lays flat and tries to relax,  and then has excess pushing it back in when he struggles. Also noted to have a left carotid bruit; patientdid not havethe carotid US done that was rec'ed;+hx of stroke 2013; on aspirin and statin.  Notes he has some subtle left-sided weakness after the stroke, and the upper extremity and not limiting.  Tob - No smoking hx, exposures to a lot of chemicals and board dust with work for 30 years, worked at [employer]. Alcohol - a beer/day, not more Diet - "needs to clean up a bit", lots of salt, potato chips, fries liked, east out 2-3X/week Exercise  - exercises daily, push-ups, no aerobic exercise     Patient Active Problem List   Diagnosis Date Noted  . Lower urinary tract symptoms (LUTS) 02/22/2019  . Impingement syndrome of shoulder, left 09/28/2017  . Avulsion fracture of distal phalanx of finger 09/20/2017  . Hearing loss 02/16/2017  . Preventative health care 02/16/2017  . Umbilical hernia without obstruction and without gangrene 06/21/2016  . Family hx of colon cancer requiring screening colonoscopy 06/21/2016  . Medication monitoring encounter 02/23/2016  . Hyperglycemia 02/04/2016  . Prostate cancer screening 02/03/2016  . Colon cancer screening 02/03/2016  . Left carotid bruit 02/03/2016  . Arthritis of both hands 03/10/2015  . Spider varicose veins 03/10/2015  . Low serum thyroid stimulating hormone (TSH) 07/22/2014  . Hyperlipidemia LDL goal <70 07/21/2014  . Hypertension goal BP (blood pressure) < 140/90 07/21/2014  . Peripheral arterial disease ([place]) 07/21/2014  . History of CVA (cerebrovascular accident) 06/22/2011  . Cerebral thrombosis with cerebral infarction ([place]) 06/22/2011      Current Outpatient Medications:  .  aspirin EC 81 MG tablet, Take 1 tablet (81 mg total) by mouth daily., Disp: , Rfl:  .  losartan-hydrochlorothiazide (HYZAAR) 100-25 MG tablet, Take 1 tablet by mouth daily., Disp: 90 tablet, Rfl: 1 .  pravastatin (PRAVACHOL) 40 MG tablet,  Take 1 tablet (40 mg total) by mouth at bedtime., Disp: 90 tablet, Rfl: 3   No Known Allergies   Past Surgical History:  Procedure Laterality Date  . TONSILLECTOMY  1958     Family History  Problem Relation Age of Onset  . Arthritis Mother   . Kidney disease Mother   . Heart disease Father   . Cancer Father        lung, colon  . Diabetes Brother   . Cancer Brother        stomach tumor  . Glaucoma Maternal Grandmother   . Kidney disease Paternal Grandmother   . Neuropathy Brother      Social History  Tobacco Use  . Smoking status: Never Smoker  . Smokeless tobacco: Never Used  Substance Use Topics  . Alcohol use: No    Alcohol/week: 0.0 standard drinks    With staff assistance, above reviewed with the patient today.  ROS: As per HPI, otherwise no specific complaints on a limited and focused system review   No results found for this or any previous visit (from the past 72 hour(s)).   PHQ2/9: Depression screen [facility] 2/9 03/29/2019 02/22/2019 09/19/2017 02/16/2017 06/21/2016  Decreased Interest 0 0 0 0 0  Down, Depressed, Hopeless 1 1 1  0 0  PHQ - 2 Score 1 1 1  0 0  Altered sleeping 1 1 1  - -  Tired, decreased energy 0 0 0 - -  Change in appetite 0 0 0 - -  Feeling bad or failure about yourself  0 1 0 - -  Trouble concentrating 0 0 0 - -  Moving slowly or fidgety/restless 0 0 0 - -  Suicidal thoughts 0 0 0 - -  PHQ-9 Score 2 3 2  - -  Difficult doing work/chores Not difficult at all Not difficult at all Not difficult at all - -   PHQ-2/9 Result is neg  Fall Risk: Fall Risk  03/29/2019 02/22/2019 09/19/2017 02/16/2017 06/21/2016  Falls in the past year? 1 1 Yes Yes No  Number falls in past yr: 0 0 1 1 -  Injury with Fall? 0 1 No Yes -  Comment - hurt toe, knot in toe - - -      Objective:   Vitals:   03/29/19 0841  BP: (!) 158/98  Pulse: 85  Resp: 16  Temp: (!) 97.3 F (36.3 C)  TempSrc: Temporal  SpO2: 100%  Weight: 158 lb (71.7 kg)  Height: 5\' 5"   (1.651 m)    Body mass index is 26.29 kg/m.  Physical Exam   NAD, masked, pleasant HEENT - /AT, sclera anicteric, PERRL, EOMI, conj - non-inj'ed, TM's and canals clear, pharynx clear, positive beard Neck - supple, no adenopathy, no TM, carotids 2+ with a very subtle left carotid bruit noted, Car - RRR without m/g/r Pulm- RR and effort normal at rest, CTA without wheeze or rales Abd - soft, NT, ND, BS+,  fairly large umbilical hernia, that protrudes with trying to do a partial sit up, easily reduces back today, he notes does get some pain and soreness around this intermittently.No HSM Back - no CVA tenderness Skin- no rash noted on exposed areas,  GU - Prostate - no tenderness, fluctuance, nodules on palpation, asymmetric, with mildly increased in size in right lobe greater than left No external hemmorhoids Ext - 1-2+ LE edema bilateral with impression at sock line noted when socks pulled down, positive varicosities scattered in the calf regions bilaterally, no marked tenderness palpating today, no erythema or increased warmth of the calf muscles, no cords identified no active joints Neuro/psychiatric - affect was not flat, appropriate with conversation  Alert and oriented  good strength on testing extremities, with a subtle deficit on the left versus the right and strength testing the upper extremity and with shoulder shrug, no difference noted in the testing the lower extremities with strength.  Sensation intact to LT in distal extremities, Romberg negative, no pronator drift, a very subtle resting tremor noted of the right upper extremity, not marked, no intention tremor, good finger-to-nose, gait was normal and could do tandem walk  Speech  normal   Results for orders placed or  performed during the hospital encounter of 08/07/18  Novel Coronavirus,NAA,(SEND-OUT TO REF LAB - TAT 24-48 hrs); Hosp Order   Specimen: Nasopharyngeal Swab; Respiratory  Result Value Ref Range   SARS-CoV-2,  NAA DETECTED (A) NOT DETECTED   Coronavirus Source NASOPHARYNGEAL        Assessment & Plan:  Patient noted had not been to the physician in a while, and importance of compliance discussed at length today, including with follow-ups.  1. Hypertension goal BP (blood pressure) < 140/90 Blood pressure not well controlled, has been back on the losartan hydrochlorothiazide combination, and do feel his blood pressure today reflects being on that medicine.  Have no outside readings to assess his blood pressure at home, and he notes he has no way to check it at home. We will check some labs today as it has been a while. We will add Norvasc-5 mg daily presently and take once a day in the morning Emphasized dietary modifications, including avoiding salt and a DASH diet discussed and information included in the AVS Also trying to include aerobic exercise as part of his exercise regimen is important - COMPLETE METABOLIC PANEL WITH GFR - amLODipine (NORVASC) 5 MG tablet; Take 1 tablet (5 mg total) by mouth daily.  Dispense: 90 tablet; Refill: 3 - losartan-hydrochlorothiazide (HYZAAR) 100-25 MG tablet; Take 1 tablet by mouth daily.  Dispense: 90 tablet; Refill: 3  2. Hyperlipidemia LDL goal <70 Continue the statin that he restarted recently, and will check a lipid panel today. Also avoiding potato chips and foods that make his neck and greasy emphasized, namely avoiding saturated fats, and increasing physical activity will also be helpful with his lipids. - COMPLETE METABOLIC PANEL WITH GFR - TSH - Lipid panel  3. Prediabetes We will check his A1c again today. Diet and exercise recommendations as noted above will be helpful with management here as well. - COMPLETE METABOLIC PANEL WITH GFR - TSH - Hemoglobin A1c  4. History of anemia Was mildly anemic on last check a while ago, with his Red cell indices noted.  Denies any black stools or change in bowel habits, and await a recheck today.  We will add  an iron panel as well.  If there are concerns, discussed the potential colonoscopy, and noted his family history which is still unclear if it was a primary colon cancer in his father.  If so, it was later in life.  He did have a negative Cologuard a couple years back reported.  Currently await repeat labs - CBC with Differential/Platelet - Iron, TIBC and Ferritin Panel  5. Peripheral arterial disease ([place]) As below  6. Spider varicose veins Does have swelling in his lower extremities, probably from the varicosities.  Discussed if the swelling is more problematic or the pain is increasing, referring to vascular, as there are some other modalities and recommendations to help further manage this.  7. History of CVA (cerebrovascular accident) There is a question of some subtle left upper extremity weakness after the prior stroke, although no major deficit.  Because of the left bruit, even though subtle, do feel checking a carotid ultrasound is needed and that was ordered. Continue the aspirin product presently - VAS US CAROTID; Future  8. Lower urinary tract symptoms (LUTS) We will check a urinalysis and PSA presently, and discussed potential medicines to add for symptoms he is having with urgency and hesitancy.  He has seen urology in the past as well.  If his PSA is elevated, will have them  involved again.  Await lab test presently and can discuss on follow-up potential medicines like tamsulosin that may be helpful. - PSA - Urinalysis, Complete  9. Left carotid bruit We will check a carotid ultrasound - VAS US CAROTID; Future  10. Umbilical hernia without obstruction and without gangrene Do feel a referral to general surgery is indicated given some difficulty getting it back in at times and some discomfort related to that.  Emphasized if he ever cannot get it back in and reduced, that is emergent and needs to be seen immediately. Await further input from general surgery. - Ambulatory  referral to General Surgery  11. FH of CA - ? primary As above, unclear of the primary, whether it is prostate or colon.  If it is colon, it seems to be later in life, in his late 57s when dad was diagnosed.  Continue with plans as above with respect to a colonoscopy in the near future.    Follow-up in 6 weeks time, sooner as needed.   Towanda Malkin, MD 03/29/19 8:55 AM

## 2019-03-29 ENCOUNTER — Encounter: Payer: Self-pay | Admitting: Internal Medicine

## 2019-03-29 ENCOUNTER — Other Ambulatory Visit: Payer: Self-pay

## 2019-03-29 ENCOUNTER — Ambulatory Visit (INDEPENDENT_AMBULATORY_CARE_PROVIDER_SITE_OTHER): Payer: Medicare HMO | Admitting: Internal Medicine

## 2019-03-29 VITALS — BP 158/98 | HR 85 | Temp 97.3°F | Resp 16 | Ht 65.0 in | Wt 158.0 lb

## 2019-03-29 DIAGNOSIS — R399 Unspecified symptoms and signs involving the genitourinary system: Secondary | ICD-10-CM

## 2019-03-29 DIAGNOSIS — K429 Umbilical hernia without obstruction or gangrene: Secondary | ICD-10-CM | POA: Diagnosis not present

## 2019-03-29 DIAGNOSIS — R7303 Prediabetes: Secondary | ICD-10-CM | POA: Diagnosis not present

## 2019-03-29 DIAGNOSIS — Z8673 Personal history of transient ischemic attack (TIA), and cerebral infarction without residual deficits: Secondary | ICD-10-CM | POA: Diagnosis not present

## 2019-03-29 DIAGNOSIS — R0989 Other specified symptoms and signs involving the circulatory and respiratory systems: Secondary | ICD-10-CM | POA: Diagnosis not present

## 2019-03-29 DIAGNOSIS — I10 Essential (primary) hypertension: Secondary | ICD-10-CM

## 2019-03-29 DIAGNOSIS — I868 Varicose veins of other specified sites: Secondary | ICD-10-CM | POA: Diagnosis not present

## 2019-03-29 DIAGNOSIS — E785 Hyperlipidemia, unspecified: Secondary | ICD-10-CM

## 2019-03-29 DIAGNOSIS — R972 Elevated prostate specific antigen [PSA]: Secondary | ICD-10-CM | POA: Insufficient documentation

## 2019-03-29 DIAGNOSIS — I739 Peripheral vascular disease, unspecified: Secondary | ICD-10-CM

## 2019-03-29 DIAGNOSIS — Z862 Personal history of diseases of the blood and blood-forming organs and certain disorders involving the immune mechanism: Secondary | ICD-10-CM | POA: Diagnosis not present

## 2019-03-29 MED ORDER — AMLODIPINE BESYLATE 5 MG PO TABS: 5.0000 mg | ORAL_TABLET | Freq: Every day | ORAL | 3 refills | Status: DC

## 2019-03-29 MED ORDER — LOSARTAN POTASSIUM-HCTZ 100-25 MG PO TABS: 1.0000 | ORAL_TABLET | Freq: Every day | ORAL | 3 refills | Status: DC

## 2019-03-29 NOTE — Patient Instructions (Signed)
DASH Eating Plan DASH stands for "Dietary Approaches to Stop Hypertension." The DASH eating plan is a healthy eating plan that has been shown to reduce high blood pressure (hypertension). It may also reduce your risk for type 2 diabetes, heart disease, and stroke. The DASH eating plan may also help with weight loss. What are tips for following this plan?  General guidelines  Avoid eating more than 2,300 mg (milligrams) of salt (sodium) a day. If you have hypertension, you may need to reduce your sodium intake to 1,500 mg a day.  Limit alcohol intake to no more than 1 drink a day for nonpregnant women and 2 drinks a day for men. One drink equals 12 oz of beer, 5 oz of wine, or 1 oz of hard liquor.  Work with your health care provider to maintain a healthy body weight or to lose weight. Ask what an ideal weight is for you.  Get at least 30 minutes of exercise that causes your heart to beat faster (aerobic exercise) most days of the week. Activities may include walking, swimming, or biking.  Work with your health care provider or diet and nutrition specialist (dietitian) to adjust your eating plan to your individual calorie needs. Reading food labels   Check food labels for the amount of sodium per serving. Choose foods with less than 5 percent of the Daily Value of sodium. Generally, foods with less than 300 mg of sodium per serving fit into this eating plan.  To find whole grains, look for the word "whole" as the first word in the ingredient list. Shopping  Buy products labeled as "low-sodium" or "no salt added."  Buy fresh foods. Avoid canned foods and premade or frozen meals. Cooking  Avoid adding salt when cooking. Use salt-free seasonings or herbs instead of table salt or sea salt. Check with your health care provider or pharmacist before using salt substitutes.  Do not fry foods. Cook foods using healthy methods such as baking, boiling, grilling, and broiling instead.  Cook with  heart-healthy oils, such as olive, canola, soybean, or sunflower oil. Meal planning  Eat a balanced diet that includes: ? 5 or more servings of fruits and vegetables each day. At each meal, try to fill half of your plate with fruits and vegetables. ? Up to 6-8 servings of whole grains each day. ? Less than 6 oz of lean meat, poultry, or fish each day. A 3-oz serving of meat is about the same size as a deck of cards. One egg equals 1 oz. ? 2 servings of low-fat dairy each day. ? A serving of nuts, seeds, or beans 5 times each week. ? Heart-healthy fats. Healthy fats called Omega-3 fatty acids are found in foods such as flaxseeds and coldwater fish, like sardines, salmon, and mackerel.  Limit how much you eat of the following: ? Canned or prepackaged foods. ? Food that is high in trans fat, such as fried foods. ? Food that is high in saturated fat, such as fatty meat. ? Sweets, desserts, sugary drinks, and other foods with added sugar. ? Full-fat dairy products.  Do not salt foods before eating.  Try to eat at least 2 vegetarian meals each week.  Eat more home-cooked food and less restaurant, buffet, and fast food.  When eating at a restaurant, ask that your food be prepared with less salt or no salt, if possible. What foods are recommended? The items listed may not be a complete list. Talk with your dietitian about   what dietary choices are best for you. Grains Whole-grain or whole-wheat bread. Whole-grain or whole-wheat pasta. Brown rice. Oatmeal. Quinoa. Bulgur. Whole-grain and low-sodium cereals. Pita bread. Low-fat, low-sodium crackers. Whole-wheat flour tortillas. Vegetables Fresh or frozen vegetables (raw, steamed, roasted, or grilled). Low-sodium or reduced-sodium tomato and vegetable juice. Low-sodium or reduced-sodium tomato sauce and tomato paste. Low-sodium or reduced-sodium canned vegetables. Fruits All fresh, dried, or frozen fruit. Canned fruit in natural juice (without  added sugar). Meat and other protein foods Skinless chicken or turkey. Ground chicken or turkey. Pork with fat trimmed off. Fish and seafood. Egg whites. Dried beans, peas, or lentils. Unsalted nuts, nut butters, and seeds. Unsalted canned beans. Lean cuts of beef with fat trimmed off. Low-sodium, lean deli meat. Dairy Low-fat (1%) or fat-free (skim) milk. Fat-free, low-fat, or reduced-fat cheeses. Nonfat, low-sodium ricotta or cottage cheese. Low-fat or nonfat yogurt. Low-fat, low-sodium cheese. Fats and oils Soft margarine without trans fats. Vegetable oil. Low-fat, reduced-fat, or light mayonnaise and salad dressings (reduced-sodium). Canola, safflower, olive, soybean, and sunflower oils. Avocado. Seasoning and other foods Herbs. Spices. Seasoning mixes without salt. Unsalted popcorn and pretzels. Fat-free sweets. What foods are not recommended? The items listed may not be a complete list. Talk with your dietitian about what dietary choices are best for you. Grains Baked goods made with fat, such as croissants, muffins, or some breads. Dry pasta or rice meal packs. Vegetables Creamed or fried vegetables. Vegetables in a cheese sauce. Regular canned vegetables (not low-sodium or reduced-sodium). Regular canned tomato sauce and paste (not low-sodium or reduced-sodium). Regular tomato and vegetable juice (not low-sodium or reduced-sodium). Pickles. Olives. Fruits Canned fruit in a light or heavy syrup. Fried fruit. Fruit in cream or butter sauce. Meat and other protein foods Fatty cuts of meat. Ribs. Fried meat. Bacon. Sausage. Bologna and other processed lunch meats. Salami. Fatback. Hotdogs. Bratwurst. Salted nuts and seeds. Canned beans with added salt. Canned or smoked fish. Whole eggs or egg yolks. Chicken or turkey with skin. Dairy Whole or 2% milk, cream, and half-and-half. Whole or full-fat cream cheese. Whole-fat or sweetened yogurt. Full-fat cheese. Nondairy creamers. Whipped toppings.  Processed cheese and cheese spreads. Fats and oils Butter. Stick margarine. Lard. Shortening. Ghee. Bacon fat. Tropical oils, such as coconut, palm kernel, or palm oil. Seasoning and other foods Salted popcorn and pretzels. Onion salt, garlic salt, seasoned salt, table salt, and sea salt. Worcestershire sauce. Tartar sauce. Barbecue sauce. Teriyaki sauce. Soy sauce, including reduced-sodium. Steak sauce. Canned and packaged gravies. Fish sauce. Oyster sauce. Cocktail sauce. Horseradish that you find on the shelf. Ketchup. Mustard. Meat flavorings and tenderizers. Bouillon cubes. Hot sauce and Tabasco sauce. Premade or packaged marinades. Premade or packaged taco seasonings. Relishes. Regular salad dressings. Where to find more information:  National Heart, Lung, and Blood Institute: www.nhlbi.nih.gov  American Heart Association: www.heart.org Summary  The DASH eating plan is a healthy eating plan that has been shown to reduce high blood pressure (hypertension). It may also reduce your risk for type 2 diabetes, heart disease, and stroke.  With the DASH eating plan, you should limit salt (sodium) intake to 2,300 mg a day. If you have hypertension, you may need to reduce your sodium intake to 1,500 mg a day.  When on the DASH eating plan, aim to eat more fresh fruits and vegetables, whole grains, lean proteins, low-fat dairy, and heart-healthy fats.  Work with your health care provider or diet and nutrition specialist (dietitian) to adjust your eating plan to your   individual calorie needs. This information is not intended to replace advice given to you by your health care provider. Make sure you discuss any questions you have with your health care provider. Document Revised: 12/09/2016 Document Reviewed: 12/21/2015 Elsevier Patient Education  2020 Elsevier Inc.  

## 2019-03-29 NOTE — Addendum Note (Signed)
Addended by: Lennie Muckle on: 03/29/2019 10:25 AM   Modules accepted: Orders

## 2019-03-30 ENCOUNTER — Telehealth: Payer: Self-pay | Admitting: Internal Medicine

## 2019-03-30 DIAGNOSIS — N390 Urinary tract infection, site not specified: Secondary | ICD-10-CM

## 2019-03-30 DIAGNOSIS — N4289 Other specified disorders of prostate: Secondary | ICD-10-CM

## 2019-03-30 DIAGNOSIS — R972 Elevated prostate specific antigen [PSA]: Secondary | ICD-10-CM

## 2019-03-30 LAB — URINALYSIS, COMPLETE
Bilirubin Urine: NEGATIVE
Glucose, UA: NEGATIVE
Hgb urine dipstick: NEGATIVE
Hyaline Cast: NONE SEEN /LPF
Ketones, ur: NEGATIVE
Nitrite: POSITIVE — AB
Protein, ur: NEGATIVE
RBC / HPF: NONE SEEN /HPF (ref 0–2)
Specific Gravity, Urine: 1.013 (ref 1.001–1.03)
Squamous Epithelial / HPF: NONE SEEN /HPF (ref <=5)
pH: 6.5 (ref 5.0–8.0)

## 2019-03-30 LAB — CBC WITH DIFFERENTIAL/PLATELET
Absolute Monocytes: 748 cells/uL (ref 200–950)
Basophils Absolute: 20 cells/uL (ref 0–200)
Basophils Relative: 0.3 %
Eosinophils Absolute: 211 cells/uL (ref 15–500)
Eosinophils Relative: 3.1 %
HCT: 37.7 % — ABNORMAL LOW (ref 38.5–50.0)
Hemoglobin: 12.7 g/dL — ABNORMAL LOW (ref 13.2–17.1)
Lymphs Abs: 1510 cells/uL (ref 850–3900)
MCH: 31.9 pg (ref 27.0–33.0)
MCHC: 33.7 g/dL (ref 32.0–36.0)
MCV: 94.7 fL (ref 80.0–100.0)
MPV: 10.5 fL (ref 7.5–12.5)
Monocytes Relative: 11 %
Neutro Abs: 4311 cells/uL (ref 1500–7800)
Neutrophils Relative %: 63.4 %
Platelets: 255 10*3/uL (ref 140–400)
RBC: 3.98 10*6/uL — ABNORMAL LOW (ref 4.20–5.80)
RDW: 11.8 % (ref 11.0–15.0)
Total Lymphocyte: 22.2 %
WBC: 6.8 10*3/uL (ref 3.8–10.8)

## 2019-03-30 LAB — COMPLETE METABOLIC PANEL WITH GFR
AG Ratio: 1.7 (calc) (ref 1.0–2.5)
ALT: 17 U/L (ref 9–46)
AST: 19 U/L (ref 10–35)
Albumin: 4.3 g/dL (ref 3.6–5.1)
Alkaline phosphatase (APISO): 67 U/L (ref 35–144)
BUN: 14 mg/dL (ref 7–25)
CO2: 28 mmol/L (ref 20–32)
Calcium: 9.5 mg/dL (ref 8.6–10.3)
Chloride: 104 mmol/L (ref 98–110)
Creat: 1.23 mg/dL (ref 0.70–1.25)
GFR, Est African American: 70 mL/min/{1.73_m2} (ref >=60)
GFR, Est Non African American: 60 mL/min/{1.73_m2} (ref >=60)
Globulin: 2.5 g/dL (calc) (ref 1.9–3.7)
Glucose, Bld: 95 mg/dL (ref 65–99)
Potassium: 5 mmol/L (ref 3.5–5.3)
Sodium: 140 mmol/L (ref 135–146)
Total Bilirubin: 0.5 mg/dL (ref 0.2–1.2)
Total Protein: 6.8 g/dL (ref 6.1–8.1)

## 2019-03-30 LAB — LIPID PANEL
Cholesterol: 156 mg/dL (ref <200)
HDL: 65 mg/dL (ref >=40)
LDL Cholesterol (Calc): 77 mg/dL (calc)
Non-HDL Cholesterol (Calc): 91 mg/dL (calc) (ref <130)
Total CHOL/HDL Ratio: 2.4 (calc) (ref <5.0)
Triglycerides: 49 mg/dL (ref <150)

## 2019-03-30 LAB — HEMOGLOBIN A1C
Hgb A1c MFr Bld: 5.2 % of total Hgb (ref <5.7)
Mean Plasma Glucose: 103 (calc)
eAG (mmol/L): 5.7 (calc)

## 2019-03-30 LAB — IRON,TIBC AND FERRITIN PANEL
%SAT: 27 % (calc) (ref 20–48)
Ferritin: 132 ng/mL (ref 24–380)
Iron: 78 ug/dL (ref 50–180)
TIBC: 284 mcg/dL (calc) (ref 250–425)

## 2019-03-30 LAB — PSA: PSA: 8.3 ng/mL — ABNORMAL HIGH (ref <=4.0)

## 2019-03-30 LAB — TSH: TSH: 1.03 mIU/L (ref 0.40–4.50)

## 2019-03-30 MED ORDER — CIPROFLOXACIN HCL 500 MG PO TABS: 500.0000 mg | ORAL_TABLET | Freq: Two times a day (BID) | ORAL | 1 refills | Status: DC

## 2019-03-30 NOTE — Telephone Encounter (Signed)
I tried to call the patient with the cell phone number provided in his chart at 0930 on Sat 03/30/19 and a woman answers the VM and states "hey its me, you know what to do".  I left a VM stating I was trying to reach [name], and just wanted to review his lab results from yesterday's visit and I would try to call back later this morning.No urgency noted with the labs, and if cannot reach later this morning, I will try again Monday at the latest. I was not comfortable leaving any further details given the message. I tried again at about 1000 and again same message received. Left VM was me again, and again noted no urgency and will try to reach again on Monday at the latest.  His labs had an elevated PSA and an abnormal UA with concerns for possible infection. Given yesterday's assessment, feel not an emergent issue, but do want to get an abx started, and will ask if he can provide a urine first thing Monday morning and send for UA with C & S and then start the abx, ciprofloxacin as prescribed as concern for prostate component. Will refer to urology again as well with increased PSA noted Lab Results  Component Value Date   PSA1 4.0 07/21/2014   PSA 8.3 (H) 03/29/2019   PSA 3.3 09/19/2017   PSA 4.3 (H) 02/16/2017   He remains mildly anemic, normochromic/normocytic and iron panel normal as was his complete metabolic panel, TSH, lipid panel, and A1C.  1. Increased prostate specific antigen (PSA) velocity  - Ambulatory referral to Urology  2. Urinary tract infection without hematuria, site unspecified,  - Urinalysis, Complete - Urine Culture; Future - ciprofloxacin (CIPRO) 500 MG tablet; Take 1 tablet (500 mg total) by mouth 2 (two) times daily. Continue for another week if symptoms not resolved after first 7 days.  Dispense: 28 tablet; Refill: 1 - Ambulatory referral to Urology  3. Asymmetric prostate  - Ambulatory referral to Urology

## 2019-04-01 ENCOUNTER — Ambulatory Visit (INDEPENDENT_AMBULATORY_CARE_PROVIDER_SITE_OTHER): Payer: Medicare HMO | Admitting: Surgery

## 2019-04-01 ENCOUNTER — Telehealth: Payer: Self-pay

## 2019-04-01 ENCOUNTER — Other Ambulatory Visit: Payer: Self-pay

## 2019-04-01 ENCOUNTER — Encounter: Payer: Self-pay | Admitting: Surgery

## 2019-04-01 VITALS — BP 156/97 | HR 83 | Temp 98.2°F | Resp 12 | Ht 64.0 in | Wt 159.4 lb

## 2019-04-01 DIAGNOSIS — K429 Umbilical hernia without obstruction or gangrene: Secondary | ICD-10-CM | POA: Diagnosis not present

## 2019-04-01 NOTE — Telephone Encounter (Signed)
Third attempt to contact patient in regards to leaving a urine sample for testing. [phone] was found in his chart listed as his. I called the number, no answer, no voicemail available.

## 2019-04-01 NOTE — H&P (View-Only) (Signed)
04/01/2019  Reason for Visit: Umbilical hernia  Referring Provider:  Lebron Conners, MD  History of Present Illness: Xavier [name] is a 69 y.o. male presenting for evaluation of an umbilical hernia.  Patient reports that he has had this hernia for about 2 to 3 years.  This has gradually increased in size.  He denies any prior abdominal surgeries but does report heavy lifting as part of his previous work although now he is retired.  He reports that sometimes the hernia area is painful and sometimes it bulges and becomes very hard.  He is able to push it in for the most part and when it is hard and more painful, he lies down and it goes down on its own.  He denies any fevers, chills, chest pain, shortness of breath, nausea, vomiting.  Denies any constipation or diarrhea.  He does report having some difficulty urinating and that sometimes with straining it makes the hernia bulge become sore.  Of note, he does have a history of a stroke in 2013 although he reports that there are no residual symptoms.  He is on aspirin and a statin.  He also has a history of hypertension, hyperlipidemia, and was positive for Covid in July 2020.  More recently, when he saw his PCP last week, his laboratory work-up showed an elevated PSA of 8.3 as well as a positive UA with 3+ leukocyte esterase as well as positive nitrite.  His PCPs office has tried to contact him but has only been able to leave voicemails.  The patient reports that he does not have a phone anymore but his wife does and it is the contact number at this point.  Past Medical History: Past Medical History:  Diagnosis Date  . Family hx of colon cancer requiring screening colonoscopy 06/21/2016   Father at age 2  . History of CVA (cerebrovascular accident) 06/22/2011   Overview:  right sided stroke in 2013 (basal ganglia, right insular region, and white matter of the left frontal lobe   . Hyperlipidemia   . Hypertension      Past Surgical  History: Past Surgical History:  Procedure Laterality Date  . TONSILLECTOMY  1958    Home Medications: Prior to Admission medications   Medication Sig Start Date End Date Taking? Authorizing Provider  amLODipine (NORVASC) 5 MG tablet Take 1 tablet (5 mg total) by mouth daily. 03/29/19  Yes Towanda Malkin, MD  aspirin EC 81 MG tablet Take 1 tablet (81 mg total) by mouth daily. 02/03/16  Yes Lada, Satira Anis, MD  ciprofloxacin (CIPRO) 500 MG tablet Take 1 tablet (500 mg total) by mouth 2 (two) times daily. Continue for another week if symptoms not resolved after first 7 days. 03/30/19  Yes Towanda Malkin, MD  losartan-hydrochlorothiazide (HYZAAR) 100-25 MG tablet Take 1 tablet by mouth daily. 03/29/19  Yes Towanda Malkin, MD  pravastatin (PRAVACHOL) 40 MG tablet Take 1 tablet (40 mg total) by mouth at bedtime. 02/22/19  Yes Towanda Malkin, MD    Allergies: No Known Allergies  Social History:  reports that he has never smoked. He has never used smokeless tobacco. He reports that he does not drink alcohol or use drugs.   Family History: Family History  Problem Relation Age of Onset  . Arthritis Mother   . Kidney disease Mother   . Heart disease Father   . Cancer Father        lung, colon  . Diabetes Brother   .  Cancer Brother        stomach tumor  . Glaucoma Maternal Grandmother   . Kidney disease Paternal Grandmother   . Neuropathy Brother     Review of Systems: Review of Systems  Constitutional: Negative for chills and fever.  HENT: Negative for hearing loss.   Respiratory: Negative for shortness of breath.   Cardiovascular: Negative for chest pain.  Gastrointestinal: Positive for abdominal pain. Negative for constipation, diarrhea, nausea and vomiting.  Genitourinary: Positive for frequency and urgency. Negative for dysuria.  Musculoskeletal: Negative for myalgias.  Skin: Negative for rash.  Neurological: Negative for dizziness.   Psychiatric/Behavioral: Negative for depression.    Physical Exam BP (!) 156/97   Pulse 83   Temp 98.2 F (36.8 C) (Temporal)   Resp 12   Ht 5\' 4"  (1.626 m)   Wt 159 lb 6.4 oz (72.3 kg)   SpO2 95%   BMI 27.36 kg/m  CONSTITUTIONAL: No acute distress HEENT:  Normocephalic, atraumatic, extraocular motion intact. NECK: Trachea is midline, and there is no jugular venous distension.  RESPIRATORY:  Lungs are clear, and breath sounds are equal bilaterally. Normal respiratory effort without pathologic use of accessory muscles. CARDIOVASCULAR: Heart is regular without murmurs, gallops, or rubs. GI: The abdomen is soft, nondistended, currently nontender to palpation.  Patient has an umbilical hernia that measures approximately 2 cm in size.  It is reducible and currently with no significant tenderness.  He does have excess skin at his umbilicus from the hernia itself.  Both inguinal regions were also examined and there is no evidence of inguinal hernias.   MUSCULOSKELETAL:  Normal muscle strength and tone in all four extremities.  No peripheral edema or cyanosis. SKIN: Skin turgor is normal. There are no pathologic skin lesions.  NEUROLOGIC:  Motor and sensation is grossly normal.  Cranial nerves are grossly intact. PSYCH:  Alert and oriented to person, place and time. Affect is normal.  Laboratory Analysis: Labs from 03/29/2019: Sodium 140, potassium 5, chloride 104, CO2 28, BUN 14, creatinine 1.23.  Total bilirubin 0.5, AST 19, ALT 17, alkaline phosphatase 67.  WBC 6.8, hemoglobin 12.7, hematocrit 37.7, platelet count 255.  TSH 1.03.  Hemoglobin A1c 5.2.  PSA 8.3.  Urinalysis positive for leukocyte esterase and nitrite with few bacteria seen.  Imaging: No results found.  Assessment and Plan: This is a 69 y.o. male with umbilical hernia that is symptomatic.  -Discussed with the patient that from the hernia standpoint, we can offer him an open umbilical hernia repair.  I think given the size  of the defect, we would do it with mesh to reinforce the repair.  I think the defect is small enough that we can do this through open fashion rather than trying to do this via laparoscopy or robotically.  This will also allow [place] to excise any excess skin from the umbilicus and be able to tack his umbilical stalk better.  Discussed with him the risks of bleeding, infection, injury to surrounding structures.  He is willing to proceed. -Discussed with him that given his elevated PSA, I do agree with referral to urology.  His PSA could potentially be elevated if he has prostatitis but I think is better to be evaluated by urology.  I did inform the patient that his PCPs office has been trying to contact him and that he should call them when he leaves this appointment.  At this point I do not think that we would need to delay surgery for his  work-up, I will confirm this with urology as well. -We will send a clearance from his PCPs office.  If there are no issues from the urology standpoint, we will schedule him for 04/17/2018 1 in the afternoon.  He understands that he would need a Covid test prior to surgery.  Face-to-face time spent with the patient and care providers was 60 minutes, with more than 50% of the time spent counseling, educating, and coordinating care of the patient.     Melvyn Neth, [facility]

## 2019-04-01 NOTE — Progress Notes (Signed)
04/01/2019  Reason for Visit: Umbilical hernia  Referring Provider:  Lebron Conners, MD  History of Present Illness: Xavier [name] is a 69 y.o. male presenting for evaluation of an umbilical hernia.  Patient reports that he has had this hernia for about 2 to 3 years.  This has gradually increased in size.  He denies any prior abdominal surgeries but does report heavy lifting as part of his previous work although now he is retired.  He reports that sometimes the hernia area is painful and sometimes it bulges and becomes very hard.  He is able to push it in for the most part and when it is hard and more painful, he lies down and it goes down on its own.  He denies any fevers, chills, chest pain, shortness of breath, nausea, vomiting.  Denies any constipation or diarrhea.  He does report having some difficulty urinating and that sometimes with straining it makes the hernia bulge become sore.  Of note, he does have a history of a stroke in 2013 although he reports that there are no residual symptoms.  He is on aspirin and a statin.  He also has a history of hypertension, hyperlipidemia, and was positive for Covid in July 2020.  More recently, when he saw his PCP last week, his laboratory work-up showed an elevated PSA of 8.3 as well as a positive UA with 3+ leukocyte esterase as well as positive nitrite.  His PCPs office has tried to contact him but has only been able to leave voicemails.  The patient reports that he does not have a phone anymore but his wife does and it is the contact number at this point.  Past Medical History: Past Medical History:  Diagnosis Date  . Family hx of colon cancer requiring screening colonoscopy 06/21/2016   Father at age 20  . History of CVA (cerebrovascular accident) 06/22/2011   Overview:  right sided stroke in 2013 (basal ganglia, right insular region, and white matter of the left frontal lobe   . Hyperlipidemia   . Hypertension      Past Surgical  History: Past Surgical History:  Procedure Laterality Date  . TONSILLECTOMY  1958    Home Medications: Prior to Admission medications   Medication Sig Start Date End Date Taking? Authorizing Provider  amLODipine (NORVASC) 5 MG tablet Take 1 tablet (5 mg total) by mouth daily. 03/29/19  Yes Towanda Malkin, MD  aspirin EC 81 MG tablet Take 1 tablet (81 mg total) by mouth daily. 02/03/16  Yes Lada, Satira Anis, MD  ciprofloxacin (CIPRO) 500 MG tablet Take 1 tablet (500 mg total) by mouth 2 (two) times daily. Continue for another week if symptoms not resolved after first 7 days. 03/30/19  Yes Towanda Malkin, MD  losartan-hydrochlorothiazide (HYZAAR) 100-25 MG tablet Take 1 tablet by mouth daily. 03/29/19  Yes Towanda Malkin, MD  pravastatin (PRAVACHOL) 40 MG tablet Take 1 tablet (40 mg total) by mouth at bedtime. 02/22/19  Yes Towanda Malkin, MD    Allergies: No Known Allergies  Social History:  reports that he has never smoked. He has never used smokeless tobacco. He reports that he does not drink alcohol or use drugs.   Family History: Family History  Problem Relation Age of Onset  . Arthritis Mother   . Kidney disease Mother   . Heart disease Father   . Cancer Father        lung, colon  . Diabetes Brother   .  Cancer Brother        stomach tumor  . Glaucoma Maternal Grandmother   . Kidney disease Paternal Grandmother   . Neuropathy Brother     Review of Systems: Review of Systems  Constitutional: Negative for chills and fever.  HENT: Negative for hearing loss.   Respiratory: Negative for shortness of breath.   Cardiovascular: Negative for chest pain.  Gastrointestinal: Positive for abdominal pain. Negative for constipation, diarrhea, nausea and vomiting.  Genitourinary: Positive for frequency and urgency. Negative for dysuria.  Musculoskeletal: Negative for myalgias.  Skin: Negative for rash.  Neurological: Negative for dizziness.   Psychiatric/Behavioral: Negative for depression.    Physical Exam BP (!) 156/97   Pulse 83   Temp 98.2 F (36.8 C) (Temporal)   Resp 12   Ht 5\' 4"  (1.626 m)   Wt 159 lb 6.4 oz (72.3 kg)   SpO2 95%   BMI 27.36 kg/m  CONSTITUTIONAL: No acute distress HEENT:  Normocephalic, atraumatic, extraocular motion intact. NECK: Trachea is midline, and there is no jugular venous distension.  RESPIRATORY:  Lungs are clear, and breath sounds are equal bilaterally. Normal respiratory effort without pathologic use of accessory muscles. CARDIOVASCULAR: Heart is regular without murmurs, gallops, or rubs. GI: The abdomen is soft, nondistended, currently nontender to palpation.  Patient has an umbilical hernia that measures approximately 2 cm in size.  It is reducible and currently with no significant tenderness.  He does have excess skin at his umbilicus from the hernia itself.  Both inguinal regions were also examined and there is no evidence of inguinal hernias.   MUSCULOSKELETAL:  Normal muscle strength and tone in all four extremities.  No peripheral edema or cyanosis. SKIN: Skin turgor is normal. There are no pathologic skin lesions.  NEUROLOGIC:  Motor and sensation is grossly normal.  Cranial nerves are grossly intact. PSYCH:  Alert and oriented to person, place and time. Affect is normal.  Laboratory Analysis: Labs from 03/29/2019: Sodium 140, potassium 5, chloride 104, CO2 28, BUN 14, creatinine 1.23.  Total bilirubin 0.5, AST 19, ALT 17, alkaline phosphatase 67.  WBC 6.8, hemoglobin 12.7, hematocrit 37.7, platelet count 255.  TSH 1.03.  Hemoglobin A1c 5.2.  PSA 8.3.  Urinalysis positive for leukocyte esterase and nitrite with few bacteria seen.  Imaging: No results found.  Assessment and Plan: This is a 69 y.o. male with umbilical hernia that is symptomatic.  -Discussed with the patient that from the hernia standpoint, we can offer him an open umbilical hernia repair.  I think given the size  of the defect, we would do it with mesh to reinforce the repair.  I think the defect is small enough that we can do this through open fashion rather than trying to do this via laparoscopy or robotically.  This will also allow [place] to excise any excess skin from the umbilicus and be able to tack his umbilical stalk better.  Discussed with him the risks of bleeding, infection, injury to surrounding structures.  He is willing to proceed. -Discussed with him that given his elevated PSA, I do agree with referral to urology.  His PSA could potentially be elevated if he has prostatitis but I think is better to be evaluated by urology.  I did inform the patient that his PCPs office has been trying to contact him and that he should call them when he leaves this appointment.  At this point I do not think that we would need to delay surgery for his  work-up, I will confirm this with urology as well. -We will send a clearance from his PCPs office.  If there are no issues from the urology standpoint, we will schedule him for 04/17/2018 1 in the afternoon.  He understands that he would need a Covid test prior to surgery.  Face-to-face time spent with the patient and care providers was 60 minutes, with more than 50% of the time spent counseling, educating, and coordinating care of the patient.     Melvyn Neth, [facility]

## 2019-04-01 NOTE — Telephone Encounter (Signed)
Medical Clearance faxed to Raelyn Ensign [phone].

## 2019-04-01 NOTE — Patient Instructions (Addendum)
Our surgery scheduler will call to confirm your surgery date. Please have the [facility] surgery sheet available when speaking with her.   Stop the Aspirin 04/13/19.    Umbilical Hernia, Adult  A hernia is a bulge of tissue that pushes through an opening between muscles. An umbilical hernia happens in the abdomen, near the belly button (umbilicus). The hernia may contain tissues from the small intestine, large intestine, or fatty tissue covering the intestines (omentum). Umbilical hernias in adults tend to get worse over time, and they require surgical treatment. There are several types of umbilical hernias. You may have:  A hernia located just above or below the umbilicus (indirect hernia). This is the most common type of umbilical hernia in adults.  A hernia that forms through an opening formed by the umbilicus (direct hernia).  A hernia that comes and goes (reducible hernia). A reducible hernia may be visible only when you strain, lift something heavy, or cough. This type of hernia can be pushed back into the abdomen (reduced).  A hernia that traps abdominal tissue inside the hernia (incarcerated hernia). This type of hernia cannot be reduced.  A hernia that cuts off blood flow to the tissues inside the hernia (strangulated hernia). The tissues can start to die if this happens. This type of hernia requires emergency treatment. What are the causes? An umbilical hernia happens when tissue inside the abdomen presses on a weak area of the abdominal muscles. What increases the risk? You may have a greater risk of this condition if you:  Are obese.  Have had several pregnancies.  Have a buildup of fluid inside your abdomen (ascites).  Have had surgery that weakens the abdominal muscles. What are the signs or symptoms? The main symptom of this condition is a painless bulge at or near the belly button. A reducible hernia may be visible only when you strain, lift something heavy, or cough. Other  symptoms may include:  Dull pain.  A feeling of pressure. Symptoms of a strangulated hernia may include:  Pain that gets increasingly worse.  Nausea and vomiting.  Pain when pressing on the hernia.  Skin over the hernia becoming red or purple.  Constipation.  Blood in the stool. How is this diagnosed? This condition may be diagnosed based on:  A physical exam. You may be asked to cough or strain while standing. These actions increase the pressure inside your abdomen and force the hernia through the opening in your muscles. Your health care provider may try to reduce the hernia by pressing on it.  Your symptoms and medical history. How is this treated? Surgery is the only treatment for an umbilical hernia. Surgery for a strangulated hernia is done as soon as possible. If you have a small hernia that is not incarcerated, you may need to lose weight before having surgery. Follow these instructions at home:  Lose weight, if told by your health care provider.  Do not try to push the hernia back in.  Watch your hernia for any changes in color or size. Tell your health care provider if any changes occur.  You may need to avoid activities that increase pressure on your hernia.  Do not lift anything that is heavier than 10 lb (4.5 kg) until your health care provider says that this is safe.  Take over-the-counter and prescription medicines only as told by your health care provider.  Keep all follow-up visits as told by your health care provider. This is important. Contact a health  care provider if:  Your hernia gets larger.  Your hernia becomes painful. Get help right away if:  You develop sudden, severe pain near the area of your hernia.  You have pain as well as nausea or vomiting.  You have pain and the skin over your hernia changes color.  You develop a fever. This information is not intended to replace advice given to you by your health care provider. Make sure you  discuss any questions you have with your health care provider. Document Revised: 02/08/2017 Document Reviewed: 06/27/2016 Elsevier Patient Education  [place].

## 2019-04-01 NOTE — Telephone Encounter (Signed)
Tried to call patient at 4:45pm in hopes of reaching him after work .  Left vm to call office back tomorrow and ask to speak to someone directly.

## 2019-04-01 NOTE — Telephone Encounter (Signed)
Fourth attempt to contact patient on #[phone], no answer, left vm. 2nd attempt on # [phone], no answer. I will route message to [facility] pool and have informed front desk and Cathrine Muster of instructions for patient if he should return the call.

## 2019-04-01 NOTE — Telephone Encounter (Signed)
Called patient x2, no answer, left vm for patient to return call about important information.

## 2019-04-02 ENCOUNTER — Telehealth: Payer: Self-pay | Admitting: Surgery

## 2019-04-02 NOTE — Telephone Encounter (Signed)
Called patient, no answer, left another voicemail for them to return call regarding important information.

## 2019-04-02 NOTE — Telephone Encounter (Signed)
Outbound call made & message left requesting a call back to review the following info:  Surgery Date: 04/17/19 Preadmission Testing Date: 04/09/19 (phone 1p-5p) Covid Testing Date: 04/15/19 - patient advised to go to the [facility] ([place])  Please make sure the pt is aware to call [phone], between 1-3:00pm the day before surgery, to find out what time to arrive.    Thank you

## 2019-04-03 ENCOUNTER — Other Ambulatory Visit: Payer: Self-pay

## 2019-04-03 DIAGNOSIS — R350 Frequency of micturition: Secondary | ICD-10-CM | POA: Diagnosis not present

## 2019-04-03 NOTE — Telephone Encounter (Signed)
Called patient's number, spoke with his wife and she provided his direct number of [phone]. I called the patient and there was no answer and no voicemail for me to leave a message. I will try to contact the patient later.

## 2019-04-03 NOTE — Telephone Encounter (Signed)
Got in touch with patient, he started his antibiotic yesterday 04/02/2019. I told the patient I would call him back with further instruction. Returned call, no answer. Patient should still come by and leave a urine sample TODAY per Dr. Roxan Hockey.

## 2019-04-04 LAB — URINALYSIS, COMPLETE
Bilirubin Urine: NEGATIVE
Glucose, UA: NEGATIVE
Hgb urine dipstick: NEGATIVE
Hyaline Cast: NONE SEEN /LPF
Ketones, ur: NEGATIVE
Nitrite: POSITIVE — AB
Protein, ur: NEGATIVE
RBC / HPF: NONE SEEN /HPF (ref 0–2)
Specific Gravity, Urine: 1.009 (ref 1.001–1.03)
pH: 7 (ref 5.0–8.0)

## 2019-04-04 LAB — URINE CULTURE
MICRO NUMBER:: 10289524
Result:: NO GROWTH
SPECIMEN QUALITY:: ADEQUATE

## 2019-04-08 ENCOUNTER — Encounter: Payer: Self-pay | Admitting: Urology

## 2019-04-08 ENCOUNTER — Ambulatory Visit (INDEPENDENT_AMBULATORY_CARE_PROVIDER_SITE_OTHER): Payer: Medicare HMO | Admitting: Urology

## 2019-04-08 ENCOUNTER — Other Ambulatory Visit: Payer: Self-pay

## 2019-04-08 VITALS — BP 129/80 | HR 74 | Ht 68.0 in | Wt 159.0 lb

## 2019-04-08 DIAGNOSIS — R350 Frequency of micturition: Secondary | ICD-10-CM | POA: Diagnosis not present

## 2019-04-08 DIAGNOSIS — N138 Other obstructive and reflux uropathy: Secondary | ICD-10-CM | POA: Diagnosis not present

## 2019-04-08 DIAGNOSIS — R972 Elevated prostate specific antigen [PSA]: Secondary | ICD-10-CM

## 2019-04-08 DIAGNOSIS — N390 Urinary tract infection, site not specified: Secondary | ICD-10-CM

## 2019-04-08 DIAGNOSIS — N401 Enlarged prostate with lower urinary tract symptoms: Secondary | ICD-10-CM

## 2019-04-08 LAB — URINALYSIS, COMPLETE
Bilirubin, UA: NEGATIVE
Glucose, UA: NEGATIVE
Ketones, UA: NEGATIVE
Nitrite, UA: NEGATIVE
Protein,UA: NEGATIVE
RBC, UA: NEGATIVE
Specific Gravity, UA: 1.02 (ref 1.005–1.030)
Urobilinogen, Ur: 0.2 mg/dL (ref 0.2–1.0)
pH, UA: 7 (ref 5.0–7.5)

## 2019-04-08 LAB — MICROSCOPIC EXAMINATION
Bacteria, UA: NONE SEEN
RBC, Urine: NONE SEEN /hpf (ref 0–2)

## 2019-04-08 LAB — BLADDER SCAN AMB NON-IMAGING: Scan Result: 453

## 2019-04-08 NOTE — Progress Notes (Signed)
04/08/19 1:56 PM   Xavier [name] April 28, 1950 [id number]  CC: BPH and urinary symptoms, elevated PSA, recurrent UTIs  HPI: I saw Mr. Bauguess in urology clinic for evaluation of elevated PSA as well as BPH and urinary symptoms.  He reports a long multiyear history of difficulty urinating with weak stream, intermittency, and incontinence overnight.  He also reports multiple UTIs, as well as an episode of urinary retention approximately 10 years ago that required catheter placement at [facility].  His baseline PSA is 3.3, however he recently had his PSA checked and it was elevated at 8.3.  However, urinalysis was acutely infected at that time with 40-60 WBC, bacteria, nitrite positive, 3+ leukocytes.  This was not sent for culture.  He was started on a 2-week course of Cipro at that time.  He denies any gross hematuria or abdominal pain.  He has never tried any medications for his urinary symptoms previously.  He has a family history of prostate cancer in his father, however his father passed away from lung cancer.  There is no prior cross-sectional imaging to evaluate prostate volume.  IPSS score today 21 indicating severe symptoms, with quality of life mostly dissatisfied.  PVR in clinic is elevated today at 453 mL.  Urinalysis today with 6-10 WBCs, 0 RBCs, no bacteria, no yeast, nitrite negative.   PMH: Past Medical History:  Diagnosis Date  . Family hx of colon cancer requiring screening colonoscopy 06/21/2016   Father at age 45  . History of CVA (cerebrovascular accident) 06/22/2011   Overview:  right sided stroke in 2013 (basal ganglia, right insular region, and white matter of the left frontal lobe   . Hyperlipidemia   . Hypertension     Surgical History: Past Surgical History:  Procedure Laterality Date  . TONSILLECTOMY  1958    Family History: Family History  Problem Relation Age of Onset  . Arthritis Mother   . Kidney disease Mother   . Heart disease Father   . Cancer  Father        lung, colon  . Diabetes Brother   . Cancer Brother        stomach tumor  . Glaucoma Maternal Grandmother   . Kidney disease Paternal Grandmother   . Neuropathy Brother     Social History:  reports that he has never smoked. He has never used smokeless tobacco. He reports that he does not drink alcohol or use drugs.  Physical Exam: BP 129/80   Pulse 74   Ht 5\' 8"  (1.727 m)   Wt 159 lb (72.1 kg)   BMI 24.18 kg/m    Constitutional:  Alert and oriented, No acute distress. Cardiovascular: No clubbing, cyanosis, or edema. Respiratory: Normal respiratory effort, no increased work of breathing. GI: Abdomen is soft, nontender, nondistended, no abdominal masses GU: Circumcised phallus without lesions, widely patent meatus DRE: 50 cc, no nodules or masses, nontender Lymph: No cervical or inguinal lymphadenopathy.   Laboratory Data: Reviewed, see HPI  Assessment & Plan:   In summary, he is a 69 year old male with longstanding urinary symptoms of weak stream, incomplete emptying, significantly elevated PVR 450 mL in clinic today, recurrent UTIs, history of urinary retention requiring Foley catheter, and single isolated elevated PSA of 8.3 at the time of an acute UTI last week.  We had a long conversation about BPH and incomplete bladder emptying as the likely etiology of his urinary symptoms, elevated PVR, recurrent UTIs, as well as the likely relationship of acute UTI  causing an elevation in his PSA last week.  I recommended further evaluation of his BPH with a transrectal ultrasound to evaluate prostate volume as well as a cystoscopy to see if he is a candidate for an outlet procedure.  We discussed the risks and benefits of HoLEP at length.  The procedure requires general anesthesia and takes 2 to 3 hours, and a holmium laser is used to enucleate the prostate and push this tissue into the bladder.  A morcellator is then used to remove this tissue, which is sent for pathology.   The vast majority of patients are able to discharge the same day with a catheter in place for 2 to 3 days, and will follow-up in clinic for a voiding trial.  Approximately 5% of patients will be admitted overnight to monitor the urine, or if they have multiple co-morbidities.  We specifically discussed the risks of bleeding, infection, retrograde ejaculation, temporary urgency and urge incontinence, very low risk of long-term incontinence, pathologic evaluation of prostate tissue and possible detection of prostate cancer or other malignancy, and possible need for additional procedures.  We discussed the low, but not 0, risk of discovering prostate cancer on HoLEP, however the most likely etiology of his elevated PSA is an acute UTI.  RTC for transrectal ultrasound and cystoscopy for HoLEP planning Continue Cipro for 2-week course total for acute UTI  I spent 45 total minutes on the day of the encounter including pre-visit review of the medical record, face-to-face time with the patient, and post visit ordering of labs/imaging/tests.  Nickolas Madrid, MD 04/08/2019  [facility] [address] [place], [place] [address] [phone]

## 2019-04-08 NOTE — H&P (View-Only) (Signed)
04/08/19 1:56 PM   Xavier [name] 1950-08-28 [id number]  CC: BPH and urinary symptoms, elevated PSA, recurrent UTIs  HPI: I saw Mr. Holms in urology clinic for evaluation of elevated PSA as well as BPH and urinary symptoms.  He reports a long multiyear history of difficulty urinating with weak stream, intermittency, and incontinence overnight.  He also reports multiple UTIs, as well as an episode of urinary retention approximately 10 years ago that required catheter placement at [facility].  His baseline PSA is 3.3, however he recently had his PSA checked and it was elevated at 8.3.  However, urinalysis was acutely infected at that time with 40-60 WBC, bacteria, nitrite positive, 3+ leukocytes.  This was not sent for culture.  He was started on a 2-week course of Cipro at that time.  He denies any gross hematuria or abdominal pain.  He has never tried any medications for his urinary symptoms previously.  He has a family history of prostate cancer in his father, however his father passed away from lung cancer.  There is no prior cross-sectional imaging to evaluate prostate volume.  IPSS score today 21 indicating severe symptoms, with quality of life mostly dissatisfied.  PVR in clinic is elevated today at 453 mL.  Urinalysis today with 6-10 WBCs, 0 RBCs, no bacteria, no yeast, nitrite negative.   PMH: Past Medical History:  Diagnosis Date  . Family hx of colon cancer requiring screening colonoscopy 06/21/2016   Father at age 64  . History of CVA (cerebrovascular accident) 06/22/2011   Overview:  right sided stroke in 2013 (basal ganglia, right insular region, and white matter of the left frontal lobe   . Hyperlipidemia   . Hypertension     Surgical History: Past Surgical History:  Procedure Laterality Date  . TONSILLECTOMY  1958    Family History: Family History  Problem Relation Age of Onset  . Arthritis Mother   . Kidney disease Mother   . Heart disease Father   . Cancer  Father        lung, colon  . Diabetes Brother   . Cancer Brother        stomach tumor  . Glaucoma Maternal Grandmother   . Kidney disease Paternal Grandmother   . Neuropathy Brother     Social History:  reports that he has never smoked. He has never used smokeless tobacco. He reports that he does not drink alcohol or use drugs.  Physical Exam: BP 129/80   Pulse 74   Ht 5\' 8"  (1.727 m)   Wt 159 lb (72.1 kg)   BMI 24.18 kg/m    Constitutional:  Alert and oriented, No acute distress. Cardiovascular: No clubbing, cyanosis, or edema. Respiratory: Normal respiratory effort, no increased work of breathing. GI: Abdomen is soft, nontender, nondistended, no abdominal masses GU: Circumcised phallus without lesions, widely patent meatus DRE: 50 cc, no nodules or masses, nontender Lymph: No cervical or inguinal lymphadenopathy.   Laboratory Data: Reviewed, see HPI  Assessment & Plan:   In summary, he is a 69 year old male with longstanding urinary symptoms of weak stream, incomplete emptying, significantly elevated PVR 450 mL in clinic today, recurrent UTIs, history of urinary retention requiring Foley catheter, and single isolated elevated PSA of 8.3 at the time of an acute UTI last week.  We had a long conversation about BPH and incomplete bladder emptying as the likely etiology of his urinary symptoms, elevated PVR, recurrent UTIs, as well as the likely relationship of acute UTI  causing an elevation in his PSA last week.  I recommended further evaluation of his BPH with a transrectal ultrasound to evaluate prostate volume as well as a cystoscopy to see if he is a candidate for an outlet procedure.  We discussed the risks and benefits of HoLEP at length.  The procedure requires general anesthesia and takes 2 to 3 hours, and a holmium laser is used to enucleate the prostate and push this tissue into the bladder.  A morcellator is then used to remove this tissue, which is sent for pathology.   The vast majority of patients are able to discharge the same day with a catheter in place for 2 to 3 days, and will follow-up in clinic for a voiding trial.  Approximately 5% of patients will be admitted overnight to monitor the urine, or if they have multiple co-morbidities.  We specifically discussed the risks of bleeding, infection, retrograde ejaculation, temporary urgency and urge incontinence, very low risk of long-term incontinence, pathologic evaluation of prostate tissue and possible detection of prostate cancer or other malignancy, and possible need for additional procedures.  We discussed the low, but not 0, risk of discovering prostate cancer on HoLEP, however the most likely etiology of his elevated PSA is an acute UTI.  RTC for transrectal ultrasound and cystoscopy for HoLEP planning Continue Cipro for 2-week course total for acute UTI  I spent 45 total minutes on the day of the encounter including pre-visit review of the medical record, face-to-face time with the patient, and post visit ordering of labs/imaging/tests.  Nickolas Madrid, MD 04/08/2019  [facility] [address] [place], [place] [address] [phone]

## 2019-04-08 NOTE — Patient Instructions (Addendum)
1. Take 2 weeks of the cipro antibiotic for your urinary tract infection  Benign Prostatic Hyperplasia  Benign prostatic hyperplasia (BPH) is an enlarged prostate gland that is caused by the normal aging process and not by cancer. The prostate is a walnut-sized gland that is involved in the production of semen. It is located in front of the rectum and below the bladder. The bladder stores urine and the urethra is the tube that carries the urine out of the body. The prostate may get bigger as a man gets older. An enlarged prostate can press on the urethra. This can make it harder to pass urine. The build-up of urine in the bladder can cause infection. Back pressure and infection may progress to bladder damage and kidney (renal) failure. What are the causes? This condition is part of a normal aging process. However, not all men develop problems from this condition. If the prostate enlarges away from the urethra, urine flow will not be blocked. If it enlarges toward the urethra and compresses it, there will be problems passing urine. What increases the risk? This condition is more likely to develop in men over the age of 82 years. What are the signs or symptoms? Symptoms of this condition include:  Getting up often during the night to urinate.  Needing to urinate frequently during the day.  Difficulty starting urine flow.  Decrease in size and strength of your urine stream.  Leaking (dribbling) after urinating.  Inability to pass urine. This needs immediate treatment.  Inability to completely empty your bladder.  Pain when you pass urine. This is more common if there is also an infection.  Urinary tract infection (UTI). How is this diagnosed? This condition is diagnosed based on your medical history, a physical exam, and your symptoms. Tests will also be done, such as:  A post-void bladder scan. This measures any amount of urine that may remain in your bladder after you finish  urinating.  A digital rectal exam. In a rectal exam, your health care provider checks your prostate by putting a lubricated, gloved finger into your rectum to feel the back of your prostate gland. This exam detects the size of your gland and any abnormal lumps or growths.  An exam of your urine (urinalysis).  A prostate specific antigen (PSA) screening. This is a blood test used to screen for prostate cancer.  An ultrasound. This test uses sound waves to electronically produce a picture of your prostate gland. Your health care provider may refer you to a specialist in kidney and prostate diseases (urologist). How is this treated? Once symptoms begin, your health care provider will monitor your condition (active surveillance or watchful waiting). Treatment for this condition will depend on the severity of your condition. Treatment may include:  Observation and yearly exams. This may be the only treatment needed if your condition and symptoms are mild.  Medicines to relieve your symptoms, including: ? Medicines to shrink the prostate. ? Medicines to relax the muscle of the prostate.  Surgery in severe cases. Surgery may include: ? Prostatectomy. In this procedure, the prostate tissue is removed completely through an open incision or with a laparoscope or robotics. ? Transurethral resection of the prostate (TURP). In this procedure, a tool is inserted through the opening at the tip of the penis (urethra). It is used to cut away tissue of the inner core of the prostate. The pieces are removed through the same opening of the penis. This removes the blockage. ? Transurethral  incision (TUIP). In this procedure, small cuts are made in the prostate. This lessens the prostate's pressure on the urethra. ? Transurethral microwave thermotherapy (TUMT). This procedure uses microwaves to create heat. The heat destroys and removes a small amount of prostate tissue. ? Transurethral needle ablation (TUNA).  This procedure uses radio frequencies to destroy and remove a small amount of prostate tissue. ? Interstitial laser coagulation ([place]). This procedure uses a laser to destroy and remove a small amount of prostate tissue. ? Transurethral electrovaporization (TUVP). This procedure uses electrodes to destroy and remove a small amount of prostate tissue. ? Prostatic urethral lift. This procedure inserts an implant to push the lobes of the prostate away from the urethra. Follow these instructions at home:  Take over-the-counter and prescription medicines only as told by your health care provider.  Monitor your symptoms for any changes. Contact your health care provider with any changes.  Avoid drinking large amounts of liquid before going to bed or out in public.  Avoid or reduce how much caffeine or alcohol you drink.  Give yourself time when you urinate.  Keep all follow-up visits as told by your health care provider. This is important. Contact a health care provider if:  You have unexplained back pain.  Your symptoms do not get better with treatment.  You develop side effects from the medicine you are taking.  Your urine becomes very dark or has a bad smell.  Your lower abdomen becomes distended and you have trouble passing your urine. Get help right away if:  You have a fever or chills.  You suddenly cannot urinate.  You feel lightheaded, or very dizzy, or you faint.  There are large amounts of blood or clots in the urine.  Your urinary problems become hard to manage.  You develop moderate to severe low back or flank pain. The flank is the side of your body between the ribs and the hip. These symptoms may represent a serious problem that is an emergency. Do not wait to see if the symptoms will go away. Get medical help right away. Call your local emergency services (911 in the [place] Do not drive yourself to the hospital. Summary  Benign prostatic hyperplasia (BPH) is an  enlarged prostate that is caused by the normal aging process and not by cancer.  An enlarged prostate can press on the urethra. This can make it hard to pass urine.  This condition is part of a normal aging process and is more likely to develop in men over the age of 7 years.  Get help right away if you suddenly cannot urinate. This information is not intended to replace advice given to you by your health care provider. Make sure you discuss any questions you have with your health care provider. Document Revised: 11/21/2017 Document Reviewed: 02/01/2016 Elsevier Patient Education  [place].  Holmium Laser Enucleation of the Prostate (HoLEP)  HoLEP is a treatment for men with benign prostatic hyperplasia (BPH). The laser surgery removed blockages of urine flow, and is done without any incisions on the body.     What is HoLEP?  HoLEP is a type of laser surgery used to treat obstruction (blockage) of urine flow as a result of benign prostatic hyperplasia (BPH). In men with BPH, the prostate gland is not cancerous, but has become enlarged. An enlarged prostate can result in a number of urinary tract symptoms such as weak urinary stream, difficulty in starting urination, inability to urinate, frequent urination, or  getting up at night to urinate.  HoLEP was developed in the 1990's as a more effective and less expensive surgical option for BPH, compared to other surgical options such as laser vaporization(PVP/greenlight laser), transurethral resection of the prostate(TURP), and open simple prostatectomy.   What happens during a HoLEP?  HoLEP requires general anesthesia ("asleep" throughout the procedure).   An antibiotic is given to reduce the risk of infection  A surgical instrument called a resectoscope is inserted through the urethra (the tube that carries urine from the bladder). The resectoscope has a camera that allows the surgeon to view the internal structure of the prostate  gland, and to see where the incisions are being made during surgery.  The laser is inserted into the resectoscope and is used to enucleate (free up) the enlarged prostate tissue from the capsule (outer shell) and then to seal up any blood vessels. The tissue that has been removed is pushed back into the bladder.  A morcellator is placed through the resectoscope, and is used to suction out the prostate tissue that has been pushed into the bladder.  When the prostate tissue has been removed, the resectoscope is removed, and a foley catheter is placed to allow healing and drain the urine from the bladder.     What happens after a HoLEP?  More than 90% of patients go home the same day a few hours after surgery. Less than 10% will be admitted to the hospital overnight for observation to monitor the urine, or if they have other medical problems.  Fluid is flushed through the catheter for about 1 hour after surgery to clear any blood from the urine. It is normal to have some blood in the urine after surgery. The need for blood transfusion is extremely rare.  Eating and drinking are permitted after the procedure once the patient has fully awakened from anesthesia.  The catheter is usually removed 2-3 days after surgery- the patient will come to clinic to have the catheter removed and make sure they can urinate on their own.  It is very important to drink lots of fluids after surgery for one week to keep the bladder flushed.  At first, there may be some burning with urination, but this typically improved within a few hours to days. Most patients do not have a significant amount of pain, and narcotic pain medications are rarely needed.  Symptoms of urinary frequency, urgency, and even leakage are NORMAL for the first few weeks after surgery as the bladder adjusts after having to work hard against blockage from the prostate for many years. This will improve, but can sometimes take several months.  The  use of pelvic floor exercises (Kegel exercises) can help improve problems with urinary incontinence.   After catheter removal, patients will be seen at 6 weeks and 6 months for symptom check  No heavy lifting for at least 2-3 weeks after surgery, however patients can walk and do light activities the first day after surgery. Return to work time depends on occupation.    What are the advantages of HoLEP?  HoLEP has been studied in many different parts of the world and has been shown to be a safe and effective procedure. Although there are many types of BPH surgeries available, HoLEP offers a unique advantage in being able to remove a large amount of tissue without any incisions on the body, even in very large prostates, while decreasing the risk of bleeding and providing tissue for pathology (to look for  cancer). This decreases the need for blood transfusions during surgery, minimizes hospital stay, and reduces the risk of needing repeat treatment.  What are the side effects of HoLEP?  Temporary burning and bleeding during urination. Some blood may be seen in the urine for weeks after surgery and is part of the healing process.  Urinary incontinence (inability to control urine flow) is expected in all patients immediately after surgery and they should wear pads for the first few days/weeks. This typically improves over the course of several weeks. Performing Kegel exercises can help decrease leakage from stress maneuvers such as coughing, sneezing, or lifting. The rate of long term leakage is very low. Patients may also have leakage with urgency and this may be treated with medication. The risk of urge incontinence can be dependent on several factors including age, prostate size, symptoms, and other medical problems.  Retrograde ejaculation or "backwards ejaculation." In 75% of cases, the patient will not see any fluid during ejaculation after surgery.  Erectile function is generally not  significantly affected.   What are the risks of HoLEP?  Injury to the urethra or development of scar tissue at a later date  Injury to the capsule of the prostate (typically treated with longer catheterization).  Injury to the bladder or ureteral orifices (where the urine from the kidney drains out)  Infection of the bladder, testes, or kidneys  Return of urinary obstruction at a later date requiring another operation (<2%)  Need for blood transfusion or re-operation due to bleeding  Failure to relieve all symptoms and/or need for prolonged catheterization after surgery  5-15% of patients are found to have previously undiagnosed prostate cancer in their specimen. Prostate cancer can be treated after HoLEP.  Standard risks of anesthesia including blood clots, heart attacks, etc  When should I call my doctor?  Fever over 101.3 degrees  Inability to urinate, or large blood clots in the urine    Prostate Cancer Screening  Prostate cancer screening is a test that is done to check for the presence of prostate cancer in men. The prostate gland is a walnut-sized gland that is located below the bladder and in front of the rectum in males. The function of the prostate is to add fluid to semen during ejaculation. Prostate cancer is the second most common type of cancer in men. Who should have prostate cancer screening?  Screening recommendations vary based on age and other risk factors. Screening is recommended if:  You are older than age 46. If you are age 55-69, talk with your health care provider about your need for screening and how often screening should be done. Because most prostate cancers are slow growing and will not cause death, screening is generally reserved in this age group for men who have a 10-15-year life expectancy.  You are younger than age 25, and you have these risk factors: ? Being a black male or a male of African descent. ? Having a father, brother, or uncle who  has been diagnosed with prostate cancer. The risk is higher if your family member's cancer occurred at an early age. Screening is not recommended if:  You are younger than age 40.  You are between the ages of 83 and 29 and you have no risk factors.  You are 44 years of age or older. At this age, the risks that screening can cause are greater than the benefits that it may provide. If you are at high risk for prostate cancer, your health  care provider may recommend that you have screenings more often or that you start screening at a younger age. How is screening for prostate cancer done? The recommended prostate cancer screening test is a blood test called the prostate-specific antigen (PSA) test. PSA is a protein that is made in the prostate. As you age, your prostate naturally produces more PSA. Abnormally high PSA levels may be caused by:  Prostate cancer.  An enlarged prostate that is not caused by cancer (benign prostatic hyperplasia, BPH). This condition is very common in older men.  A prostate gland infection (prostatitis). Depending on the PSA results, you may need more tests, such as:  A physical exam to check the size of your prostate gland.  Blood and imaging tests.  A procedure to remove tissue samples from your prostate gland for testing (biopsy). What are the benefits of prostate cancer screening?  Screening can help to identify cancer at an early stage, before symptoms start and when the cancer can be treated more easily.  There is a small chance that screening may lower your risk of dying from prostate cancer. The chance is small because prostate cancer is a slow-growing cancer, and most men with prostate cancer die from a different cause. What are the risks of prostate cancer screening? The main risk of prostate cancer screening is diagnosing and treating prostate cancer that would never have caused any symptoms or problems. This is called overdiagnosisand overtreatment.  PSA screening cannot tell you if your PSA is high due to cancer or a different cause. A prostate biopsy is the only procedure to diagnose prostate cancer. Even the results of a biopsy may not tell you if your cancer needs to be treated. Slow-growing prostate cancer may not need any treatment other than monitoring, so diagnosing and treating it may cause unnecessary stress or other side effects. A prostate biopsy may also cause:  Infection or fever.  A false negative. This is a result that shows that you do not have prostate cancer when you actually do have prostate cancer. Questions to ask your health care provider  When should I start prostate cancer screening?  What is my risk for prostate cancer?  How often do I need screening?  What type of screening tests do I need?  How do I get my test results?  What do my results mean?  Do I need treatment? Where to find more information  The American Cancer Society: www.cancer.org  American Urological Association: www.auanet.org Contact a health care provider if:  You have difficulty urinating.  You have pain when you urinate or ejaculate.  You have blood in your urine or semen.  You have pain in your back or in the area of your prostate. Summary  Prostate cancer is a common type of cancer in men. The prostate gland is located below the bladder and in front of the rectum. This gland adds fluid to semen during ejaculation.  Prostate cancer screening may identify cancer at an early stage, when the cancer can be treated more easily.  The prostate-specific antigen (PSA) test is the recommended screening test for prostate cancer.  Discuss the risks and benefits of prostate cancer screening with your health care provider. If you are age 16 or older, the risks that screening can cause are greater than the benefits that it may provide. This information is not intended to replace advice given to you by your health care provider. Make sure  you discuss any questions you have with your  health care provider. Document Revised: 08/09/2018 Document Reviewed: 08/09/2018 Elsevier Patient Education  [place].

## 2019-04-09 ENCOUNTER — Inpatient Hospital Stay: Admission: RE | Admit: 2019-04-09 | Payer: Self-pay | Source: Ambulatory Visit

## 2019-04-10 ENCOUNTER — Inpatient Hospital Stay: Admission: RE | Admit: 2019-04-10 | Payer: Self-pay | Source: Ambulatory Visit

## 2019-04-11 ENCOUNTER — Telehealth: Payer: Self-pay

## 2019-04-11 ENCOUNTER — Inpatient Hospital Stay
Admission: RE | Admit: 2019-04-11 | Discharge: 2019-04-11 | Disposition: A | Discharge status: Home / Self Care | Payer: Self-pay | Source: Ambulatory Visit

## 2019-04-11 ENCOUNTER — Other Ambulatory Visit: Payer: Self-pay

## 2019-04-11 ENCOUNTER — Ambulatory Visit (INDEPENDENT_AMBULATORY_CARE_PROVIDER_SITE_OTHER): Payer: Medicare HMO | Admitting: Urology

## 2019-04-11 ENCOUNTER — Encounter: Payer: Self-pay | Admitting: Urology

## 2019-04-11 ENCOUNTER — Other Ambulatory Visit: Payer: Self-pay | Admitting: Radiology

## 2019-04-11 VITALS — BP 134/85 | HR 96 | Ht 64.0 in | Wt 156.0 lb

## 2019-04-11 DIAGNOSIS — N401 Enlarged prostate with lower urinary tract symptoms: Secondary | ICD-10-CM | POA: Diagnosis not present

## 2019-04-11 DIAGNOSIS — N35912 Unspecified bulbous urethral stricture, male: Secondary | ICD-10-CM | POA: Diagnosis not present

## 2019-04-11 DIAGNOSIS — N138 Other obstructive and reflux uropathy: Secondary | ICD-10-CM | POA: Diagnosis not present

## 2019-04-11 NOTE — Telephone Encounter (Signed)
Pre admit testing called and has been trying to get in touch with the patient. They have called the wife's cell number and have left a message and today when they call the voice mailbox is full. They are unable to leave a message on the patient's cell as the voice mail is not set up. I attempted to call both numbers and no answer at either and unable to leave a message. If they cannot get in touch with the patient as he needs pre surgery work up he may have to reschedule his surgery.

## 2019-04-11 NOTE — Pre-Procedure Instructions (Signed)
UNABLE TO REACH PT 3RD DAY IN ROW. VM NOW FULL NOT ABLE TO LEAVE MSG AS DONE ON 3/30 AND 3/31. OFFICE NOTIFIED.

## 2019-04-11 NOTE — Progress Notes (Signed)
Cystoscopy Procedure Note:  Indication: Elevated PVRs greater than 400 mL, difficulty urinating, recurrent UTIs  After informed consent and discussion of the procedure and its risks, Xavier [name] was positioned and prepped in the standard fashion. Cystoscopy was performed with a flexible cystoscope.  There was a pinpoint bulbourethral stricture that appeared short in length.  Transrectal ultrasound procedure note: Digital rectal exam demonstrated a small prostate with no nodules or masses. The transrectal ultrasound was inserted into the rectum and demonstrated a 17 cc prostate with no hypoechoic areas or other abnormalities.  The bladder was dilated.  ----------------------------------------------------------------------------------------------------   Assessment and Plan: We discussed findings of a pinpoint bulbar urethral stricture today in clinic, as well as treatment options including cystoscopy with balloon dilation and direct vision internal urethrotomy versus urethroplasty.  We discussed first-line is typically balloon dilation, but there can be a high recurrence rate and we need to follow him closely to detect possible recurrence.  We discussed the risks of bleeding, infection, and recurrence at length.  This procedure takes about 10 to 15 minutes and is done under general anesthesia.  He is having a umbilical hernia repair with Dr. Hampton Abbot on Wednesday 4/7, and we will coordinate to have these procedures done at the same time.  I discussed his case with Dr. Hampton Abbot, as well as my partner Dr. Bernardo Heater who will be performing the procedure next week.  -Schedule cystoscopy/balloon dilation of urethral stricture/DVIU next week to coordinate with general surgery repair of umbilical hernia -Will need follow-up PSA to confirm decreased after mild elevation during acute UTI  I spent 20 total minutes on the day of the encounter including pre-visit review of the medical record, face-to-face  time with the patient, and post visit ordering of labs/imaging/tests.   Nickolas Madrid, MD 04/11/2019

## 2019-04-12 LAB — MICROSCOPIC EXAMINATION
Bacteria, UA: NONE SEEN
RBC, Urine: NONE SEEN /hpf (ref 0–2)

## 2019-04-12 LAB — URINALYSIS, COMPLETE
Bilirubin, UA: NEGATIVE
Glucose, UA: NEGATIVE
Ketones, UA: NEGATIVE
Leukocytes,UA: NEGATIVE
Nitrite, UA: NEGATIVE
Protein,UA: NEGATIVE
RBC, UA: NEGATIVE
Specific Gravity, UA: 1.02 (ref 1.005–1.030)
Urobilinogen, Ur: 0.2 mg/dL (ref 0.2–1.0)
pH, UA: 6.5 (ref 5.0–7.5)

## 2019-04-14 LAB — CULTURE, URINE COMPREHENSIVE

## 2019-04-15 ENCOUNTER — Encounter
Admission: RE | Admit: 2019-04-15 | Discharge: 2019-04-15 | Disposition: A | Discharge status: Home / Self Care | Payer: Medicare HMO | Source: Ambulatory Visit | Attending: Surgery | Admitting: Surgery

## 2019-04-15 ENCOUNTER — Other Ambulatory Visit: Payer: Self-pay

## 2019-04-15 ENCOUNTER — Other Ambulatory Visit: Payer: Self-pay | Admitting: Radiology

## 2019-04-15 DIAGNOSIS — Z01818 Encounter for other preprocedural examination: Secondary | ICD-10-CM | POA: Diagnosis not present

## 2019-04-15 DIAGNOSIS — Z20822 Contact with and (suspected) exposure to covid-19: Secondary | ICD-10-CM | POA: Insufficient documentation

## 2019-04-15 NOTE — Pre-Procedure Instructions (Signed)
REGARDING MEDICAL CLEARANCE:  Medical clearance initiated by Dr. Mont Dutton office on 04/01/19:   Celene Kras, CMA  Medical Clearance Reason for call  Conversation: Medical Clearance (Newest Message First) Celene Kras, [facility]     04/01/19 2:42 PM Note   Medical Clearance faxed to Raelyn Ensign [phone].      I called Dr. Mont Dutton office to see if they have received the clearance from the patient's PCP.  They have not received it. A nurse from that office is calling Raquel Sarna Boyce's office to inquire.

## 2019-04-15 NOTE — Patient Instructions (Signed)
Your procedure is scheduled on: Wednesday 04/17/19.  Report to DAY SURGERY DEPARTMENT LOCATED ON 2ND FLOOR MEDICAL MALL ENTRANCE. To find out your arrival time please call [phone] between 1PM - 3PM on Tuesday 04/16/19.   Remember: Instructions that are not followed completely may result in serious medical risk, up to and including death, or upon the discretion of your surgeon and anesthesiologist your surgery may need to be rescheduled.      _X__ 1. Do not eat food after midnight the night before your procedure.                 No gum chewing or hard candies. You may drink clear liquids up to 2 hours                 before you are scheduled to arrive for your surgery- DO NOT drink clear                 liquids within 2 hours of the start of your surgery.                 Clear Liquids include:  water, apple juice without pulp, clear carbohydrate                 drink such as Clearfast or Gatorade, Black Coffee or Tea (Do not add                 anything to coffee or tea).    __X__2.  On the morning of surgery brush your teeth with toothpaste and water, you may rinse your mouth with mouthwash if you wish.  Do not swallow any toothpaste or mouthwash.       _X__ 3.  No Alcohol for 24 hours before or after surgery.      _X__ 4.  Do Not Smoke or use e-cigarettes For 24 Hours Prior to Your Surgery.                 Do not use any chewable tobacco products for at least 6 hours prior to                 surgery.     __X__5.  Notify your doctor if there is any change in your medical condition      (cold, fever, infections).       Do not wear jewelry, make-up, hairpins, clips or nail polish. Do not wear lotions, powders, or perfumes.  Do not shave 48 hours prior to surgery. Men may shave face and neck. Do not bring valuables to the hospital.      [facility] is not responsible for any belongings or valuables.    Contacts, dentures/partials or body piercings may not be worn  into surgery. Bring a case for your contacts, glasses or hearing aids, a denture cup will be supplied. Leave your suitcase in the car. After surgery it may be brought to your room. For patients admitted to the hospital, discharge time is determined by your treatment team.    Patients discharged the day of surgery will not be allowed to drive home.     __X__ Take these medicines the morning of surgery with A SIP OF WATER:     1. amLODipine (NORVASC)       __X__ Use CHG Soap as directed.    __X__ Stop Blood Thinners: Aspirin TODAY. Dr. Hampton Abbot had directed you to stop Aspirin on 04/13/19. But said if you do not take any  today or any day prior to your surgery you should be fine to proceed.     __X__ Stop Anti-inflammatories 7 days before surgery such as Advil, Ibuprofen, Motrin, BC or Goodies Powder, Naprosyn, Naproxen, Aleve, Aspirin, Meloxicam. May take Tylenol if needed for pain or discomfort.     __X__ Do not start any new herbal supplements or vitamins prior to your surgery.

## 2019-04-16 ENCOUNTER — Encounter: Payer: Self-pay | Admitting: Family Medicine

## 2019-04-16 ENCOUNTER — Ambulatory Visit (INDEPENDENT_AMBULATORY_CARE_PROVIDER_SITE_OTHER): Payer: Medicare HMO | Admitting: Family Medicine

## 2019-04-16 VITALS — BP 130/90 | HR 102 | Temp 97.7°F | Resp 16 | Ht 63.5 in | Wt 159.0 lb

## 2019-04-16 DIAGNOSIS — Z01818 Encounter for other preprocedural examination: Secondary | ICD-10-CM | POA: Diagnosis not present

## 2019-04-16 DIAGNOSIS — D638 Anemia in other chronic diseases classified elsewhere: Secondary | ICD-10-CM

## 2019-04-16 DIAGNOSIS — I10 Essential (primary) hypertension: Secondary | ICD-10-CM | POA: Diagnosis not present

## 2019-04-16 DIAGNOSIS — I69354 Hemiplegia and hemiparesis following cerebral infarction affecting left non-dominant side: Secondary | ICD-10-CM

## 2019-04-16 DIAGNOSIS — K429 Umbilical hernia without obstruction or gangrene: Secondary | ICD-10-CM

## 2019-04-16 LAB — SARS CORONAVIRUS 2 (TAT 6-24 HRS): SARS Coronavirus 2: NEGATIVE

## 2019-04-16 NOTE — Progress Notes (Signed)
Name: Xavier [name]   MRN: [id number]    DOB: [birth year]   Date:04/16/2019       Progress Note  Subjective  Chief Complaint  Chief Complaint  Patient presents with  . Surgical clearance    HPI  HTN: he was seen by Dr. Latanya Maudlin March 2021 virtually and when he came in for bp check it was very high. He was given a refill of Hyzaar and also added norvasc 5 mg. He states when he was seen last time he was out of his Losartan hctz. He states for the past few days he has not taken Norvasc of Losartan hctz because he though he was supposed to stop all bp medication and aspirin prior to surgery Explained he needs to stay off aspirin until surgery, take losartan hctz when he gest home today .   Pre-op: he has large umbilical hernia and is scheduled for surgery in the morning. Dr. Hampton Abbot requested a pre-op clearance since he has a history of CVA back in 2013. He states he developed left side weakness, and is still slightly weak on left upper extremity . He was advised to resume statin therapy ( he states he does not have rx at home) He denies any recurrence of symptoms. Aspirin has been held for surgery. He is states he used to use left hand to throw and open jars but now the right is the stronger side. He is able to walk fast without sob, no cough, no chest pain or recent episodes of palpitation . He had [facility] EKG done at [facility] for pre-op. He has no history of complications from surgery in the past. No family history of reaction to anesthesia. He has dentures   Anemia of chronic disease: stable, needs to be monitored after surgery to make sure no significant drop   Patient Active Problem List   Diagnosis Date Noted  . Prediabetes 03/29/2019  . Lower urinary tract symptoms (LUTS) 02/22/2019  . Impingement syndrome of shoulder, left 09/28/2017  . Hearing loss 02/16/2017  . Umbilical hernia without obstruction and without gangrene 06/21/2016  . Medication monitoring encounter 02/23/2016  .  Hyperglycemia 02/04/2016  . Left carotid bruit 02/03/2016  . Arthritis of both hands 03/10/2015  . Spider varicose veins 03/10/2015  . Low serum thyroid stimulating hormone (TSH) 07/22/2014  . Hyperlipidemia LDL goal <70 07/21/2014  . Hypertension goal BP (blood pressure) < 140/90 07/21/2014  . Peripheral arterial disease ([place]) 07/21/2014  . History of CVA (cerebrovascular accident) 06/22/2011    Past Surgical History:  Procedure Laterality Date  . TONSILLECTOMY  1958  . TONSILLECTOMY      Family History  Problem Relation Age of Onset  . Arthritis Mother   . Kidney disease Mother   . Heart disease Father   . Cancer Father        lung, colon  . Diabetes Brother   . Cancer Brother        stomach tumor  . Glaucoma Maternal Grandmother   . Kidney disease Paternal Grandmother   . Neuropathy Brother     Social History   Tobacco Use  . Smoking status: Never Smoker  . Smokeless tobacco: Never Used  Substance Use Topics  . Alcohol use: Yes    Alcohol/week: 0.0 standard drinks    Comment: occasional     Current Outpatient Medications:  .  aspirin EC 81 MG tablet, Take 1 tablet (81 mg total) by mouth daily., Disp: , Rfl:  .  losartan-hydrochlorothiazide (HYZAAR)  100-25 MG tablet, Take 1 tablet by mouth daily., Disp: 90 tablet, Rfl: 3 .  pravastatin (PRAVACHOL) 40 MG tablet, Take 1 tablet (40 mg total) by mouth at bedtime., Disp: 90 tablet, Rfl: 3  No Known Allergies  I personally reviewed active problem list, medication list, allergies, family history, social history, health maintenance with the patient/caregiver today.   ROS  Constitutional: Negative for fever or weight change.  Respiratory: Negative for cough and shortness of breath.   Cardiovascular: Negative for chest pain or palpitations.  Gastrointestinal: Negative for abdominal pain, no bowel changes.  Musculoskeletal: Negative for gait problem or joint swelling.  Skin: Negative for rash.  Neurological:  Negative for dizziness or headache.  No other specific complaints in a complete review of systems (except as listed in HPI above).  Objective  Vitals:   04/16/19 1308  BP: 130/90  Pulse: (!) 102  Resp: 16  Temp: 97.7 F (36.5 C)  TempSrc: Temporal  SpO2: 97%  Weight: 159 lb (72.1 kg)  Height: 5' 3.5" (1.613 m)    Body mass index is 27.72 kg/m.  Physical Exam  Constitutional: Patient appears well-developed and well-nourished.  No distress.  HEENT: head atraumatic, normocephalic, pupils equal and reactive to light Cardiovascular: Normal rate, regular rhythm and normal heart sounds.  No murmur heard. No BLE edema. Pulmonary/Chest: Effort normal and breath sounds normal. No respiratory distress. Abdominal: Soft.  There is no tenderness.Umbilical hernia, reducible  Psychiatric: Patient has a normal mood and affect. behavior is normal. Judgment and thought content normal. Neurological: good grip bilaterally but weaker on left when compared to right side   Recent Results (from the past 2160 hour(s))  COMPLETE METABOLIC PANEL WITH GFR     Status: None   Collection Time: 03/29/19 10:06 AM  Result Value Ref Range   Glucose, Bld 95 65 - 99 mg/dL    Comment: .            Fasting reference interval .    BUN 14 7 - 25 mg/dL   Creat 1.23 0.70 - 1.25 mg/dL    Comment: For patients >33 years of age, the reference limit for Creatinine is approximately 13% higher for people identified as African-American. .    GFR, Est Non African American 60 > OR = 60 mL/min/1.34m2   GFR, Est African American 70 > OR = 60 mL/min/1.60m2   BUN/Creatinine Ratio NOT APPLICABLE 6 - 22 (calc)   Sodium 140 135 - 146 mmol/L   Potassium 5.0 3.5 - 5.3 mmol/L   Chloride 104 98 - 110 mmol/L   CO2 28 20 - 32 mmol/L   Calcium 9.5 8.6 - 10.3 mg/dL   Total Protein 6.8 6.1 - 8.1 g/dL   Albumin 4.3 3.6 - 5.1 g/dL   Globulin 2.5 1.9 - 3.7 g/dL (calc)   AG Ratio 1.7 1.0 - 2.5 (calc)   Total Bilirubin 0.5 0.2 -  1.2 mg/dL   Alkaline phosphatase (APISO) 67 35 - 144 U/L   AST 19 10 - 35 U/L   ALT 17 9 - 46 U/L  CBC with Differential/Platelet     Status: Abnormal   Collection Time: 03/29/19 10:06 AM  Result Value Ref Range   WBC 6.8 3.8 - 10.8 Thousand/uL   RBC 3.98 (L) 4.20 - 5.80 Million/uL   Hemoglobin 12.7 (L) 13.2 - 17.1 g/dL   HCT 37.7 (L) 38.5 - 50.0 %   MCV 94.7 80.0 - 100.0 fL   MCH 31.9 27.0 - 33.0  pg   MCHC 33.7 32.0 - 36.0 g/dL   RDW 11.8 11.0 - 15.0 %   Platelets 255 140 - 400 Thousand/uL   MPV 10.5 7.5 - 12.5 fL   Neutro Abs 4,311 1,500 - 7,800 cells/uL   Lymphs Abs 1,510 850 - 3,900 cells/uL   Absolute Monocytes 748 200 - 950 cells/uL   Eosinophils Absolute 211 15 - 500 cells/uL   Basophils Absolute 20 0 - 200 cells/uL   Neutrophils Relative % 63.4 %   Total Lymphocyte 22.2 %   Monocytes Relative 11.0 %   Eosinophils Relative 3.1 %   Basophils Relative 0.3 %  TSH     Status: None   Collection Time: 03/29/19 10:06 AM  Result Value Ref Range   TSH 1.03 0.40 - 4.50 mIU/L  Lipid panel     Status: None   Collection Time: 03/29/19 10:06 AM  Result Value Ref Range   Cholesterol 156 <200 mg/dL   HDL 65 > OR = 40 mg/dL   Triglycerides 49 <150 mg/dL   LDL Cholesterol (Calc) 77 mg/dL (calc)    Comment: Reference range: <100 . Desirable range <100 mg/dL for primary prevention;   <70 mg/dL for patients with CHD or diabetic patients  with > or = 2 CHD risk factors. Marland Kitchen LDL-C is now calculated using the Martin-Hopkins  calculation, which is a validated novel method providing  better accuracy than the Friedewald equation in the  estimation of LDL-C.  Cresenciano Genre et al. Annamaria Helling. [id number]): 2061-2068  (http://education.[facility].com/faq/FAQ164)    Total CHOL/HDL Ratio 2.4 <5.0 (calc)   Non-HDL Cholesterol (Calc) 91 <130 mg/dL (calc)    Comment: For patients with diabetes plus 1 major ASCVD risk  factor, treating to a non-HDL-C goal of <100 mg/dL  (LDL-C of <70 mg/dL) is  considered a therapeutic  option.   Hemoglobin A1c     Status: None   Collection Time: 03/29/19 10:06 AM  Result Value Ref Range   Hgb A1c MFr Bld 5.2 <5.7 % of total Hgb    Comment: For the purpose of screening for the presence of diabetes: . <5.7%       Consistent with the absence of diabetes 5.7-6.4%    Consistent with increased risk for diabetes             (prediabetes) > or =6.5%  Consistent with diabetes . This assay result is consistent with a decreased risk of diabetes. . Currently, no consensus exists regarding use of hemoglobin A1c for diagnosis of diabetes in children. . According to American Diabetes Association (ADA) guidelines, hemoglobin A1c <7.0% represents optimal control in non-pregnant diabetic patients. Different metrics may apply to specific patient populations.  Standards of Medical Care in Diabetes(ADA). .    Mean Plasma Glucose 103 (calc)   eAG (mmol/L) 5.7 (calc)  PSA     Status: Abnormal   Collection Time: 03/29/19 10:06 AM  Result Value Ref Range   PSA 8.3 (H) < OR = 4.0 ng/mL    Comment: The total PSA value from this assay system is  standardized against the WHO standard. The test  result will be approximately 20% lower when compared  to the equimolar-standardized total PSA (Beckman  Coulter). Comparison of serial PSA results should be  interpreted with this fact in mind. . This test was performed using the Siemens  chemiluminescent method. Values obtained from  different assay methods cannot be used interchangeably. PSA levels, regardless of value, should not be interpreted as absolute evidence  of the presence or absence of disease.   Urinalysis, Complete     Status: Abnormal   Collection Time: 03/29/19 10:06 AM  Result Value Ref Range   Color, Urine YELLOW YELLOW   APPearance CLOUDY (A) CLEAR   Specific Gravity, Urine 1.013 1.001 - 1.03   pH 6.5 5.0 - 8.0   Glucose, UA NEGATIVE NEGATIVE   Bilirubin Urine NEGATIVE NEGATIVE    Ketones, ur NEGATIVE NEGATIVE   Hgb urine dipstick NEGATIVE NEGATIVE   Protein, ur NEGATIVE NEGATIVE   Nitrite POSITIVE (A) NEGATIVE   Leukocytes,Ua 3+ (A) NEGATIVE   WBC, UA 40-60 (A) 0 - 5 /HPF   RBC / HPF NONE SEEN 0 - 2 /HPF   Squamous Epithelial / LPF NONE SEEN < OR = 5 /HPF   Bacteria, UA FEW (A) NONE SEEN /HPF   Hyaline Cast NONE SEEN NONE SEEN /LPF  Iron, TIBC and Ferritin Panel     Status: None   Collection Time: 03/29/19 10:06 AM  Result Value Ref Range   Iron 78 50 - 180 mcg/dL   TIBC 284 250 - 425 mcg/dL (calc)   %SAT 27 20 - 48 % (calc)   Ferritin 132 24 - 380 ng/mL  Urinalysis, Complete     Status: Abnormal   Collection Time: 04/03/19  3:37 PM  Result Value Ref Range   Color, Urine YELLOW YELLOW   APPearance CLEAR CLEAR   Specific Gravity, Urine 1.009 1.001 - 1.03   pH 7.0 5.0 - 8.0   Glucose, UA NEGATIVE NEGATIVE   Bilirubin Urine NEGATIVE NEGATIVE   Ketones, ur NEGATIVE NEGATIVE   Hgb urine dipstick NEGATIVE NEGATIVE   Protein, ur NEGATIVE NEGATIVE   Nitrite POSITIVE (A) NEGATIVE   Leukocytes,Ua 3+ (A) NEGATIVE   WBC, UA 6-10 (A) 0 - 5 /HPF   RBC / HPF NONE SEEN 0 - 2 /HPF   Squamous Epithelial / LPF 0-5 < OR = 5 /HPF   Bacteria, UA FEW (A) NONE SEEN /HPF   Hyaline Cast NONE SEEN NONE SEEN /LPF  Urine Culture     Status: None   Collection Time: 04/03/19  3:37 PM   Specimen: Urine  Result Value Ref Range   MICRO NUMBER: [id number]    SPECIMEN QUALITY: Adequate    Sample Source URINE    STATUS: FINAL    Result: No Growth   Urinalysis, Complete     Status: Abnormal   Collection Time: 04/08/19  1:15 PM  Result Value Ref Range   Specific Gravity, UA 1.020 1.005 - 1.030   pH, UA 7.0 5.0 - 7.5   Color, UA Yellow Yellow   Appearance Ur Clear Clear   Leukocytes,UA Trace (A) Negative   Protein,UA Negative Negative/Trace   Glucose, UA Negative Negative   Ketones, UA Negative Negative   RBC, UA Negative Negative   Bilirubin, UA Negative Negative    Urobilinogen, Ur 0.2 0.2 - 1.0 mg/dL   Nitrite, UA Negative Negative   Microscopic Examination See below:   Bladder Scan (Post Void Residual) in office     Status: None   Collection Time: 04/08/19  1:15 PM  Result Value Ref Range   Scan Result 453   Microscopic Examination     Status: Abnormal   Collection Time: 04/08/19  1:15 PM   URINE  Result Value Ref Range   WBC, UA 6-10 (A) 0 - 5 /hpf   RBC None seen 0 - 2 /hpf   Epithelial Cells (non  renal) 0-10 0 - 10 /hpf   Renal Epithel, UA 0-10 (A) None seen /hpf   Bacteria, UA None seen None seen/Few  CULTURE, URINE COMPREHENSIVE     Status: Abnormal   Collection Time: 04/08/19  2:05 PM   Specimen: Urine   UR  Result Value Ref Range   Urine Culture, Comprehensive Final report (A)    Organism ID, Bacteria Comment (A)     Comment: Staphylococcus haemolyticus 100 Colonies/mL                                                      . Based on resistance to oxacillin this isolate would be resistant to all currently available beta-lactam antimicrobial agents, with the exception of the newer cephalosporins with anti-MRSA activity, such as Ceftaroline    ANTIMICROBIAL SUSCEPTIBILITY Comment     Comment:       ** S = Susceptible; I = Intermediate; R = Resistant **                    P = Positive; N = Negative             MICS are expressed in micrograms per mL    Antibiotic                 RSLT#1    RSLT#2    RSLT#3    RSLT#4 Ciprofloxacin                  R Gentamicin                     S Levofloxacin                   R Linezolid                      S Nitrofurantoin                 S Oxacillin                      R Penicillin                     R Rifampin                       S Tetracycline                   S Trimethoprim/Sulfa             S Vancomycin                     S   Urinalysis, Complete     Status: None   Collection Time: 04/11/19  2:20 PM  Result Value Ref Range   Specific Gravity, UA 1.020 1.005 - 1.030   pH, UA  6.5 5.0 - 7.5   Color, UA Yellow Yellow   Appearance Ur Clear Clear   Leukocytes,UA Negative Negative   Protein,UA Negative Negative/Trace   Glucose, UA Negative Negative   Ketones, UA Negative Negative   RBC, UA Negative Negative   Bilirubin, UA Negative Negative   Urobilinogen, Ur 0.2 0.2 - 1.0 mg/dL   Nitrite, UA Negative Negative   Microscopic  Examination See below:   Microscopic Examination     Status: None   Collection Time: 04/11/19  2:20 PM   URINE  Result Value Ref Range   WBC, UA 0-5 0 - 5 /hpf   RBC None seen 0 - 2 /hpf   Epithelial Cells (non renal) 0-10 0 - 10 /hpf   Bacteria, UA None seen None seen/Few  SARS CORONAVIRUS 2 (TAT 6-24 HRS) Nasopharyngeal Nasopharyngeal Swab     Status: None   Collection Time: 04/15/19 12:05 PM   Specimen: Nasopharyngeal Swab  Result Value Ref Range   SARS Coronavirus 2 NEGATIVE NEGATIVE    Comment: (NOTE) SARS-CoV-2 target nucleic acids are NOT DETECTED. The SARS-CoV-2 RNA is generally detectable in upper and lower respiratory specimens during the acute phase of infection. Negative results do not preclude SARS-CoV-2 infection, do not rule out co-infections with other pathogens, and should not be used as the sole basis for treatment or other patient management decisions. Negative results must be combined with clinical observations, patient history, and epidemiological information. The expected result is Negative. Fact Sheet for Patients: SugarRoll.be Fact Sheet for Healthcare Providers: https://www.woods-mathews.com/ This test is not yet approved or cleared by the [place] FDA and  has been authorized for detection and/or diagnosis of SARS-CoV-2 by FDA under an Emergency Use Authorization (EUA). This EUA will remain  in effect (meaning this test can be used) for the duration of the COVID-19 declaration under Section 56 4(b)(1) of the Act, 21 U.S.C. section 360bbb-3(b)(1), unless the  authorization is terminated or revoked sooner. Performed at [facility], [place] [address], [place], [place] [address]       PHQ2/9: Depression screen [facility] 2/9 04/16/2019 03/29/2019 02/22/2019 09/19/2017 02/16/2017  Decreased Interest 0 0 0 0 0  Down, Depressed, Hopeless 0 1 1 1  0  PHQ - 2 Score 0 1 1 1  0  Altered sleeping 0 1 1 1  -  Tired, decreased energy 0 0 0 0 -  Change in appetite 0 0 0 0 -  Feeling bad or failure about yourself  0 0 1 0 -  Trouble concentrating 0 0 0 0 -  Moving slowly or fidgety/restless 0 0 0 0 -  Suicidal thoughts 0 0 0 0 -  PHQ-9 Score 0 2 3 2  -  Difficult doing work/chores - Not difficult at all Not difficult at all Not difficult at all -    phq 9 is negative   Fall Risk: Fall Risk  04/16/2019 04/01/2019 03/29/2019 02/22/2019 09/19/2017  Falls in the past year? 0 0 1 1 Yes  Number falls in past yr: 0 - 0 0 1  Injury with Fall? 0 - 0 1 No  Comment - - - hurt toe, knot in toe -    Functional Status Survey: Is the patient deaf or have difficulty hearing?: No Does the patient have difficulty seeing, even when wearing glasses/contacts?: No Does the patient have difficulty concentrating, remembering, or making decisions?: No Does the patient have difficulty walking or climbing stairs?: No Does the patient have difficulty dressing or bathing?: No Does the patient have difficulty doing errands alone such as visiting a doctor's office or shopping?: No   Assessment & Plan  1. Pre-operative clearance  Explained that he needs to take bp medication today, and hold tomorrow Stay off aspirin He has anemia and needs to recheck HCT after surgery/during follow up  - if significant bleed during surgery  Low risk based on type of  surgery , may proceed without further testing EKG reviewed   2. Hypertension goal BP (blood pressure) < 140/90  He has not been taking bp medication for 3 days, advised to stay off Norvasc and take losartan hctz today when he gets  home  He seems to have difficulty understanding instructions. I will ask his wife to come in with him for his next visit, also advised [facility] to give him written instructions and discuss it with his wife upon discharge.  May be lower education level or mild cognitive impairment.I am not familiar with patient    3. Hemiparesis affecting left side as late effect of stroke ([place])  Explained importance of resuming statin and aspirin after surgery   4. Anemia of chronic disease  We will monitor   5. Umbilical hernia without obstruction and without gangrene  Having surgery tomorrow

## 2019-04-16 NOTE — Patient Instructions (Addendum)
Take only Losartan hctz today when you get home - one of the bp medications  Make sure you get the refill of pravastatin from the pharmacy, it was sent 02/2019

## 2019-04-16 NOTE — Progress Notes (Signed)
Medical Clearance received from Dr Ancil Boozer at [facility]. Patient is cleared for surgery at low risk.

## 2019-04-16 NOTE — Pre-Procedure Instructions (Signed)
REGARDING MEDICAL CLEARANCE:  Patient has an appointment with Dr. Steele Sizer at 1300 today for medical clearance.

## 2019-04-17 ENCOUNTER — Ambulatory Visit: Payer: Medicare HMO | Admitting: Anesthesiology

## 2019-04-17 ENCOUNTER — Ambulatory Visit: Payer: Medicare HMO

## 2019-04-17 ENCOUNTER — Other Ambulatory Visit: Payer: Self-pay

## 2019-04-17 ENCOUNTER — Ambulatory Visit
Admission: RE | Admit: 2019-04-17 | Discharge: 2019-04-17 | Disposition: A | Discharge status: Home / Self Care | Payer: Medicare HMO | Attending: Surgery | Admitting: Surgery

## 2019-04-17 ENCOUNTER — Encounter
Admission: RE | Disposition: A | Discharge status: Home / Self Care | Payer: Self-pay | Source: Home / Self Care | Attending: Surgery

## 2019-04-17 DIAGNOSIS — Z8261 Family history of arthritis: Secondary | ICD-10-CM | POA: Diagnosis not present

## 2019-04-17 DIAGNOSIS — K429 Umbilical hernia without obstruction or gangrene: Secondary | ICD-10-CM | POA: Diagnosis not present

## 2019-04-17 DIAGNOSIS — Z833 Family history of diabetes mellitus: Secondary | ICD-10-CM | POA: Insufficient documentation

## 2019-04-17 DIAGNOSIS — R3912 Poor urinary stream: Secondary | ICD-10-CM | POA: Diagnosis not present

## 2019-04-17 DIAGNOSIS — I69354 Hemiplegia and hemiparesis following cerebral infarction affecting left non-dominant side: Secondary | ICD-10-CM | POA: Diagnosis not present

## 2019-04-17 DIAGNOSIS — N35812 Other urethral bulbous stricture, male: Secondary | ICD-10-CM | POA: Diagnosis not present

## 2019-04-17 DIAGNOSIS — N390 Urinary tract infection, site not specified: Secondary | ICD-10-CM | POA: Insufficient documentation

## 2019-04-17 DIAGNOSIS — Z8673 Personal history of transient ischemic attack (TIA), and cerebral infarction without residual deficits: Secondary | ICD-10-CM | POA: Diagnosis not present

## 2019-04-17 DIAGNOSIS — Z841 Family history of disorders of kidney and ureter: Secondary | ICD-10-CM | POA: Insufficient documentation

## 2019-04-17 DIAGNOSIS — R972 Elevated prostate specific antigen [PSA]: Secondary | ICD-10-CM | POA: Insufficient documentation

## 2019-04-17 DIAGNOSIS — Z8249 Family history of ischemic heart disease and other diseases of the circulatory system: Secondary | ICD-10-CM | POA: Diagnosis not present

## 2019-04-17 DIAGNOSIS — R32 Unspecified urinary incontinence: Secondary | ICD-10-CM | POA: Diagnosis not present

## 2019-04-17 DIAGNOSIS — I739 Peripheral vascular disease, unspecified: Secondary | ICD-10-CM | POA: Insufficient documentation

## 2019-04-17 DIAGNOSIS — I10 Essential (primary) hypertension: Secondary | ICD-10-CM | POA: Diagnosis not present

## 2019-04-17 DIAGNOSIS — E785 Hyperlipidemia, unspecified: Secondary | ICD-10-CM | POA: Diagnosis not present

## 2019-04-17 DIAGNOSIS — M1990 Unspecified osteoarthritis, unspecified site: Secondary | ICD-10-CM | POA: Diagnosis not present

## 2019-04-17 DIAGNOSIS — Z8042 Family history of malignant neoplasm of prostate: Secondary | ICD-10-CM | POA: Diagnosis not present

## 2019-04-17 DIAGNOSIS — Z7689 Persons encountering health services in other specified circumstances: Secondary | ICD-10-CM | POA: Diagnosis not present

## 2019-04-17 DIAGNOSIS — Z7982 Long term (current) use of aspirin: Secondary | ICD-10-CM | POA: Insufficient documentation

## 2019-04-17 DIAGNOSIS — Z801 Family history of malignant neoplasm of trachea, bronchus and lung: Secondary | ICD-10-CM | POA: Insufficient documentation

## 2019-04-17 DIAGNOSIS — Z800 Family history of malignant neoplasm of digestive organs: Secondary | ICD-10-CM | POA: Diagnosis not present

## 2019-04-17 DIAGNOSIS — N401 Enlarged prostate with lower urinary tract symptoms: Secondary | ICD-10-CM | POA: Insufficient documentation

## 2019-04-17 DIAGNOSIS — N35912 Unspecified bulbous urethral stricture, male: Secondary | ICD-10-CM | POA: Insufficient documentation

## 2019-04-17 DIAGNOSIS — Z79899 Other long term (current) drug therapy: Secondary | ICD-10-CM | POA: Diagnosis not present

## 2019-04-17 DIAGNOSIS — Z87440 Personal history of urinary (tract) infections: Secondary | ICD-10-CM | POA: Diagnosis not present

## 2019-04-17 DIAGNOSIS — Z83511 Family history of glaucoma: Secondary | ICD-10-CM | POA: Diagnosis not present

## 2019-04-17 DIAGNOSIS — R338 Other retention of urine: Secondary | ICD-10-CM | POA: Diagnosis not present

## 2019-04-17 HISTORY — PX: CYSTOSCOPY WITH RETROGRADE URETHROGRAM: SHX6309

## 2019-04-17 HISTORY — PX: UMBILICAL HERNIA REPAIR: SHX196

## 2019-04-17 HISTORY — PX: URETHROTOMY: SHX1083

## 2019-04-17 SURGERY — REPAIR, HERNIA, UMBILICAL, ADULT
Anesthesia: General

## 2019-04-17 MED ORDER — GABAPENTIN 300 MG PO CAPS
ORAL_CAPSULE | ORAL | Status: AC
  Filled 2019-04-17: qty 1

## 2019-04-17 MED ORDER — DEXAMETHASONE SODIUM PHOSPHATE 10 MG/ML IJ SOLN
INTRAMUSCULAR | Status: AC
  Filled 2019-04-17: qty 1

## 2019-04-17 MED ORDER — BUPIVACAINE-EPINEPHRINE (PF) 0.5% -1:200000 IJ SOLN
INTRAMUSCULAR | Status: DC | PRN
  Administered 2019-04-17: 25 mL

## 2019-04-17 MED ORDER — SUGAMMADEX SODIUM 200 MG/2ML IV SOLN
INTRAVENOUS | Status: DC | PRN
  Administered 2019-04-17: 200 mg via INTRAVENOUS

## 2019-04-17 MED ORDER — CHLORHEXIDINE GLUCONATE CLOTH 2 % EX PADS: 6.0000 | MEDICATED_PAD | Freq: Once | CUTANEOUS | Status: DC

## 2019-04-17 MED ORDER — LIDOCAINE HCL (CARDIAC) PF 100 MG/5ML IV SOSY
PREFILLED_SYRINGE | INTRAVENOUS | Status: DC | PRN
  Administered 2019-04-17: 60 mg via INTRAVENOUS

## 2019-04-17 MED ORDER — ACETAMINOPHEN 500 MG PO TABS
ORAL_TABLET | ORAL | Status: AC
  Filled 2019-04-17: qty 2

## 2019-04-17 MED ORDER — GABAPENTIN 300 MG PO CAPS
300.0000 mg | ORAL_CAPSULE | ORAL | Status: AC
  Administered 2019-04-17: 300 mg via ORAL

## 2019-04-17 MED ORDER — BUPIVACAINE LIPOSOME 1.3 % IJ SUSP
INTRAMUSCULAR | Status: AC
  Filled 2019-04-17: qty 20

## 2019-04-17 MED ORDER — ACETAMINOPHEN 500 MG PO TABS
1000.0000 mg | ORAL_TABLET | ORAL | Status: AC
  Administered 2019-04-17: 1000 mg via ORAL

## 2019-04-17 MED ORDER — PROPOFOL 10 MG/ML IV BOLUS
INTRAVENOUS | Status: DC | PRN
  Administered 2019-04-17: 120 mg via INTRAVENOUS

## 2019-04-17 MED ORDER — IBUPROFEN 600 MG PO TABS: 600.0000 mg | ORAL_TABLET | Freq: Three times a day (TID) | ORAL | 1 refills | Status: DC | PRN

## 2019-04-17 MED ORDER — MEPERIDINE HCL 50 MG/ML IJ SOLN: 6.2500 mg | INTRAMUSCULAR | Status: DC | PRN

## 2019-04-17 MED ORDER — ONDANSETRON HCL 4 MG/2ML IJ SOLN
INTRAMUSCULAR | Status: AC
  Filled 2019-04-17: qty 2

## 2019-04-17 MED ORDER — KETOROLAC TROMETHAMINE 30 MG/ML IJ SOLN
INTRAMUSCULAR | Status: AC
  Filled 2019-04-17: qty 1

## 2019-04-17 MED ORDER — OXYCODONE HCL 5 MG PO TABS: 5.0000 mg | ORAL_TABLET | ORAL | 0 refills | Status: DC | PRN

## 2019-04-17 MED ORDER — DEXAMETHASONE SODIUM PHOSPHATE 10 MG/ML IJ SOLN
INTRAMUSCULAR | Status: DC | PRN
  Administered 2019-04-17: 10 mg via INTRAVENOUS

## 2019-04-17 MED ORDER — ROCURONIUM BROMIDE 10 MG/ML (PF) SYRINGE
PREFILLED_SYRINGE | INTRAVENOUS | Status: AC
  Filled 2019-04-17: qty 10

## 2019-04-17 MED ORDER — LACTATED RINGERS IV SOLN
INTRAVENOUS | Status: DC
  Administered 2019-04-17: 50 mL/h via INTRAVENOUS

## 2019-04-17 MED ORDER — FAMOTIDINE 20 MG PO TABS
ORAL_TABLET | ORAL | Status: AC
  Filled 2019-04-17: qty 1

## 2019-04-17 MED ORDER — PROMETHAZINE HCL 25 MG/ML IJ SOLN: 6.2500 mg | INTRAMUSCULAR | Status: DC | PRN

## 2019-04-17 MED ORDER — CEFAZOLIN SODIUM-DEXTROSE 2-4 GM/100ML-% IV SOLN
INTRAVENOUS | Status: AC
  Filled 2019-04-17: qty 100

## 2019-04-17 MED ORDER — KETOROLAC TROMETHAMINE 30 MG/ML IJ SOLN
INTRAMUSCULAR | Status: DC | PRN
  Administered 2019-04-17: 30 mg via INTRAVENOUS

## 2019-04-17 MED ORDER — ONDANSETRON HCL 4 MG/2ML IJ SOLN
INTRAMUSCULAR | Status: DC | PRN
  Administered 2019-04-17: 4 mg via INTRAVENOUS

## 2019-04-17 MED ORDER — ROCURONIUM BROMIDE 100 MG/10ML IV SOLN
INTRAVENOUS | Status: DC | PRN
  Administered 2019-04-17: 20 mg via INTRAVENOUS
  Administered 2019-04-17: 10 mg via INTRAVENOUS
  Administered 2019-04-17: 50 mg via INTRAVENOUS

## 2019-04-17 MED ORDER — PROPOFOL 10 MG/ML IV BOLUS
INTRAVENOUS | Status: AC
  Filled 2019-04-17: qty 40

## 2019-04-17 MED ORDER — OXYCODONE HCL 5 MG/5ML PO SOLN: 5.0000 mg | Freq: Once | ORAL | Status: DC | PRN

## 2019-04-17 MED ORDER — FENTANYL CITRATE (PF) 100 MCG/2ML IJ SOLN: 25.0000 ug | INTRAMUSCULAR | Status: DC | PRN

## 2019-04-17 MED ORDER — FAMOTIDINE 20 MG PO TABS
20.0000 mg | ORAL_TABLET | Freq: Once | ORAL | Status: AC
  Administered 2019-04-17: 20 mg via ORAL

## 2019-04-17 MED ORDER — FENTANYL CITRATE (PF) 100 MCG/2ML IJ SOLN
INTRAMUSCULAR | Status: AC
  Filled 2019-04-17: qty 2

## 2019-04-17 MED ORDER — BUPIVACAINE HCL (PF) 0.5 % IJ SOLN
INTRAMUSCULAR | Status: AC
  Filled 2019-04-17: qty 30

## 2019-04-17 MED ORDER — FENTANYL CITRATE (PF) 100 MCG/2ML IJ SOLN
INTRAMUSCULAR | Status: DC | PRN
  Administered 2019-04-17 (×2): 50 ug via INTRAVENOUS

## 2019-04-17 MED ORDER — EPINEPHRINE PF 1 MG/ML IJ SOLN
INTRAMUSCULAR | Status: AC
  Filled 2019-04-17: qty 1

## 2019-04-17 MED ORDER — CEFAZOLIN SODIUM-DEXTROSE 2-4 GM/100ML-% IV SOLN
2.0000 g | INTRAVENOUS | Status: AC
  Administered 2019-04-17: 2 g via INTRAVENOUS

## 2019-04-17 MED ORDER — EPHEDRINE SULFATE 50 MG/ML IJ SOLN
INTRAMUSCULAR | Status: DC | PRN
  Administered 2019-04-17: 10 mg via INTRAVENOUS
  Administered 2019-04-17: 5 mg via INTRAVENOUS
  Administered 2019-04-17: 10 mg via INTRAVENOUS

## 2019-04-17 MED ORDER — IOHEXOL 180 MG/ML  SOLN
INTRAMUSCULAR | Status: DC | PRN
  Administered 2019-04-17: 2 mL

## 2019-04-17 MED ORDER — OXYCODONE HCL 5 MG PO TABS: 5.0000 mg | ORAL_TABLET | Freq: Once | ORAL | Status: DC | PRN

## 2019-04-17 MED ORDER — BUPIVACAINE LIPOSOME 1.3 % IJ SUSP
INTRAMUSCULAR | Status: DC | PRN
  Administered 2019-04-17: 15 mL

## 2019-04-17 MED ORDER — CHLORHEXIDINE GLUCONATE CLOTH 2 % EX PADS
6.0000 | MEDICATED_PAD | Freq: Once | CUTANEOUS | Status: AC
  Administered 2019-04-17: 6 via TOPICAL

## 2019-04-17 SURGICAL SUPPLY — 55 items
BAG DRAIN CYSTO-URO LG1000N (MISCELLANEOUS) ×2 IMPLANT
BAG URINE DRAIN 2000ML AR STRL (UROLOGICAL SUPPLIES) ×2 IMPLANT
BASIN GRAD PLASTIC 32OZ STRL (MISCELLANEOUS) ×2 IMPLANT
BLADE SURG 15 STRL LF DISP TIS (BLADE) ×1 IMPLANT
BLADE SURG 15 STRL SS (BLADE) ×1
BRUSH SCRUB EZ 1% IODOPHOR (MISCELLANEOUS) ×2 IMPLANT
CANISTER SUCT 1200ML W/VALVE (MISCELLANEOUS) ×2 IMPLANT
CATH FOLEY 2W COUNCIL 20FR 5CC (CATHETERS) ×1 IMPLANT
CATH FOLEY 2W COUNCIL 5CC 18FR (CATHETERS) ×1 IMPLANT
CATH SET URETHRAL DILATOR (CATHETERS) ×2 IMPLANT
CHLORAPREP W/TINT 26 (MISCELLANEOUS) ×2 IMPLANT
CONRAY 43 FOR UROLOGY 50M (MISCELLANEOUS) ×2 IMPLANT
COVER WAND RF STERILE (DRAPES) ×4 IMPLANT
DERMABOND ADVANCED (GAUZE/BANDAGES/DRESSINGS) ×1
DERMABOND ADVANCED .7 DNX12 (GAUZE/BANDAGES/DRESSINGS) ×1 IMPLANT
DRAPE 3/4 80X56 (DRAPES) ×2 IMPLANT
DRAPE LAPAROTOMY 77X122 PED (DRAPES) ×2 IMPLANT
ELECT REM PT RETURN 9FT ADLT (ELECTROSURGICAL) ×4
ELECTRODE REM PT RTRN 9FT ADLT (ELECTROSURGICAL) ×2 IMPLANT
GLOVE BIO SURGEON STRL SZ8 (GLOVE) ×2 IMPLANT
GLOVE BIOGEL M 8.0 STRL (GLOVE) ×2 IMPLANT
GLOVE SURG SYN 7.0 (GLOVE) ×2 IMPLANT
GLOVE SURG SYN 7.0 PF PI (GLOVE) ×1 IMPLANT
GLOVE SURG SYN 7.5  E (GLOVE) ×1
GLOVE SURG SYN 7.5 E (GLOVE) ×1 IMPLANT
GLOVE SURG SYN 7.5 PF PI (GLOVE) ×1 IMPLANT
GOWN STRL REUS W/ TWL LRG LVL3 (GOWN DISPOSABLE) ×3 IMPLANT
GOWN STRL REUS W/ TWL XL LVL3 (GOWN DISPOSABLE) ×1 IMPLANT
GOWN STRL REUS W/TWL LRG LVL3 (GOWN DISPOSABLE) ×3
GOWN STRL REUS W/TWL XL LVL3 (GOWN DISPOSABLE) ×1
GUIDEWIRE STR DUAL SENSOR (WIRE) ×1 IMPLANT
KIT BALLN UROMAX 15FX4 (MISCELLANEOUS) IMPLANT
KIT BALLN UROMAX 26 75X4 (MISCELLANEOUS) ×1
KIT TURNOVER CYSTO (KITS) ×2 IMPLANT
MESH VENTRALEX ST 8CM LRG (Mesh General) ×1 IMPLANT
NEEDLE HYPO 22GX1.5 SAFETY (NEEDLE) ×2 IMPLANT
NS IRRIG 500ML POUR BTL (IV SOLUTION) ×2 IMPLANT
PACK BASIN MINOR ARMC (MISCELLANEOUS) ×2 IMPLANT
PACK CYSTO AR (MISCELLANEOUS) ×2 IMPLANT
SET CYSTO W/LG BORE CLAMP LF (SET/KITS/TRAYS/PACK) ×2 IMPLANT
SOL PREP PVP 2OZ (MISCELLANEOUS) ×2
SOLUTION PREP PVP 2OZ (MISCELLANEOUS) ×1 IMPLANT
SURGILUBE 2OZ TUBE FLIPTOP (MISCELLANEOUS) ×2 IMPLANT
SUT ETHIBOND 0 MO6 C/R (SUTURE) ×2 IMPLANT
SUT MNCRL AB 4-0 PS2 18 (SUTURE) ×2 IMPLANT
SUT PROLENE 2 0 SH DA (SUTURE) ×1 IMPLANT
SUT VIC AB 2-0 SH 27 (SUTURE) ×1
SUT VIC AB 2-0 SH 27XBRD (SUTURE) ×1 IMPLANT
SUT VIC AB 3-0 SH 27 (SUTURE) ×1
SUT VIC AB 3-0 SH 27X BRD (SUTURE) ×1 IMPLANT
SYR 20ML LL LF (SYRINGE) ×2 IMPLANT
SYR BULB IRRIG 60ML STRL (SYRINGE) ×2 IMPLANT
SYRINGE IRR TOOMEY STRL 70CC (SYRINGE) ×2 IMPLANT
WATER STERILE IRR 1000ML POUR (IV SOLUTION) ×2 IMPLANT
WATER STERILE IRR 3000ML UROMA (IV SOLUTION) ×2 IMPLANT

## 2019-04-17 NOTE — Anesthesia Preprocedure Evaluation (Addendum)
Anesthesia Evaluation  Patient identified by MRN, date of birth, ID band Patient awake    Reviewed: Allergy & Precautions, NPO status , Patient's Chart, lab work & pertinent test results  History of Anesthesia Complications Negative for: history of anesthetic complications  Airway Mallampati: II  TM Distance: >3 FB Neck ROM: Full    Dental  (+) Poor Dentition, Chipped, Missing   Pulmonary neg pulmonary ROS, neg sleep apnea, neg COPD, Patient abstained from smoking.Not current smoker,    breath sounds clear to auscultation- rhonchi (-) wheezing      Cardiovascular Exercise Tolerance: Good METShypertension, Pt. on medications + Peripheral Vascular Disease  (-) CAD and (-) Past MI (-) dysrhythmias  Rhythm:Regular Rate:Normal - Systolic murmurs and - Diastolic murmurs    Neuro/Psych Left sided weakness CVA, Residual Symptoms negative psych ROS   GI/Hepatic neg GERD  ,(+)     (-) substance abuse  ,   Endo/Other  neg diabetes  Renal/GU negative Renal ROS     Musculoskeletal  (+) Arthritis ,   Abdominal (+) - obese,   Peds  Hematology   Anesthesia Other Findings Past Medical History: 06/21/2016: Family hx of colon cancer requiring screening colonoscopy     Comment:  Father at age 15 06/22/2011: History of CVA (cerebrovascular accident)     Comment:  Overview:  right sided stroke in 2013 (basal ganglia,               right insular region, and white matter of the left               frontal lobe  No date: Hyperlipidemia No date: Hypertension 2013: Stroke [facility])  Reproductive/Obstetrics                           Anesthesia Physical Anesthesia Plan  ASA: III  Anesthesia Plan: General   Post-op Pain Management:    Induction: Intravenous  PONV Risk Score and Plan: 1 and Ondansetron and Dexamethasone  Airway Management Planned: Oral ETT  Additional Equipment:   Intra-op Plan:    Post-operative Plan: Extubation in OR  Informed Consent: I have reviewed the patients History and Physical, chart, labs and discussed the procedure including the risks, benefits and alternatives for the proposed anesthesia with the patient or authorized representative who has indicated his/her understanding and acceptance.     Dental advisory given  Plan Discussed with: CRNA and Anesthesiologist  Anesthesia Plan Comments:         Anesthesia Quick Evaluation

## 2019-04-17 NOTE — Transfer of Care (Signed)
Immediate Anesthesia Transfer of Care Note  Patient: Xavier [name]  Procedure(s) Performed: HERNIA REPAIR UMBILICAL ADULT (N/A ) CYSTOSCOPY WITH BALLOON DILATION OF URETHRAL STRICTURE (N/A ) DIRECT VISION INTERNAL URETHROTOMY (N/A )  Patient Location: PACU  Anesthesia Type:General  Level of Consciousness: drowsy  Airway & Oxygen Therapy: Patient Spontanous Breathing and Patient connected to face mask oxygen  Post-op Assessment: Report given to RN and Post -op Vital signs reviewed and stable  Post vital signs: Reviewed and stable  Last Vitals:  Vitals Value Taken Time  BP 145/88 04/17/19 1629  Temp    Pulse 67 04/17/19 1630  Resp 11 04/17/19 1630  SpO2 100 % 04/17/19 1630  Vitals shown include unvalidated device data.  Last Pain:  Vitals:   04/17/19 1108  TempSrc: Tympanic  PainSc: 0-No pain         Complications: No apparent anesthesia complications

## 2019-04-17 NOTE — Op Note (Signed)
Procedure Date:  04/17/2019  Pre-operative Diagnosis:   Umbilical hernia  Post-operative Diagnosis:  Umbilical hernia  Procedure:  Umbilical hernia repair with mesh  Surgeon:  Melvyn Neth, MD  Anesthesia:  General endotracheal  Estimated Blood Loss:  10 ml  Specimens:  Hernia sac  Complications:  None  Indications for Procedure:  This is a 69 y.o. male who presents with a symptomatic umbilical hernia.  The risks of bleeding, abscess or infection, injury to surrounding structures, and need for further procedures were all discussed with the patient and was willing to proceed.  Of note, the patient also had a urethral stricture and underwent cystoscopy with balloon dilation with Dr. Bernardo Heater prior to my surgery.  Description of Procedure: The patient was correctly identified in the preoperative area and brought into the operating room.  The patient was placed supine with VTE prophylaxis in place.  Appropriate time-outs were performed.  Anesthesia was induced and the patient was intubated.  Appropriate antibiotics were infused.  Dr. Bernardo Heater proceeded with his procedure.  Please see his op note for details.  The patient was then placed in supine position.  The abdomen was prepped and draped in a sterile fashion.  A supraumbilical 5 cm incision was made and cautery was used to dissect down the subcutaneous tissue along the umbilical stalk.  Kelly forceps were used to dissect the umbilical stalk and it was divided at the fascia using cautery.  This allowed visualization of the hernia defect.  The hernia was reduced without complications.  The hernia sac was dissected and excised with cautery.  There was some bleeding from the preperitoneal fat which was cauterized.  The fascial edges were cleaned using cautery.  A large Bard Ventralex ST mesh was introduced via the defect and spread open.  The tails were secured to the fascia using 2-0 Prolene sutures.  The fascia was then closed with 0 Ethibond  sutures, incorporating a layer of the mesh with each suture.  Some excess skin from the umbilical stalk was excised using cautery in order to provide better cosmetic result.  The umbilical stalk was then reattached to the fascia using 2-0 Vicryl. The wound was irrigated and local anesthetic was infused.  The wound was then closed in layers using 3-0 Vicryl and 4-0 Monocryl. The incision was cleaned and sealed with DermaBond.  The patient was emerged from anesthesia and extubated and brought to the recovery room for further management.  The patient tolerated the procedure well and all counts were correct at the end of the case.   Melvyn Neth, MD

## 2019-04-17 NOTE — Op Note (Signed)
Preoperative diagnosis:  1. Bulbar urethral stricture  Postoperative diagnosis:  1. Same  Procedure: 1. Balloon dilation urethral stricture 2. Direct vision internal urethrotomy  Surgeon: Abbie Sons, MD  Anesthesia: General  Complications: None  Intraoperative findings:  1. ~8 French distal bulb stricture with estimated length 5 mm  EBL: Minimal  Specimens: None  Indication: Xavier [name] is a 69 y.o. male seen by Dr. Diamantina Providence for obstructive voiding symptoms, nocturnal incontinence and PVR 400 mL.  Cystoscopy in office remarkable for a bulbar urethral stricture.  He is scheduled for umbilical hernia repair by Dr. Hampton Abbot and will undergo treatment of his urethral stricture under the same anesthetic.  After reviewing the management options for treatment, he elected to proceed with the above surgical procedure(s). We have discussed the potential benefits and risks of the procedure, side effects of the proposed treatment, the likelihood of the patient achieving the goals of the procedure, and any potential problems that might occur during the procedure or recuperation. Informed consent has been obtained.  Description of procedure:  The patient was taken to the operating room and general anesthesia was induced.  The patient was placed in the dorsal lithotomy position, prepped and draped in the usual sterile fashion, and preoperative antibiotics were administered. A preoperative time-out was performed.   A 21 French cystoscope was lubricated and passed per urethra.  In the distal bulbous urethra a stricture was noted.  A 0.038 Sensor wire was placed through the cystoscope and advanced through the stricture into the bladder without difficulty.  Wire positioning was noted with C-arm.  A 15 French 4 cm balloon was placed over the wire and advanced to the stricture and inflated to 12 atm.  No balloon narrowing was noted.  After 1 minute the balloon was deflated and removed.  A optical  urethrotome with cold knife was then passed per urethra and the stricture was incised in the 12 o'clock position.  No significant bleeding was noted.  The return was removed and the cystoscope was repassed.  The prostate showed mild lateral lobe enlargement and moderate bladder neck elevation.  The bladder was emptied and panendoscopy was then performed.  There was trabeculation present with scattered cellules.  No mucosal abnormalities were noted including solid or papillary lesions.  The ureteral orifices were normal-appearing bilaterally.  The bladder was emptied and the cystoscope was removed.  A 20 French Councill catheter was placed over the wire and balloon inflated with 10 mL of sterile water.  Plan: He will return to the office on 04/19/2019 for catheter removal and has a follow-up appointment with Dr. Diamantina Providence in approximately 6 weeks.   Abbie Sons, M.D.

## 2019-04-17 NOTE — Interval H&P Note (Signed)
History and Physical Interval Note:  04/17/2019 12:54 PM  Xavier [name]  has presented today for surgery, with the diagnosis of reducible umbilical hernia.  The various methods of treatment have been discussed with the patient and family. After consideration of risks, benefits and other options for treatment, the patient has consented to  Procedure(s): HERNIA REPAIR UMBILICAL ADULT (N/A) CYSTOSCOPY WITH RETROGRADE URETHROGRAM (N/A) DIRECT VISION INTERNAL URETHROTOMY (N/A) as a surgical intervention.  I would be performing the umbilical hernia repair, and Dr. Bernardo Heater would be performing the cystoscopy and urethrotomy.  The patient's history has been reviewed, patient examined, no change in status, stable for surgery.  I have reviewed the patient's chart and labs.  Questions were answered to the patient's satisfaction.     Almeda Ezra

## 2019-04-17 NOTE — Discharge Instructions (Addendum)
Urology Discharge Instructions  -You may notice intermittent blood in your urine which is normal -Contact the office for excessive bleeding (urine tomato juice in appearance) or catheter drainage problems -You have an appointment to have your catheter removed on Friday, 04/19/2019.  If you do not have this appointment at the time of discharge our office will contact you.    Indwelling Urinary Catheter Care, Adult An indwelling urinary catheter is a thin tube that is put into your bladder. The tube helps to drain pee (urine) out of your body. The tube goes in through your urethra. Your urethra is where pee comes out of your body. Your pee will come out through the catheter, then it will go into a bag (drainage bag). Take good care of your catheter so it will work well. How to wear your catheter and bag Supplies needed  Sticky tape (adhesive tape) or a leg strap.  Alcohol wipe or soap and water (if you use tape).  A clean towel (if you use tape).  Large overnight bag.  Smaller bag (leg bag). Wearing your catheter Attach your catheter to your leg with tape or a leg strap.  Make sure the catheter is not pulled tight.  If a leg strap gets wet, take it off and put on a dry strap.  If you use tape to hold the bag on your leg: 1. Use an alcohol wipe or soap and water to wash your skin where the tape made it sticky before. 2. Use a clean towel to pat-dry that skin. 3. Use new tape to make the bag stay on your leg. Wearing your bags You should have been given a large overnight bag.  You may wear the overnight bag in the day or night.  Always have the overnight bag lower than your bladder.  Do not let the bag touch the floor.  Before you go to sleep, put a clean plastic bag in a wastebasket. Then hang the overnight bag inside the wastebasket. You should also have a smaller leg bag that fits under your clothes.  Always wear the leg bag below your knee.  Do not wear your leg bag at  night. How to care for your skin and catheter Supplies needed  A clean washcloth.  Water and mild soap.  A clean towel. Caring for your skin and catheter      Clean the skin around your catheter every day: 1. Wash your hands with soap and water. 2. Wet a clean washcloth in warm water and mild soap. 3. Clean the skin around your urethra.  If you are male:  Gently spread the folds of skin around your vagina (labia).  With the washcloth in your other hand, wipe the inner side of your labia on each side. Wipe from front to back.  If you are male:  Pull back any skin that covers the end of your penis (foreskin).  With the washcloth in your other hand, wipe your penis in small circles. Start wiping at the tip of your penis, then move away from the catheter.  Move the foreskin back in place, if needed. 4. With your free hand, hold the catheter close to where it goes into your body.  Keep holding the catheter during cleaning so it does not get pulled out. 5. With the washcloth in your other hand, clean the catheter.  Only wipe downward on the catheter.  Do not wipe upward toward your body. Doing this may push germs into your urethra  and cause infection. 6. Use a clean towel to pat-dry the catheter and the skin around it. Make sure to wipe off all soap. 7. Wash your hands with soap and water.  Shower every day. Do not take baths.  Do not use cream, ointment, or lotion on the area where the catheter goes into your body, unless your doctor tells you to.  Do not use powders, sprays, or lotions on your genital area.  Check your skin around the catheter every day for signs of infection. Check for: ? Redness, swelling, or pain. ? Fluid or blood. ? Warmth. ? Pus or a bad smell. How to empty the bag Supplies needed  Rubbing alcohol.  Gauze pad or cotton ball.  Tape or a leg strap. Emptying the bag Pour the pee out of your bag when it is ?- full, or at least 2-3 times  a day. Do this for your overnight bag and your leg bag. 1. Wash your hands with soap and water. 2. Separate (detach) the bag from your leg. 3. Hold the bag over the toilet or a clean pail. Keep the bag lower than your hips and bladder. This is so the pee (urine) does not go back into the tube. 4. Open the pour spout. It is at the bottom of the bag. 5. Empty the pee into the toilet or pail. Do not let the pour spout touch any surface. 6. Put rubbing alcohol on a gauze pad or cotton ball. 7. Use the gauze pad or cotton ball to clean the pour spout. 8. Close the pour spout. 9. Attach the bag to your leg with tape or a leg strap. 10. Wash your hands with soap and water. Follow instructions for cleaning the drainage bag:  From the product maker.  As told by your doctor. How to change the bag Supplies needed  Alcohol wipes.  A clean bag.  Tape or a leg strap. Changing the bag Replace your bag when it starts to leak, smell bad, or look dirty. 1. Wash your hands with soap and water. 2. Separate the dirty bag from your leg. 3. Pinch the catheter with your fingers so that pee does not spill out. 4. Separate the catheter tube from the bag tube where these tubes connect (at the connection valve). Do not let the tubes touch any surface. 5. Clean the end of the catheter tube with an alcohol wipe. Use a different alcohol wipe to clean the end of the bag tube. 6. Connect the catheter tube to the tube of the clean bag. 7. Attach the clean bag to your leg with tape or a leg strap. Do not make the bag tight on your leg. 8. Wash your hands with soap and water. General rules   Never pull on your catheter. Never try to take it out. Doing that can hurt you.  Always wash your hands before and after you touch your catheter or bag. Use a mild, fragrance-free soap. If you do not have soap and water, use hand sanitizer.  Always make sure there are no twists or bends (kinks) in the catheter  tube.  Always make sure there are no leaks in the catheter or bag.  Drink enough fluid to keep your pee pale yellow.  Do not take baths, swim, or use a hot tub.  If you are male, wipe from front to back after you poop (have a bowel movement). Contact a doctor if:  Your pee is cloudy.  Your pee smells  worse than usual.  Your catheter gets clogged.  Your catheter leaks.  Your bladder feels full. Get help right away if:  You have redness, swelling, or pain where the catheter goes into your body.  You have fluid, blood, pus, or a bad smell coming from the area where the catheter goes into your body.  Your skin feels warm where the catheter goes into your body.  You have a fever.  You have pain in your: ? Belly (abdomen). ? Legs. ? Lower back. ? Bladder.  You see blood in the catheter.  Your pee is pink or red.  You feel sick to your stomach (nauseous).  You throw up (vomit).  You have chills.  Your pee is not draining into the bag.  Your catheter gets pulled out. Summary  An indwelling urinary catheter is a thin tube that is placed into the bladder to help drain pee (urine) out of the body.  The catheter is placed into the part of the body that drains pee from the bladder (urethra).  Taking good care of your catheter will keep it working properly and help prevent problems.  Always wash your hands before and after touching your catheter or bag.  Never pull on your catheter or try to take it out. This information is not intended to replace advice given to you by your health care provider. Make sure you discuss any questions you have with your health care provider. Document Revised: 04/20/2018 Document Reviewed: 08/12/2016 Elsevier Patient Education  2020 [place]   1) The drugs that you were given will stay in your system until tomorrow so for the next 24 hours you should not:  A) Drive an  automobile B) Make any legal decisions C) Drink any alcoholic beverage   2) You may resume regular meals tomorrow.  Today it is better to start with liquids and gradually work up to solid foods.  You may eat anything you prefer, but it is better to start with liquids, then soup and crackers, and gradually work up to solid foods.   3) Please notify your doctor immediately if you have any unusual bleeding, trouble breathing, redness and pain at the surgery site, drainage, fever, or pain not relieved by medication.    4) Additional Instructions:        Please contact your physician with any problems or Same Day Surgery at [phone], Monday through Friday 6 am to 4 pm, or [place] at [facility] number at [phone].

## 2019-04-17 NOTE — Interval H&P Note (Signed)
History and Physical Interval Note:  04/17/2019 1:59 PM  Xavier [name]  has presented today for surgery, with the diagnosis of reducible umbilical hernia.  The various methods of treatment have been discussed with the patient and family. After consideration of risks, benefits and other options for treatment, the patient has consented to  Procedure(s): HERNIA REPAIR UMBILICAL ADULT (N/A) CYSTOSCOPY WITH RETROGRADE URETHROGRAM (N/A) DIRECT VISION INTERNAL URETHROTOMY (N/A) as a surgical intervention.  The patient's history has been reviewed, patient examined, no change in status, stable for surgery.  I have reviewed the patient's chart and labs.  Questions were answered to the patient's satisfaction.     [place]

## 2019-04-17 NOTE — Anesthesia Procedure Notes (Signed)
Procedure Name: Intubation Date/Time: 04/17/2019 2:16 PM Performed by: Gentry Fitz, CRNA Pre-anesthesia Checklist: Patient identified, Emergency Drugs available, Suction available and Patient being monitored Patient Re-evaluated:Patient Re-evaluated prior to induction Oxygen Delivery Method: Circle system utilized Preoxygenation: Pre-oxygenation with 100% oxygen Induction Type: IV induction Ventilation: Mask ventilation without difficulty Laryngoscope Size: McGraph and 4 Grade View: Grade I Tube type: Oral Tube size: 7.5 mm Number of attempts: 1 Airway Equipment and Method: Stylet Placement Confirmation: ETT inserted through vocal cords under direct vision,  positive ETCO2 and breath sounds checked- equal and bilateral Secured at: 20 cm Tube secured with: Tape Dental Injury: Teeth and Oropharynx as per pre-operative assessment

## 2019-04-17 NOTE — Anesthesia Postprocedure Evaluation (Signed)
Anesthesia Post Note  Patient: Xavier [name]  Procedure(s) Performed: HERNIA REPAIR UMBILICAL ADULT (N/A ) CYSTOSCOPY WITH BALLOON DILATION OF URETHRAL STRICTURE (N/A ) DIRECT VISION INTERNAL URETHROTOMY (N/A )  Patient location during evaluation: PACU Anesthesia Type: General Level of consciousness: awake and alert Pain management: pain level controlled Vital Signs Assessment: post-procedure vital signs reviewed and stable Respiratory status: spontaneous breathing, nonlabored ventilation, respiratory function stable and patient connected to nasal cannula oxygen Cardiovascular status: blood pressure returned to baseline and stable Postop Assessment: no apparent nausea or vomiting Anesthetic complications: no     Last Vitals:  Vitals:   04/17/19 1715 04/17/19 1739  BP: (!) 158/94 (!) 161/89  Pulse: 76 85  Resp: 18 16  Temp: (!) 36.2 C 36.5 C  SpO2: 98% 96%    Last Pain:  Vitals:   04/17/19 1739  TempSrc: Temporal  PainSc: 0-No pain                 Martha Clan

## 2019-04-18 ENCOUNTER — Encounter: Payer: Medicare HMO | Admitting: Urology

## 2019-04-19 ENCOUNTER — Other Ambulatory Visit: Payer: Self-pay

## 2019-04-19 ENCOUNTER — Ambulatory Visit (INDEPENDENT_AMBULATORY_CARE_PROVIDER_SITE_OTHER): Payer: Medicare HMO | Admitting: Physician Assistant

## 2019-04-19 ENCOUNTER — Other Ambulatory Visit: Payer: Self-pay | Admitting: Surgery

## 2019-04-19 ENCOUNTER — Ambulatory Visit: Payer: Medicare HMO | Admitting: Physician Assistant

## 2019-04-19 DIAGNOSIS — N35912 Unspecified bulbous urethral stricture, male: Secondary | ICD-10-CM

## 2019-04-19 DIAGNOSIS — T8149XA Infection following a procedure, other surgical site, initial encounter: Secondary | ICD-10-CM

## 2019-04-19 LAB — SURGICAL PATHOLOGY

## 2019-04-19 MED ORDER — CEPHALEXIN 500 MG PO CAPS: 500.0000 mg | ORAL_CAPSULE | Freq: Two times a day (BID) | ORAL | 0 refills | Status: AC

## 2019-04-19 NOTE — Progress Notes (Signed)
Catheter Removal  Patient is present today for a catheter removal.  38ml of water was drained from the balloon. A 20FR council tip foley cath was removed from the bladder no complications were noted . Patient tolerated well.  Performed by: Debroah Loop, PA-C   Follow up: Keep 6 week follow-up with Dr. Diamantina Providence, call if urinary symptoms return prior to this scheduled visit.   Additional Notes: Patient noted to have erythema, warmth, edema, and tenderness surrounding his umbilical incision s/p concurrent hernia repair.  He is afebrile.  Communicated this with Gen Surg team and saved photo to pt's chart for further evaluation. Dr. Hampton Abbot to prescribe antibiotics. Patient sent home.

## 2019-04-20 ENCOUNTER — Other Ambulatory Visit: Payer: Self-pay

## 2019-04-20 ENCOUNTER — Emergency Department
Admission: EM | Admit: 2019-04-20 | Discharge: 2019-04-20 | Disposition: A | Discharge status: Home / Self Care | Payer: Medicare HMO | Attending: Student in an Organized Health Care Education/Training Program | Admitting: Student in an Organized Health Care Education/Training Program

## 2019-04-20 ENCOUNTER — Encounter: Payer: Self-pay | Admitting: Emergency Medicine

## 2019-04-20 DIAGNOSIS — Z79899 Other long term (current) drug therapy: Secondary | ICD-10-CM | POA: Diagnosis not present

## 2019-04-20 DIAGNOSIS — R300 Dysuria: Secondary | ICD-10-CM

## 2019-04-20 DIAGNOSIS — Z7982 Long term (current) use of aspirin: Secondary | ICD-10-CM | POA: Insufficient documentation

## 2019-04-20 DIAGNOSIS — I69359 Hemiplegia and hemiparesis following cerebral infarction affecting unspecified side: Secondary | ICD-10-CM | POA: Insufficient documentation

## 2019-04-20 DIAGNOSIS — I10 Essential (primary) hypertension: Secondary | ICD-10-CM | POA: Diagnosis not present

## 2019-04-20 DIAGNOSIS — R319 Hematuria, unspecified: Secondary | ICD-10-CM | POA: Diagnosis present

## 2019-04-20 LAB — URINALYSIS, COMPLETE (UACMP) WITH MICROSCOPIC
Bacteria, UA: NONE SEEN
Bilirubin Urine: NEGATIVE
Glucose, UA: NEGATIVE mg/dL
Ketones, ur: NEGATIVE mg/dL
Nitrite: NEGATIVE
Protein, ur: NEGATIVE mg/dL
RBC / HPF: >50 RBC/hpf — ABNORMAL HIGH (ref 0–5)
Specific Gravity, Urine: 1.011 (ref 1.005–1.030)
pH: 6 (ref 5.0–8.0)

## 2019-04-20 NOTE — ED Triage Notes (Signed)
Pt to ER reports had a foley post surgery and had it removed yesterday.  States noted blood in urine this AM and was told by MD to come out for evaluation of UTI.

## 2019-04-20 NOTE — ED Provider Notes (Signed)
[facility] Emergency Department Provider Note   ____________________________________________   First MD Initiated Contact with Patient 04/20/19 0800     (approximate)  I have reviewed the triage vital signs and the nursing notes.   HISTORY  Chief Complaint Dysuria   HPI Xavier [name] is a 69 y.o. male presents to the ED with complaint of hematuria this morning.  Patient had surgery and had a Foley placed which was removed yesterday.  Patient states that he called his doctor who wanted him evaluated in the emergency department.  Patient denies any fever, chills, nausea or vomiting.  Currently he rates his pain as a 4 out of 10.     Past Medical History:  Diagnosis Date  . Family hx of colon cancer requiring screening colonoscopy 06/21/2016   Father at age 39  . History of CVA (cerebrovascular accident) 06/22/2011   Overview:  right sided stroke in 2013 (basal ganglia, right insular region, and white matter of the left frontal lobe   . Hyperlipidemia   . Hypertension   . Stroke [facility]) 2013    Patient Active Problem List   Diagnosis Date Noted  . Hemiparesis affecting left side as late effect of cerebrovascular accident ([place]) 04/16/2019  . Prediabetes 03/29/2019  . Lower urinary tract symptoms (LUTS) 02/22/2019  . Impingement syndrome of shoulder, left 09/28/2017  . Hearing loss 02/16/2017  . Umbilical hernia without obstruction and without gangrene 06/21/2016  . Medication monitoring encounter 02/23/2016  . Hyperglycemia 02/04/2016  . Left carotid bruit 02/03/2016  . Arthritis of both hands 03/10/2015  . Spider varicose veins 03/10/2015  . Low serum thyroid stimulating hormone (TSH) 07/22/2014  . Hyperlipidemia LDL goal <70 07/21/2014  . Hypertension goal BP (blood pressure) < 140/90 07/21/2014  . Peripheral arterial disease ([place]) 07/21/2014    Past Surgical History:  Procedure Laterality Date  . CYSTOSCOPY WITH RETROGRADE URETHROGRAM  N/A 04/17/2019   Procedure: CYSTOSCOPY WITH BALLOON DILATION OF URETHRAL STRICTURE;  Surgeon: Abbie Sons, MD;  Location: [facility] ORS;  Service: Urology;  Laterality: N/A;  . TONSILLECTOMY  1958  . TONSILLECTOMY    . UMBILICAL HERNIA REPAIR N/A 04/17/2019   Procedure: HERNIA REPAIR UMBILICAL ADULT;  Surgeon: Olean Ree, MD;  Location: [facility] ORS;  Service: General;  Laterality: N/A;  . URETHROTOMY N/A 04/17/2019   Procedure: DIRECT VISION INTERNAL URETHROTOMY;  Surgeon: Abbie Sons, MD;  Location: [facility] ORS;  Service: Urology;  Laterality: N/A;    Prior to Admission medications   Medication Sig Start Date End Date Taking? Authorizing Provider  aspirin EC 81 MG tablet Take 1 tablet (81 mg total) by mouth daily. 02/03/16   Lada, Satira Anis, MD  cephALEXin (KEFLEX) 500 MG capsule Take 1 capsule (500 mg total) by mouth 2 (two) times daily for 7 days. 04/19/19 04/26/19  Olean Ree, MD  ibuprofen (ADVIL) 600 MG tablet Take 1 tablet (600 mg total) by mouth every 8 (eight) hours as needed for mild pain or moderate pain. 04/17/19   Piscoya, Jacqulyn Bath, MD  losartan-hydrochlorothiazide (HYZAAR) 100-25 MG tablet Take 1 tablet by mouth daily. 03/29/19   Towanda Malkin, MD  oxyCODONE (OXY IR/ROXICODONE) 5 MG immediate release tablet Take 1 tablet (5 mg total) by mouth every 4 (four) hours as needed for severe pain. 04/17/19   Olean Ree, MD  pravastatin (PRAVACHOL) 40 MG tablet Take 1 tablet (40 mg total) by mouth at bedtime. 02/22/19   Towanda Malkin, MD    Allergies  Patient has no known allergies.  Family History  Problem Relation Age of Onset  . Arthritis Mother   . Kidney disease Mother   . Heart disease Father   . Cancer Father        lung, colon  . Diabetes Brother   . Cancer Brother        stomach tumor  . Glaucoma Maternal Grandmother   . Kidney disease Paternal Grandmother   . Neuropathy Brother     Social History Social History   Tobacco Use  . Smoking status: Never  Smoker  . Smokeless tobacco: Never Used  Substance Use Topics  . Alcohol use: Yes    Alcohol/week: 0.0 standard drinks    Comment: occasional  . Drug use: No    Review of Systems Constitutional: No fever/chills Eyes: No visual changes. Cardiovascular: Denies chest pain. Respiratory: Denies shortness of breath. Gastrointestinal: No abdominal pain.  No nausea, no vomiting. Genitourinary: Positive for hematuria. Musculoskeletal: Negative for muscle aches. Skin: Negative for rash. Neurological: Negative for headaches ____________________________________________   PHYSICAL EXAM:  VITAL SIGNS: ED Triage Vitals  Enc Vitals Group     BP 04/20/19 0751 (!) 168/89     Pulse Rate 04/20/19 0751 71     Resp 04/20/19 0751 16     Temp 04/20/19 0751 97.8 F (36.6 C)     Temp Source 04/20/19 0751 Oral     SpO2 04/20/19 0751 97 %     Weight 04/20/19 0749 149 lb (67.6 kg)     Height 04/20/19 0749 5\' 3"  (1.6 m)     Head Circumference --      Peak Flow --      Pain Score 04/20/19 0748 4     Pain Loc --      Pain Edu? --      Excl. in [place]? --     Constitutional: Alert and oriented. Well appearing and in no acute distress. Eyes: Conjunctivae are normal. PERRL. EOMI. Head: Atraumatic. Neck: No stridor.   Cardiovascular: Normal rate, regular rhythm. Grossly normal heart sounds.  Good peripheral circulation. Respiratory: Normal respiratory effort.  No retractions. Lungs CTAB. Gastrointestinal: Soft and nontender. No distention.  Musculoskeletal: Moves upper and lower extremities with any difficulty and patient is ambulatory without any assistance. Neurologic:  Normal speech and language. No gross focal neurologic deficits are appreciated. No gait instability. Skin:  Skin is warm, dry and intact. No rash noted. Psychiatric: Mood and affect are normal. Speech and behavior are normal.  ____________________________________________   LABS (all labs ordered are listed, but only abnormal  results are displayed)  Labs Reviewed  URINALYSIS, COMPLETE (UACMP) WITH MICROSCOPIC - Abnormal; Notable for the following components:      Result Value   Color, Urine YELLOW (*)    APPearance CLEAR (*)    Hgb urine dipstick LARGE (*)    Leukocytes,Ua SMALL (*)    RBC / HPF >50 (*)    All other components within normal limits  URINE CULTURE    PROCEDURES  Procedure(s) performed (including Critical Care):  Procedures  ____________________________________________   INITIAL IMPRESSION / ASSESSMENT AND PLAN / ED COURSE  As part of my medical decision making, I reviewed the following data within the electronic MEDICAL RECORD NUMBER Notes from prior ED visits and [place] Controlled Substance Database  69 year old male presents to the ED with complaint of hematuria that was noticed this morning.  Patient had a Foley catheter removed yesterday after having had surgery.  In  looking through his records apparently Keflex 500 mg twice daily for 7 days was sent to the pharmacy yesterday and patient has taken 1 tablet so far since picking it up.  Patient states that the Keflex was initially called in because of his incision site was slightly red.  Patient denies any fever, chills, nausea or vomiting.  Urinalysis did show RBCs and WBCs but no bacteria.  A culture was ordered.  Patient will continue taking Keflex until the culture has been resulted at which time patient is aware that there may be a change in his antibiotic.  ____________________________________________   FINAL CLINICAL IMPRESSION(S) / ED DIAGNOSES  Final diagnoses:  Hematuria, unspecified type  Dysuria     ED Discharge Orders    None       Note:  This document was prepared using Dragon voice recognition software and may include unintentional dictation errors.    Johnn Hai, PA-C 04/20/19 [id number]    Merlyn Lot, MD 04/20/19 1320

## 2019-04-20 NOTE — Discharge Instructions (Signed)
Follow-up with your urologist as needed.  Continue the Keflex 5 mg twice daily until completely finished that was called in for you yesterday.  Increase fluids.  If any worsening of your symptoms such as fever, chills, nausea or vomiting return to the emergency department this weekend.

## 2019-04-21 LAB — URINE CULTURE
Culture: NO GROWTH
Special Requests: NORMAL

## 2019-04-22 ENCOUNTER — Telehealth: Payer: Self-pay

## 2019-04-22 NOTE — Telephone Encounter (Signed)
Attempted to reach patient to follow up after ED visit for hematuria. Patient voicemail is full. Will attempt again later.

## 2019-04-23 NOTE — Telephone Encounter (Signed)
Attempted to contact patient again today. Voicemail is not active.

## 2019-04-24 ENCOUNTER — Other Ambulatory Visit: Payer: Self-pay

## 2019-04-24 ENCOUNTER — Ambulatory Visit (INDEPENDENT_AMBULATORY_CARE_PROVIDER_SITE_OTHER): Payer: Self-pay | Admitting: Surgery

## 2019-04-24 ENCOUNTER — Encounter: Payer: Self-pay | Admitting: Surgery

## 2019-04-24 VITALS — BP 132/81 | HR 90 | Temp 97.7°F | Ht 64.0 in | Wt 155.6 lb

## 2019-04-24 DIAGNOSIS — Z09 Encounter for follow-up examination after completed treatment for conditions other than malignant neoplasm: Secondary | ICD-10-CM

## 2019-04-24 DIAGNOSIS — T8149XA Infection following a procedure, other surgical site, initial encounter: Secondary | ICD-10-CM

## 2019-04-24 NOTE — Progress Notes (Signed)
04/24/2019  HPI: Xavier [name] is a 69 y.o. male s/p umbilical hernia repair with mesh on 4/7.  He had a wound infection that was noted while at the Urology office on 4/9.  He was started on Keflex for a 7 day course.  He reports that he's doing well and after some initial pain, has improved.  The redness has also improved and denies any drainage from the wound.    Vital signs: BP 132/81   Pulse 90   Temp 97.7 F (36.5 C) (Temporal)   Ht 5\' 4"  (1.626 m)   Wt 155 lb 9.6 oz (70.6 kg)   SpO2 98%   BMI 26.71 kg/m    Physical Exam: Constitutional:  No acute distress Abdomen:  Soft, non-distended, currently non-tender to palpation.  Umbilical incision is clean, dry, intact.  There is mild residual erythema vs ecchymosis, but no overt signs of infection.  The umbilical area is more swollen/firm likely from inflammation and scar tissue.  Patient also has a diastasis recti in the upper abdomen.  No evidence of hernia recurrence.  Assessment/Plan: This is a 68 y.o. male s/p umbilical hernia repair with mesh, with post-op wound infection.  Discussed with the patient that the area is healing well and the infection seems to be resolving.  I do not feel there is any hernia recurrence at this time.  Reassured him that the firmness/swelling will continue to improve as the healing continues and the infection resolves.  Continue antibiotic until course completed.  Follow up in two weeks for wound check.   Melvyn Neth, [facility]

## 2019-04-24 NOTE — Patient Instructions (Addendum)
Dr.Piscoya recommended patient to contact our office if he does not notice any changes to the area of concern. Patient will follow up with Dr.Piscoya in two weeks.   Diastasis Recti  Diastasis recti is when the muscles of the abdomen (rectus abdominis muscles) become thin and separate. The result is a wider space between the right and left abdomen (abdominal) muscles. This wider space between the muscles may cause a bulge in the middle of your abdomen. You may notice this bulge when you are straining or when you sit up from a lying down position. Diastasis recti can affect men and women. It is most common among pregnant women, infants, people who are obese, and people who have had abdominal surgery. Exercise or surgical treatment may help correct it. What are the causes? Common causes of this condition include:  Pregnancy. The growing uterus puts pressure on the abdominal muscles, which causes the muscles to separate.  Obesity. Excess fat puts pressure on abdominal muscles.  Weightlifting.  Some abdomen exercises.  Advanced age.  Genetics.  Prior abdominal surgery. What increases the risk? This condition is more likely to develop in:  Women.  Newborns, especially newborns who are born early (prematurely). What are the signs or symptoms? Common symptoms of this condition include:  A bulge in the middle of the abdomen. You will notice it most when you sit up or strain.  Pain in the low back, pelvis, or hips.  Constipation.  Inability to control when you urinate (urinary incontinence).  Bloating.  Poor posture. How is this diagnosed? This condition is diagnosed with a physical exam. Your health care provider will ask you to lie flat on your back and do a crunch or half sit-up. If you have diastasis recti, a vertical bulge will appear between your abdominal muscles in the center of your abdomen. Your health care provider will measure the gap between your muscles with one of the  following:  A medical device used to measure the space between two objects (caliper).  A tape measure.  CT scan.  Ultrasound.  Finger spaces. Your health care provider will measure the space using their fingers. How is this treated? If your muscle separation is not too large, you may not need treatment. However, if you are a woman who plans to become pregnant again, you should treat this condition before your next pregnancy. Treatment may include:  Physical therapy to strengthen and tighten your abdominal muscles.  Lifestyle changes such as weight loss and exercise.  Over-the-counter pain medicines as needed.  Surgery to correct the separation. Follow these instructions at home: Activity  Return to your normal activities as told by your health care provider. Ask your health care provider what activities are safe for you.  When lifting weights or doing exercises using your abdominal muscles or the muscles in the center of your body that give stability (core muscles), make sure you are doing your exercises and movements correctly. Proper form can help to prevent the condition from happening again. General instructions  If you are overweight, ask your health care provider for help with weight loss. Losing even a small amount of weight can help to improve your diastasis recti.  Take over-the-counter or prescription medicines only as told by your health care provider.  Do not strain. Straining can make the separation worse. Examples of straining include: ? Pushing hard to have a bowel movement, such as due to constipation. ? Lifting heavy objects, including children. ? Standing up and sitting down.  Take steps to prevent constipation: ? Drink enough fluid to keep your urine clear or pale yellow. ? Take over-the-counter or prescription medicines only as directed. ? Eat foods that are high in fiber, such as fresh fruits and vegetables, whole grains, and beans. ? Limit foods that  are high in fat and processed sugars, such as fried and sweet foods. Contact a health care provider if:  You notice a new bulge in your abdomen. Get help right away if:  You experience severe discomfort in your abdomen.  You develop severe abdominal pain along with nausea, vomiting, or fever. Summary  Diastasis recti is when the abdomen (abdominal) muscles become thin and separate. Your abdomen will stick out because the space between your right and left abdomen muscles has widened.  The most common symptom is a bulge in your abdomen. You will notice it most when you sit up or are straining.  This condition is diagnosed during a physical exam.  If the abdomen separation is not too big, you may choose not to have treatment. Otherwise, you may need to undergo physical therapy or surgery. This information is not intended to replace advice given to you by your health care provider. Make sure you discuss any questions you have with your health care provider. Document Revised: 12/09/2016 Document Reviewed: 02/22/2016 Elsevier Patient Education  2020 [employer].

## 2019-04-30 ENCOUNTER — Encounter: Payer: Medicare HMO | Admitting: Physician Assistant

## 2019-05-08 ENCOUNTER — Encounter: Payer: Self-pay | Admitting: Surgery

## 2019-05-08 ENCOUNTER — Other Ambulatory Visit: Payer: Self-pay

## 2019-05-08 ENCOUNTER — Ambulatory Visit (INDEPENDENT_AMBULATORY_CARE_PROVIDER_SITE_OTHER): Payer: Self-pay | Admitting: Surgery

## 2019-05-08 VITALS — BP 128/81 | HR 87 | Temp 97.9°F | Resp 15 | Ht 64.0 in | Wt 157.0 lb

## 2019-05-08 DIAGNOSIS — K429 Umbilical hernia without obstruction or gangrene: Secondary | ICD-10-CM

## 2019-05-08 DIAGNOSIS — T8149XA Infection following a procedure, other surgical site, initial encounter: Secondary | ICD-10-CM

## 2019-05-08 DIAGNOSIS — Z09 Encounter for follow-up examination after completed treatment for conditions other than malignant neoplasm: Secondary | ICD-10-CM

## 2019-05-08 NOTE — Patient Instructions (Addendum)
Follow-up with our office as needed.  Please call and ask to speak with a nurse if you develop questions or concerns.   GENERAL POST-OPERATIVE PATIENT INSTRUCTIONS   WOUND CARE INSTRUCTIONS:  If clothing rubs against the wound or causes irritation and the wound is not draining you may cover it with a dry dressing during the daytime.  Try to keep the wound dry and avoid ointments on the wound unless directed to do so.  If the wound becomes bright red and painful or starts to drain infected material that is not clear, please contact your physician immediately.  If the wound is mildly pink and has a thick firm ridge underneath it, this is normal, and is referred to as a healing ridge.  This will resolve over the next 4-6 weeks.  BATHING: You may shower if you have been informed of this by your surgeon. However, Please do not submerge in a tub, hot tub, or pool until incisions are completely sealed or have been told by your surgeon that you may do so.  DIET:  You may eat any foods that you can tolerate.  It is a good idea to eat a high fiber diet and take in plenty of fluids to prevent constipation.  If you do become constipated you may want to take a mild laxative or take ducolax tablets on a daily basis until your bowel habits are regular.  Constipation can be very uncomfortable, along with straining, after recent surgery.  ACTIVITY:  You are encouraged to walk and engage in light activity for the next two weeks.  You should not lift more than 20 pounds for 6 weeks after surgery as it could put you at increased risk for complications.  Twenty pounds is roughly equivalent to a plastic bag of groceries. At that time- Listen to your body when lifting, if you have pain when lifting, stop and then try again in a few days. Soreness after doing exercises or activities of daily living is normal as you get back in to your normal routine.  MEDICATIONS:  Try to take narcotic medications and anti-inflammatory  medications, such as tylenol, ibuprofen, naprosyn, etc., with food.  This will minimize stomach upset from the medication.  Should you develop nausea and vomiting from the pain medication, or develop a rash, please discontinue the medication and contact your physician.  You should not drive, make important decisions, or operate machinery when taking narcotic pain medication.  SUNBLOCK Use sun block to incision area over the next year if this area will be exposed to sun. This helps decrease scarring and will allow you avoid a permanent darkened area over your incision.  QUESTIONS:  Please feel free to call our office if you have any questions, and we will be glad to assist you.

## 2019-05-08 NOTE — Progress Notes (Signed)
05/08/2019  HPI: Xavier [name] is a 69 y.o. male s/p umbilical hernia repair with mesh, with post-op superficial wound infection, treated with Keflex.  Presents for follow up.  Denies any abdominal pain, and reports the redness has resolved.  Denies any other issues except one bout of constipation  Vital signs: BP 128/81   Pulse 87   Temp 97.9 F (36.6 C)   Resp 15   Ht 5\' 4"  (1.626 m)   Wt 157 lb (71.2 kg)   SpO2 97%   BMI 26.95 kg/m    Physical Exam: Constitutional:  No acute distress Abdomen:  Soft, non-distended, non-tender to palpation.  Incision is clean, dry, intact, without any evidence of infection.  The umbilical stalk appears detached and the umbilicus is flat rather than sunken in.  Assessment/Plan: This is a 69 y.o. male s/p umbilical hernia repair with mesh, with post-op superficial infection.  --Patient is healing well, without any further evidence of infection.  His umbilical stalk may have detached as part of the infection, but at this point, there is no hernia recurrence and the mesh appears intact.   --Recommended that he wait to 6 weeks after surgery to resume normal activities given the infection so things have a longer time to heal better. --Follow up prn.   Melvyn Neth, [facility]

## 2019-05-09 NOTE — Progress Notes (Signed)
Patient ID: Temur Schu, male    DOB: April 10, 1950, 69 y.o.   MRN: [id number]  PCP: Hubbard Hartshorn, FNP  Chief Complaint  Patient presents with  . Follow-up    f/u on UTI's  . Hypertension  . Hyperlipidemia    Subjective:   Le Clougherty is a 69 y.o. male, presents to clinic with CC of the following:  Chief Complaint  Patient presents with  . Follow-up    f/u on UTI's  . Hypertension  . Hyperlipidemia    HPI:  Patient is a 69 year old male who I last saw 03/22/2019 Returns today for follow-up  After our last visit, he was contacted with concerns for a UTI and Cipro initiated. He did subsequently have follow-up with urology with their assessment and recommendations as follows:  In summary, he is a 69 year old male with longstanding urinary symptoms of weak stream, incomplete emptying, significantly elevated PVR 450 mL in clinic today, recurrent UTIs, history of urinary retention requiring Foley catheter, and single  isolated elevated PSA of 8.3 at the time of an acute UTI last week.  We had a long conversation about BPH and incomplete bladder emptying as the likely etiology of his urinary symptoms, elevated PVR, recurrent UTIs, as well as the likely  relationship of acute UTI causing an elevation in his PSA last week.  I recommended further evaluation of his BPH with a transrectal ultrasound to evaluate prostate volume as well as a cystoscopy to see if he is a candidate for an outlet procedure.   RTC for transrectal ultrasound and cystoscopy for HoLEP planning  Continue Cipro for 2-week course total for acute UTI He returned to have the procedure on 04/11/2019 The findings were as follows:  We discussed findings of a pinpoint bulbar urethral stricture today in clinic, as well as treatment options including cystoscopy with balloon dilation and direct vision internal urethrotomy versus urethroplasty.  We discussed first-line is typically balloon dilation, but  there can be a high recurrence rate and we need to follow him closely to detect possible recurrence.  We discussed the risks of bleeding, infection, and recurrence at length.  This procedure takes about 10 to 15 minutes and is done under general anesthesia.   He is having a umbilical hernia repair with Dr. Hampton Abbot on Wednesday 4/7, and we will coordinate to have these procedures done at the same time.  I discussed his case with Dr. Hampton Abbot, as well as my partner Dr. Bernardo Heater who will be performing the procedure next week.   -Schedule cystoscopy/balloon dilation of urethral stricture/DVIU next week to coordinate with general surgery repair of umbilical hernia  -Will need follow-up PSA to confirm decreased after mild elevation during acute UTI  He followed up with Dr. Ancil Boozer on 04/16/2019 for preoperative clearance for his surgeries, with some confusion as to not stopping his blood pressure medicine before the surgery noted. He had the procedures done 04/17/2019, return to urology on 4/9 to have his catheter removed and return to the emergency room 4/10 for hematuria, felt likely related to the recent procedure.  A concern for a wound infection was noted during that follow-up urology visit, and Keflex was started to manage on his most recent follow-up 05/08/2019, it was noted that he was healing well, with no further concerns for infection.  Today, he notes the wound area remains improved, with no increased pain or redness surrounding.  He notes his urination has significantly improved, with still just  a slight burning when he first starts, and then goes away, and notes it is "a whole lot better".  He denies any blood in his urine.  No fevers, no flank pains.  On review, the urinalysis from 04/20/2019 showed blood with the culture returning as no growth.   HTN - Medication regimen -losartan/hydrochlorothiazide combination (Norvasc-5 mg was added last visit, although he was not taking before the procedures,  and is not taking presently, will continue off of the Norvasc presently) Taking medications regularly Does not check BP's at home  BP Readings from Last 3 Encounters:  05/10/19 118/74  05/08/19 128/81  04/24/19 132/81   No chest pains, palp's, + mild SOB when tries to do too much noted prior, and today noted is better,no orthopnea, no PND, has poor circulation in legsand hasswelling at times, has varicose veins, worse on the left. Has been more chronic and he notes that has not significantly increased. No increased headaches, no marked vision changes-although he noted he still needs to go get eyes checked and encouraged  Hyperlipidemia - Medication regimen - taking the statin again.  No myalgias Lab Results  Component Value Date   CHOL 156 03/29/2019   HDL 65 03/29/2019   LDLCALC 77 03/29/2019   TRIG 49 03/29/2019   CHOLHDL 2.4 03/29/2019    Prediabetesnotedin the past; 4+years ago, most recentA1c's ok,noFH ofdiabetes; No increased thirst, no numbness or tingling in the extremities.  Lab Results  Component Value Date   HGBA1C 5.2 03/29/2019    Mild anemiain past noted; no blood in the stool in recent past, no black stools.  His iron panel checked after last visit was normal. Not have colonoscopy in his past.Did cologuard and was ok about 3 years ago reported.   Discussed that is often repeated in a 3 to 5-year interval for screening purposes.  Denies marked fatigue and is feeling better after the above procedures.   Lab Results  Component Value Date   WBC 6.8 03/29/2019   HGB 12.7 (L) 03/29/2019   HCT 37.7 (L) 03/29/2019   MCV 94.7 03/29/2019   PLT 255 03/29/2019    LUTS - currently seeing urology again with recent procedures as noted above PSA was elevated again in the recent past, with urology involved presently (question infectious contribution to this increase recently)       Lab Results  Component Value Date   PSA1 4.0 07/21/2014   PSA  8.3 (H) 03/29/2019   PSA 3.3 09/19/2017   PSA 4.3 (H) 02/16/2017    FH - Notedfather had prostate issues too, he had prostate or colon cancerand not  sure which was primary as one spread to the other, went to the lungs, Died in his 3's,  diagnosed late [id number].   umbilical hernia - recently repaired and doing well after a superficial infection noted postop  left carotid bruit; patientdid not havethe carotid US done that was rec'ed prior and another ordered on our last visit and not obtained.  +hx of stroke 2013; on aspirin and statin.  Notes he has some subtle left-sided weakness after the stroke, mainly in the upper extremity and not limiting.  No recent increased symptoms.   Tob -No smoking hx, exposures to a lot of chemicals and board dust with work for 30 years, worked at [employer]. Alcohol - occas Staying active presently    Patient Active Problem List   Diagnosis Date Noted  . Hemiparesis affecting left side as late effect of cerebrovascular accident (  [place]) 04/16/2019  . Prediabetes 03/29/2019  . Lower urinary tract symptoms (LUTS) 02/22/2019  . Impingement syndrome of shoulder, left 09/28/2017  . Hearing loss 02/16/2017  . Umbilical hernia without obstruction and without gangrene 06/21/2016  . Medication monitoring encounter 02/23/2016  . Hyperglycemia 02/04/2016  . Left carotid bruit 02/03/2016  . Arthritis of both hands 03/10/2015  . Spider varicose veins 03/10/2015  . Low serum thyroid stimulating hormone (TSH) 07/22/2014  . Hyperlipidemia LDL goal <70 07/21/2014  . Hypertension goal BP (blood pressure) < 140/90 07/21/2014  . Peripheral arterial disease ([place]) 07/21/2014      Current Outpatient Medications:  .  aspirin EC 81 MG tablet, Take 1 tablet (81 mg total) by mouth daily., Disp: , Rfl:  .  ibuprofen (ADVIL) 600 MG tablet, Take 1 tablet (600 mg total) by mouth every 8 (eight) hours as needed for mild pain or moderate pain., Disp: 30 tablet, Rfl: 1 .   losartan-hydrochlorothiazide (HYZAAR) 100-25 MG tablet, Take 1 tablet by mouth daily., Disp: 90 tablet, Rfl: 3 .  pravastatin (PRAVACHOL) 40 MG tablet, Take 1 tablet (40 mg total) by mouth at bedtime., Disp: 90 tablet, Rfl: 3   No Known Allergies   Past Surgical History:  Procedure Laterality Date  . CYSTOSCOPY WITH RETROGRADE URETHROGRAM N/A 04/17/2019   Procedure: CYSTOSCOPY WITH BALLOON DILATION OF URETHRAL STRICTURE;  Surgeon: Abbie Sons, MD;  Location: [facility] ORS;  Service: Urology;  Laterality: N/A;  . TONSILLECTOMY  1958  . TONSILLECTOMY    . UMBILICAL HERNIA REPAIR N/A 04/17/2019   Procedure: HERNIA REPAIR UMBILICAL ADULT;  Surgeon: Olean Ree, MD;  Location: [facility] ORS;  Service: General;  Laterality: N/A;  . URETHROTOMY N/A 04/17/2019   Procedure: DIRECT VISION INTERNAL URETHROTOMY;  Surgeon: Abbie Sons, MD;  Location: [facility] ORS;  Service: Urology;  Laterality: N/A;     Family History  Problem Relation Age of Onset  . Arthritis Mother   . Kidney disease Mother   . Heart disease Father   . Cancer Father        lung, colon  . Diabetes Brother   . Cancer Brother        stomach tumor  . Glaucoma Maternal Grandmother   . Kidney disease Paternal Grandmother   . Neuropathy Brother      Social History   Tobacco Use  . Smoking status: Never Smoker  . Smokeless tobacco: Never Used  Substance Use Topics  . Alcohol use: Yes    Alcohol/week: 0.0 standard drinks    Comment: occasional    With staff assistance, above reviewed with the patient today.  ROS: As per HPI, otherwise no specific complaints on a limited and focused system review   No results found for this or any previous visit (from the past 72 hour(s)).   PHQ2/9: Depression screen [facility] 2/9 05/10/2019 04/16/2019 03/29/2019 02/22/2019 09/19/2017  Decreased Interest 0 0 0 0 0  Down, Depressed, Hopeless 1 0 1 1 1   PHQ - 2 Score 1 0 1 1 1   Altered sleeping 2 0 1 1 1   Tired, decreased energy 0 0 0 0 0    Change in appetite 0 0 0 0 0  Feeling bad or failure about yourself  0 0 0 1 0  Trouble concentrating 1 0 0 0 0  Moving slowly or fidgety/restless 0 0 0 0 0  Suicidal thoughts 0 0 0 0 0  PHQ-9 Score 4 0 2 3 2   Difficult doing work/chores Not  difficult at all - Not difficult at all Not difficult at all Not difficult at all   PHQ-2/9 Result reviewed   Fall Risk: Fall Risk  05/10/2019 04/24/2019 04/16/2019 04/01/2019 03/29/2019  Falls in the past year? 0 0 0 0 1  Number falls in past yr: 0 0 0 - 0  Injury with Fall? 0 0 0 - 0  Comment - - - - -      Objective:   Vitals:   05/10/19 0901  BP: 118/74  Pulse: 93  Resp: 16  Temp: 97.7 F (36.5 C)  TempSrc: Temporal  SpO2: 100%  Weight: 157 lb 9.6 oz (71.5 kg)  Height: 5\' 3"  (1.6 m)    Body mass index is 27.92 kg/m.  Physical Exam   NAD, masked, pleasant HEENT - [place]/AT, sclera anicteric, PERRL, EOMI, conj - non-inj'ed, pharynx clear Neck - supple, no adenopathy, carotids 2+, still question a very subtle left carotid bruit Car - RRR without m/g/r Pulm- RR and effort normal at rest, CTA without wheeze or rales Abd - soft, soreness more than any marked tenderness to palpation (notes still a little soreness where the mesh is), wound site healing nicely with no surrounding erythema, ND, BS+,  no masses Back - no CVA tenderness Skin- no rash noted, no new lesions of concern noted on exposed areas, patient denied otherwise Ext - 1+ LE edema bilateral with impression at sock line noted when socks pulled down, positive varicosities scattered in the calf regions bilaterally, no marked tenderness palpating today, no erythema or increased warmth of the calf muscles, no cords identified Neuro/psychiatric - affect was not flat, appropriate with conversation             Alert and oriented             good strength on testing extremities, with a subtle deficit on the left versus the right and strength testing the upper extremity, Sensation intact  to LT in distal extremities, a very subtle resting tremor noted of the right upper extremity persists, not marked             Speech  normal   Results for orders placed or performed during the hospital encounter of 04/20/19  Urine culture   Specimen: Urine, Clean Catch  Result Value Ref Range   Specimen Description      URINE, CLEAN CATCH Performed at [facility], [address] [place], [place] [address]    Special Requests      Normal Performed at [facility], [address], [place], [place] [address]    Culture      NO GROWTH Performed at [facility], [place] [address], [place],  [address]    Report Status 04/21/2019 FINAL   Urinalysis, Complete w Microscopic  Result Value Ref Range   Color, Urine YELLOW (A) YELLOW   APPearance CLEAR (A) CLEAR   Specific Gravity, Urine 1.011 1.005 - 1.030   pH 6.0 5.0 - 8.0   Glucose, UA NEGATIVE NEGATIVE mg/dL   Hgb urine dipstick LARGE (A) NEGATIVE   Bilirubin Urine NEGATIVE NEGATIVE   Ketones, ur NEGATIVE NEGATIVE mg/dL   Protein, ur NEGATIVE NEGATIVE mg/dL   Nitrite NEGATIVE NEGATIVE   Leukocytes,Ua SMALL (A) NEGATIVE   RBC / HPF >50 (H) 0 - 5 RBC/hpf   WBC, UA 21-50 0 - 5 WBC/hpf   Bacteria, UA NONE SEEN NONE SEEN   Squamous Epithelial / LPF 0-5 0 - 5  Assessment & Plan:   1. Hypertension goal BP (blood pressure) < 140/90 Blood pressure remains well controlled on the Hyzaar product.  Will remain off of the amlodipine. Continue to monitor.  2. Hyperlipidemia LDL goal <70 Recent lipid panel reviewed Continue the statin product Continue with dietary modifications to help.  3. Prediabetes - h/o Last A1c was good, not consistent with prediabetes, although noted a history of this. Continue to monitor.  4. Mild anemia His iron panel was normal last visit, and his counts remain stable. Continuing to monitor. Discussed colon cancer screening again today, and if he prefers to stay with  Cologuard, will repeat again in the near future.  Reassured there is no evidence of iron deficiency and also no concerning recent symptoms.  5. Spider varicose veins Does have mild swelling in his lower extremities, probably from the varicosities.  Discussed if the swelling is more problematic or the pain is increasing, referring to vascular, as there are some other modalities and recommendations to help further manage this.  6. Hemiparesis affecting left side as late effect of cerebrovascular accident [facility])  some subtle left upper extremity weakness after the prior stroke persists, although no major deficit.  Because of the left bruit, even though subtle, do feel checking a carotid ultrasound is needed and that was ordered last visit and not obtained and will reorder today. Continue the aspirin product presently - VAS US CAROTID; Future  7. Lower urinary tract symptoms (LUTS) Continue to follow-up with urology after the procedure that was done, with a follow-up visit planned in May.  Emphasized the importance for him and keeping this follow-up visit.  8. Increased prostate specific antigen (PSA) velocity Continue follow-up with urology.  Plans to recheck a PSA to help reassess  9. Umbilical hernia without obstruction and without gangrene - s/p repair Status post repair, with a superficial infection noted postop and that improved presently.  10. Left carotid bruit - VAS US CAROTID; Future         Towanda Malkin, MD 05/10/19 9:14 AM

## 2019-05-10 ENCOUNTER — Ambulatory Visit (INDEPENDENT_AMBULATORY_CARE_PROVIDER_SITE_OTHER): Payer: Medicare HMO | Admitting: Internal Medicine

## 2019-05-10 ENCOUNTER — Encounter: Payer: Self-pay | Admitting: Internal Medicine

## 2019-05-10 ENCOUNTER — Other Ambulatory Visit: Payer: Self-pay

## 2019-05-10 VITALS — BP 118/74 | HR 93 | Temp 97.7°F | Resp 16 | Ht 63.0 in | Wt 157.6 lb

## 2019-05-10 DIAGNOSIS — R972 Elevated prostate specific antigen [PSA]: Secondary | ICD-10-CM | POA: Diagnosis not present

## 2019-05-10 DIAGNOSIS — R0989 Other specified symptoms and signs involving the circulatory and respiratory systems: Secondary | ICD-10-CM

## 2019-05-10 DIAGNOSIS — R399 Unspecified symptoms and signs involving the genitourinary system: Secondary | ICD-10-CM | POA: Diagnosis not present

## 2019-05-10 DIAGNOSIS — D649 Anemia, unspecified: Secondary | ICD-10-CM | POA: Diagnosis not present

## 2019-05-10 DIAGNOSIS — K429 Umbilical hernia without obstruction or gangrene: Secondary | ICD-10-CM | POA: Diagnosis not present

## 2019-05-10 DIAGNOSIS — I10 Essential (primary) hypertension: Secondary | ICD-10-CM

## 2019-05-10 DIAGNOSIS — I69354 Hemiplegia and hemiparesis following cerebral infarction affecting left non-dominant side: Secondary | ICD-10-CM

## 2019-05-10 DIAGNOSIS — I868 Varicose veins of other specified sites: Secondary | ICD-10-CM | POA: Diagnosis not present

## 2019-05-10 DIAGNOSIS — E785 Hyperlipidemia, unspecified: Secondary | ICD-10-CM

## 2019-05-10 DIAGNOSIS — R7303 Prediabetes: Secondary | ICD-10-CM | POA: Diagnosis not present

## 2019-06-05 ENCOUNTER — Encounter: Payer: Self-pay | Admitting: Urology

## 2019-06-05 ENCOUNTER — Ambulatory Visit: Payer: Medicare HMO | Admitting: Urology

## 2019-09-09 NOTE — Progress Notes (Deleted)
Patient is a 69 year old male Last visit with me was in April 2021 Follows up today.  On follow-up today,     HTN - Medication regimen -losartan/hydrochlorothiazide combination Taking medications regularly Does not check BP's at home  BP Readings from Last 3 Encounters:  05/10/19 118/74  05/08/19 128/81  04/24/19 132/81    No chest pains, palp's, + mild SOB when tries to do too much noted prior, and today noted is better,noorthopnea, noPND, has poor circulation in legsand hasswelling at times, has varicose veins, worse on the left. Has been more chronic and he notes that has not significantly increased. No increased headaches, no marked vision changes-although he noted he still needs to go get eyes checked and encouraged  Hyperlipidemia - Medication regimen - taking the statin again.  No myalgias Lab Results  Component Value Date   CHOL 156 03/29/2019   HDL 65 03/29/2019   LDLCALC 77 03/29/2019   TRIG 49 03/29/2019   CHOLHDL 2.4 03/29/2019     Prediabetesnotedin the past; 4+years ago, most recentA1c's ok,noFH ofdiabetes; No increased thirst, no numbness or tingling in the extremities.  Lab Results  Component Value Date   HGBA1C 5.2 03/29/2019   HGBA1C 5.3 02/16/2017   HGBA1C 5.5 02/23/2016   Lab Results  Component Value Date   LDLCALC 77 03/29/2019   CREATININE 1.23 03/29/2019     Mild anemiain past noted; no blood in the stool in recent past, no black stools.  Recent iron panel checked was normal. Not have colonoscopy in his past.Did cologuard and was ok about 3 years ago reported.         Lab Results  Component Value Date   WBC 6.8 03/29/2019   HGB 12.7 (L) 03/29/2019   HCT 37.7 (L) 03/29/2019   MCV 94.7 03/29/2019   PLT 255 03/29/2019    LUTS - currently seeing urology again with recent procedures as noted above PSA was elevated again in the recent past, with urology involved presently (question infectious  contribution to this increase recently)            Lab Results  Component Value Date   PSA1 4.0 07/21/2014   PSA 8.3 (H) 03/29/2019   PSA 3.3 09/19/2017   PSA 4.3 (H) 02/16/2017               FH -Notedfather had prostate issues too, he had prostate or colon cancerand not      sure which was primary as one spread to the other, went to the lungs, Died in his 29's,    diagnosed late 63'I.  umbilical hernia - recently repaired and doing well after a superficial infection noted postop  left carotid bruit; patientdid not havethe carotid US done that was rec'ed prior and another ordered on our last visit and not obtained.  +hx of stroke2013; on aspirin and statin.Notes he has some subtle left-sided weakness after the stroke, mainly in the upper extremity and not limiting.  No recent increased symptoms.   Tob -No smoking hx, exposures to a lot of chemicals and board dust with work for 30 years, worked at [employer]. Alcohol -occas Staying active presently

## 2019-09-10 ENCOUNTER — Ambulatory Visit: Payer: Medicare HMO | Admitting: Internal Medicine

## 2019-09-23 ENCOUNTER — Telehealth: Payer: Self-pay | Admitting: *Deleted

## 2019-09-23 NOTE — Chronic Care Management (AMB) (Signed)
  Chronic Care Management   Outreach Note  09/23/2019 Name: Xavier [name] MRN: [id number] DOB: January 08, 1951  Xavier [name] is a 69 y.o. year old male who is a primary care patient of Towanda Malkin, MD. I reached out to Xavier [name] by phone today in response to a referral sent by Mr. Xavier [name]'s health plan.     An unsuccessful telephone outreach was attempted today. The patient was referred to the case management team for assistance with care management and care coordination.   Follow Up Plan: A HIPAA compliant phone message was left for the patient providing contact information and requesting a return call. The care management team will reach out to the patient again over the next 7 days. If patient returns call to provider office, please advise to call [facility] at [phone].   [facility] Management

## 2019-09-30 NOTE — Chronic Care Management (AMB) (Signed)
  Chronic Care Management   Outreach Note  09/30/2019 Name: Xavier [name] MRN: [id number] DOB: 30-Apr-1950  Xavier [name] is a 69 y.o. year old male who is a primary care patient of Towanda Malkin, MD. I reached out to [name] by phone today in response to a referral sent by [name]'s health plan.     A second unsuccessful telephone outreach was attempted today. The patient was referred to the case management team for assistance with care management and care coordination.   Follow Up Plan: A HIPAA compliant phone message was left for the patient providing contact information and requesting a return call. The care management team will reach out to the patient again over the next 7 days. If patient returns call to provider office, please advise to call [facility] at [phone].  [facility] Management

## 2019-10-07 NOTE — Chronic Care Management (AMB) (Signed)
  Chronic Care Management   Outreach Note  10/07/2019 Name: Xavier [name] MRN: [id number] DOB: 1950-07-26  Xavier [name] is a 69 y.o. year old male who is a primary care patient of Towanda Malkin, MD. I reached out to [name] by phone today in response to a referral sent by [name]'s health plan.     Third unsuccessful telephone outreach was attempted today. The patient was referred to the case management team for assistance with care management and care coordination. The patient's primary care provider has been notified of our unsuccessful attempts to make or maintain contact with the patient. The care management team is pleased to engage with this patient at any time in the future should he/she be interested in assistance from the care management team.   Follow Up Plan: The care management team is available to follow up with the patient after provider conversation with the patient regarding recommendation for care management engagement and subsequent re-referral to the care management team.   [facility] Management

## 2019-10-14 NOTE — Progress Notes (Signed)
Pt not reached by staff today for virtual OV No visit completed by provider No show

## 2019-10-15 ENCOUNTER — Telehealth (INDEPENDENT_AMBULATORY_CARE_PROVIDER_SITE_OTHER): Payer: Medicare HMO | Admitting: Family Medicine

## 2019-10-15 DIAGNOSIS — Z5329 Procedure and treatment not carried out because of patient's decision for other reasons: Secondary | ICD-10-CM

## 2019-10-15 DIAGNOSIS — Z91199 Patient's noncompliance with other medical treatment and regimen due to unspecified reason: Secondary | ICD-10-CM

## 2019-11-05 ENCOUNTER — Other Ambulatory Visit: Payer: Self-pay | Admitting: Internal Medicine

## 2019-11-05 DIAGNOSIS — I10 Essential (primary) hypertension: Secondary | ICD-10-CM

## 2019-11-05 MED ORDER — PRAVASTATIN SODIUM 40 MG PO TABS: 40.0000 mg | ORAL_TABLET | Freq: Every day | ORAL | 3 refills | Status: DC

## 2019-11-05 MED ORDER — LOSARTAN POTASSIUM-HCTZ 100-25 MG PO TABS: 1.0000 | ORAL_TABLET | Freq: Every day | ORAL | 3 refills | Status: DC
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
## 2019-11-18 ENCOUNTER — Telehealth: Payer: Self-pay

## 2019-11-18 NOTE — Telephone Encounter (Signed)
Why wouldn't he see him in person if that is what the patient wants. Is he having any covid symptoms. If not please call and schedule patient in office.

## 2019-11-18 NOTE — Progress Notes (Signed)
Patient ID: Xavier [name], male    DOB: 04-03-1950, 69 y.o.   MRN: [id number]  PCP: Towanda Malkin, MD  Chief Complaint  Patient presents with  . Headache  . Ear Pain    Subjective:   Xavier [name] is a 69 y.o. male, presents to clinic with CC of the following:  Chief Complaint  Patient presents with  . Headache  . Ear Pain    HPI:  Patient is a 69 year old male Last visit with me was in April 2021 He Was a No-Show for a Recent Visit with Delsa Grana in October Follows up today with headache and earache   Sx's started about 3 weeks ago.  He notes he feels discomfort deeper in the right ear, and feels it down the jaw all at times towards the back teeth on the right.  That component feels like a toothache, and he does have a significantly chipped tooth on the bottom right.  Has not seen a dentist in the recent past, as he has why he would need to with very limited dentition.  He has tried to use a Q-tip 4-5 times in the past week or 2, getting some yellowish type wax debris out of the right ear.  No blood.  Denies fevers, postnasal drip or sinus congestion, no increased mucus, no sore throats.  No loss of taste or smell.  Comorbid conditions reviewed No asthma/COPD hx,  No h/o DM, CKD, morbid obesity + h/o PAD, CVA, HTN   Patient Active Problem List   Diagnosis Date Noted  . Mild anemia 05/10/2019  . Hemiparesis affecting left side as late effect of cerebrovascular accident ([place]) 04/16/2019  . Prediabetes 03/29/2019  . Increased prostate specific antigen (PSA) velocity 03/29/2019  . Lower urinary tract symptoms (LUTS) 02/22/2019  . Impingement syndrome of shoulder, left 09/28/2017  . Hearing loss 02/16/2017  . Umbilical hernia without obstruction and without gangrene 06/21/2016  . Medication monitoring encounter 02/23/2016  . Hyperglycemia 02/04/2016  . Left carotid bruit 02/03/2016  . Arthritis of both hands 03/10/2015  . Spider varicose  veins 03/10/2015  . Low serum thyroid stimulating hormone (TSH) 07/22/2014  . Hyperlipidemia LDL goal <70 07/21/2014  . Hypertension goal BP (blood pressure) < 140/90 07/21/2014  . Peripheral arterial disease ([place]) 07/21/2014      Current Outpatient Medications:  .  aspirin EC 81 MG tablet, Take 1 tablet (81 mg total) by mouth daily., Disp: , Rfl:  .  ibuprofen (ADVIL) 600 MG tablet, Take 1 tablet (600 mg total) by mouth every 8 (eight) hours as needed for mild pain or moderate pain., Disp: 30 tablet, Rfl: 1 .  losartan-hydrochlorothiazide (HYZAAR) 100-25 MG tablet, Take 1 tablet by mouth daily., Disp: 90 tablet, Rfl: 3 .  pravastatin (PRAVACHOL) 40 MG tablet, Take 1 tablet (40 mg total) by mouth at bedtime., Disp: 90 tablet, Rfl: 3 .  NEOMYCIN-POLYMYXIN-HYDROCORTISONE (CORTISPORIN) 1 % SOLN OTIC solution, Place 3 drops into the right ear 4 (four) times daily., Disp: 10 mL, Rfl: 0   No Known Allergies   Past Surgical History:  Procedure Laterality Date  . CYSTOSCOPY WITH RETROGRADE URETHROGRAM N/A 04/17/2019   Procedure: CYSTOSCOPY WITH BALLOON DILATION OF URETHRAL STRICTURE;  Surgeon: Abbie Sons, MD;  Location: [facility];  Service: Urology;  Laterality: N/A;  . TONSILLECTOMY  1958  . TONSILLECTOMY    . UMBILICAL HERNIA REPAIR N/A 04/17/2019   Procedure: HERNIA REPAIR UMBILICAL ADULT;  Surgeon: Olean Ree,  MD;  Location: [facility];  Service: General;  Laterality: N/A;  . URETHROTOMY N/A 04/17/2019   Procedure: DIRECT VISION INTERNAL URETHROTOMY;  Surgeon: Abbie Sons, MD;  Location: [facility] OR[facility];  Service: Urology;  Laterality: N/A;     Family History  Problem Relation Age of Onset  . Arthritis Mother   . Kidney disease Mother   . Heart disease Father   . Cancer Father        lung, colon  . Diabetes Brother   . Cancer Brother        stomach tumor  . Glaucoma Maternal Grandmother   . Kidney disease Paternal Grandmother   . Neuropathy Brother      Social History    Tobacco Use  . Smoking status: Never Smoker  . Smokeless tobacco: Never Used  Substance Use Topics  . Alcohol use: Yes    Alcohol/week: 0.0 standard drinks    Comment: occasional    With staff assistance, above reviewed with the patient today.  ROS: As per HPI, otherwise no specific complaints on a limited and focused system review   No results found for this or any previous visit (from the past 72 hour(s)).   PHQ2/9: Depression screen [facility] 2/9 11/19/2019 05/10/2019 04/16/2019 03/29/2019 02/22/2019  Decreased Interest 0 0 0 0 0  Down, Depressed, Hopeless 0 1 0 1 1  PHQ - 2 Score 0 1 0 1 1  Altered sleeping - 2 0 1 1  Tired, decreased energy - 0 0 0 0  Change in appetite - 0 0 0 0  Feeling bad or failure about yourself  - 0 0 0 1  Trouble concentrating - 1 0 0 0  Moving slowly or fidgety/restless - 0 0 0 0  Suicidal thoughts - 0 0 0 0  PHQ-9 Score - 4 0 2 3  Difficult doing work/chores - Not difficult at all - Not difficult at all Not difficult at all   PHQ-2/9 Result reviewed   Fall Risk: Fall Risk  11/19/2019 05/10/2019 04/24/2019 04/16/2019 04/01/2019  Falls in the past year? 0 0 0 0 0  Number falls in past yr: 0 0 0 0 -  Injury with Fall? 0 0 0 0 -  Comment - - - - -      Objective:   Vitals:   11/19/19 1325  BP: 120/90  Pulse: 87  Resp: 16  Temp: 98.4 F (36.9 C)  TempSrc: Oral  SpO2: 97%  Weight: 158 lb 14.4 oz (72.1 kg)  Height: 5\' 3"  (1.6 m)    Body mass index is 28.15 kg/m.  Physical Exam   NAD, masked HEENT - [place]/AT, sclera anicteric, PERRL, EOMI, conj - non-inj'ed, no focal sinus tenderness, nares patent, left TM and canal clear, right canal with erythematous and mildly swollen diffusely, with the light reflex good and no bulging TM, pharynx clear, nontender palpating the right TMJ with no pain opening and closing his jaw at the TMJ, no dislocation or subluxation of the TMJ.  Poor dentition noted, with a bottom right tooth cracked, no marked erythema or  swelling of the gum area, not markedly tender palpating along the jaw where he feels some of the discomfort radiating at times. Neck - supple, no adenopathy,  Car - RRR without m/g/r Pulm- RR and effort normal at rest, CTA without wheeze or rales Neuro/psychiatric - affect was not flat, appropriate with conversation  Alert with normal speech    Results for orders placed or performed  during the hospital encounter of 04/20/19  Urine culture   Specimen: Urine, Clean Catch  Result Value Ref Range   Specimen Description      URINE, CLEAN CATCH Performed at [facility], [address] [place], [place] [address]    Special Requests      Normal Performed at [facility], [address] [place], [place] [address]    Culture      NO GROWTH Performed at [facility], [place] [address] [place], [place] [address]    Report Status 04/21/2019 FINAL   Urinalysis, Complete w Microscopic  Result Value Ref Range   Color, Urine YELLOW (A) YELLOW   APPearance CLEAR (A) CLEAR   Specific Gravity, Urine 1.011 1.005 - 1.030   pH 6.0 5.0 - 8.0   Glucose, UA NEGATIVE NEGATIVE mg/dL   Hgb urine dipstick LARGE (A) NEGATIVE   Bilirubin Urine NEGATIVE NEGATIVE   Ketones, ur NEGATIVE NEGATIVE mg/dL   Protein, ur NEGATIVE NEGATIVE mg/dL   Nitrite NEGATIVE NEGATIVE   Leukocytes,Ua SMALL (A) NEGATIVE   RBC / HPF >50 (H) 0 - 5 RBC/hpf   WBC, UA 21-50 0 - 5 WBC/hpf   Bacteria, UA NONE SEEN NONE SEEN   Squamous Epithelial / LPF 0-5 0 - 5       Assessment & Plan:   1. Acute diffuse otitis externa of right ear Discussed concerns for a likely outer ear infection, and recommended the drops prescribed below to help. - NEOMYCIN-POLYMYXIN-HYDROCORTISONE (CORTISPORIN) 1 % SOLN OTIC solution; Place 3 drops into the right ear 4 (four) times daily.  Dispense: 10 mL; Refill: 0 Also recommended not using Q-tips presently  Can continue to use an ibuprofen product as needed for discomfort,  and recommended taking with food.  2. Pain in lower jaw If the discomfort read radiates in the lower jaw area persists as the ear improves, do feel he should see a dentist, and strongly encouraged that.  Often nerve root pain can be a source of this type of discomfort. Doubt any TMJ component.  3. Hypertension goal BP (blood pressure) < 140/90 Blood pressure was borderline today, and he noted he is taking his blood pressure medication. May be up some from the discomfort here recently. He did not follow-up after our last visit, nor with the follow-up plan with my colleague Delsa Grana in the more recent past. I did recommend he schedule follow-up here in the near future for his other medical issues.  Including ensuring that his blood pressure is remaining controlled.   If symptoms not improving or more problematic over time should follow-up.    Towanda Malkin, MD 11/19/19 1:51 PM

## 2019-11-18 NOTE — Telephone Encounter (Signed)
Please call and schedule in person visit.

## 2019-11-18 NOTE — Telephone Encounter (Signed)
Pt states he has ear pain and headache and he thinks it is his sinuses. Please advise.

## 2019-11-18 NOTE — Telephone Encounter (Signed)
Please advise as to in office appt for virtual? Copied from [place] [id number]. Topic: Appointment Scheduling - Scheduling Inquiry for Clinic >> Nov 18, 2019  2:00 PM Xavier [name] wrote: Best contact: Pt has ear complications that could possibly be a sinus infection he states. [phone], wants to come in to the office and wants to know if PCP will approve of seeing him in person. Please advise

## 2019-11-19 ENCOUNTER — Ambulatory Visit (INDEPENDENT_AMBULATORY_CARE_PROVIDER_SITE_OTHER): Payer: Medicare HMO | Admitting: Internal Medicine

## 2019-11-19 ENCOUNTER — Encounter: Payer: Self-pay | Admitting: Internal Medicine

## 2019-11-19 ENCOUNTER — Other Ambulatory Visit: Payer: Self-pay

## 2019-11-19 VITALS — BP 120/90 | HR 87 | Temp 98.4°F | Resp 16 | Ht 63.0 in | Wt 158.9 lb

## 2019-11-19 DIAGNOSIS — R6884 Jaw pain: Secondary | ICD-10-CM

## 2019-11-19 DIAGNOSIS — I10 Essential (primary) hypertension: Secondary | ICD-10-CM

## 2019-11-19 DIAGNOSIS — H60311 Diffuse otitis externa, right ear: Secondary | ICD-10-CM | POA: Diagnosis not present

## 2019-11-19 MED ORDER — NEOMYCIN-POLYMYXIN-HC 1 % OT SOLN: 3.0000 [drp] | Freq: Four times a day (QID) | OTIC | 0 refills | Status: DC

## 2019-11-19 NOTE — Patient Instructions (Signed)

## 2019-12-24 ENCOUNTER — Ambulatory Visit: Payer: Medicare HMO | Admitting: Podiatry

## 2020-01-30 ENCOUNTER — Ambulatory Visit: Payer: Medicare HMO | Admitting: Internal Medicine

## 2020-02-26 ENCOUNTER — Other Ambulatory Visit: Payer: Self-pay | Admitting: Internal Medicine

## 2020-02-26 DIAGNOSIS — H60311 Diffuse otitis externa, right ear: Secondary | ICD-10-CM

## 2020-02-26 DIAGNOSIS — I10 Essential (primary) hypertension: Secondary | ICD-10-CM

## 2020-02-26 MED ORDER — LOSARTAN POTASSIUM-HCTZ 100-25 MG PO TABS: 1.0000 | ORAL_TABLET | Freq: Every day | ORAL | 0 refills | Status: DC

## 2020-02-26 MED ORDER — PRAVASTATIN SODIUM 40 MG PO TABS: 40.0000 mg | ORAL_TABLET | Freq: Every day | ORAL | 0 refills | Status: DC

## 2020-02-26 NOTE — Telephone Encounter (Signed)
Medication Refill - Medication: losartan-hydrochlorothiazide (HYZAAR) 100-25 MG tablet pravastatin (PRAVACHOL) 40 MG tablet     Preferred Pharmacy (with phone number or street name):  [facility], [place]. Phone:  [phone]  Fax:  [phone]       Agent: Please be advised that RX refills may take up to 3 business days. We ask that you follow-up with your pharmacy.

## 2020-03-03 ENCOUNTER — Encounter: Payer: Self-pay | Admitting: Family Medicine

## 2020-03-03 ENCOUNTER — Other Ambulatory Visit: Payer: Self-pay

## 2020-03-03 ENCOUNTER — Telehealth (INDEPENDENT_AMBULATORY_CARE_PROVIDER_SITE_OTHER): Payer: Medicare HMO | Admitting: Family Medicine

## 2020-03-03 DIAGNOSIS — R7303 Prediabetes: Secondary | ICD-10-CM

## 2020-03-03 DIAGNOSIS — I739 Peripheral vascular disease, unspecified: Secondary | ICD-10-CM

## 2020-03-03 DIAGNOSIS — D649 Anemia, unspecified: Secondary | ICD-10-CM

## 2020-03-03 DIAGNOSIS — Z5181 Encounter for therapeutic drug level monitoring: Secondary | ICD-10-CM | POA: Diagnosis not present

## 2020-03-03 DIAGNOSIS — I69354 Hemiplegia and hemiparesis following cerebral infarction affecting left non-dominant side: Secondary | ICD-10-CM | POA: Diagnosis not present

## 2020-03-03 DIAGNOSIS — I10 Essential (primary) hypertension: Secondary | ICD-10-CM

## 2020-03-03 DIAGNOSIS — E785 Hyperlipidemia, unspecified: Secondary | ICD-10-CM

## 2020-03-03 MED ORDER — PRAVASTATIN SODIUM 40 MG PO TABS: 40.0000 mg | ORAL_TABLET | Freq: Every day | ORAL | 1 refills | Status: DC

## 2020-03-03 MED ORDER — LOSARTAN POTASSIUM-HCTZ 100-25 MG PO TABS: 1.0000 | ORAL_TABLET | Freq: Every day | ORAL | 1 refills | Status: DC

## 2020-03-03 NOTE — Progress Notes (Signed)
Name: Xavier [name]   MRN: [id number]    DOB: Sep 30, 1950   Date:03/03/2020       Progress Note  Subjective:    Chief Complaint  Chief Complaint  Patient presents with  . Hyperlipidemia  . Hypertension    Follow up medication refills    I connected with  Xavier [name] on 03/03/20 at  3:40 PM EST by telephone and verified that I am speaking with the correct person using two identifiers.  I discussed the limitations, risks, security and privacy concerns of performing an evaluation and management service by telephone and the availability of in person appointments. Staff also discussed with the patient that there may be a patient responsible charge related to this service. Patient Location:  home Provider Location:  [facility] office Additional Individuals present: none  HPI  HTN - managed on losartan-HCTZ 100-25 BP Readings from Last 3 Encounters:  11/19/19 120/90  05/10/19 118/74  05/08/19 128/81  BP has been well controlled on current meds No concerns about BP Not checking BP at home  Hyperlipidemia: Currently treated with pravastatin, pt reports good med compliance Last Lipids: Lab Results  Component Value Date   CHOL 156 03/29/2019   HDL 65 03/29/2019   LDLCALC 77 03/29/2019   TRIG 49 03/29/2019   CHOLHDL 2.4 03/29/2019   - Denies: Chest pain, shortness of breath, myalgias, claudication  Anemia -w/o melena, hematochezia -  Hemoglobin  Date Value Ref Range Status  03/29/2019 12.7 (L) 13.2 - 17.1 g/dL Final  09/19/2017 12.6 (L) 13.2 - 17.1 g/dL Final  02/16/2017 13.0 (L) 13.2 - 17.1 g/dL Final  02/03/2016 13.4 13.2 - 17.1 g/dL Final  07/21/2014 12.1 (L) 12.6 - 17.7 g/dL Final   Hx of prediabetes Lab Results  Component Value Date   HGBA1C 5.2 03/29/2019       Patient Active Problem List   Diagnosis Date Noted  . Mild anemia 05/10/2019  . Hemiparesis affecting left side as late effect of cerebrovascular accident ([place]) 04/16/2019  . Prediabetes  03/29/2019  . Increased prostate specific antigen (PSA) velocity 03/29/2019  . Lower urinary tract symptoms (LUTS) 02/22/2019  . Impingement syndrome of shoulder, left 09/28/2017  . Hearing loss 02/16/2017  . Umbilical hernia without obstruction and without gangrene 06/21/2016  . Medication monitoring encounter 02/23/2016  . Hyperglycemia 02/04/2016  . Left carotid bruit 02/03/2016  . Arthritis of both hands 03/10/2015  . Spider varicose veins 03/10/2015  . Low serum thyroid stimulating hormone (TSH) 07/22/2014  . Hyperlipidemia LDL goal <70 07/21/2014  . Hypertension goal BP (blood pressure) < 140/90 07/21/2014  . Peripheral arterial disease ([place]) 07/21/2014    Current Outpatient Medications:  .  aspirin EC 81 MG tablet, Take 1 tablet (81 mg total) by mouth daily., Disp: , Rfl:  .  ibuprofen (ADVIL) 600 MG tablet, Take 1 tablet (600 mg total) by mouth every 8 (eight) hours as needed for mild pain or moderate pain., Disp: 30 tablet, Rfl: 1 .  losartan-hydrochlorothiazide (HYZAAR) 100-25 MG tablet, Take 1 tablet by mouth daily., Disp: 90 tablet, Rfl: 0 .  pravastatin (PRAVACHOL) 40 MG tablet, Take 1 tablet (40 mg total) by mouth at bedtime., Disp: 90 tablet, Rfl: 0 .  NEOMYCIN-POLYMYXIN-HYDROCORTISONE (CORTISPORIN) 1 % SOLN OTIC solution, Place 3 drops into the right ear 4 (four) times daily. (Patient not taking: Reported on 03/03/2020), Disp: 10 mL, Rfl: 0 No Known Allergies  Past Surgical History:  Procedure Laterality Date  . CYSTOSCOPY WITH RETROGRADE URETHROGRAM  N/A 04/17/2019   Procedure: CYSTOSCOPY WITH BALLOON DILATION OF URETHRAL STRICTURE;  Surgeon: Abbie Sons, MD;  Location: [facility];  Service: Urology;  Laterality: N/A;  . TONSILLECTOMY  1958  . TONSILLECTOMY    . UMBILICAL HERNIA REPAIR N/A 04/17/2019   Procedure: HERNIA REPAIR UMBILICAL ADULT;  Surgeon: Olean Ree, MD;  Location: [facility];  Service: General;  Laterality: N/A;  . URETHROTOMY N/A 04/17/2019   Procedure:  DIRECT VISION INTERNAL URETHROTOMY;  Surgeon: Abbie Sons, MD;  Location: [facility] OR[facility];  Service: Urology;  Laterality: N/A;   Family History  Problem Relation Age of Onset  . Arthritis Mother   . Kidney disease Mother   . Heart disease Father   . Cancer Father        lung, colon  . Diabetes Brother   . Cancer Brother        stomach tumor  . Glaucoma Maternal Grandmother   . Kidney disease Paternal Grandmother   . Neuropathy Brother    Social History   Socioeconomic History  . Marital status: Married    Spouse name: Not on file  . Number of children: Not on file  . Years of education: Not on file  . Highest education level: Not on file  Occupational History  . Not on file  Tobacco Use  . Smoking status: Never Smoker  . Smokeless tobacco: Never Used  Vaping Use  . Vaping Use: Never used  Substance and Sexual Activity  . Alcohol use: Yes    Alcohol/week: 0.0 standard drinks    Comment: occasional  . Drug use: No  . Sexual activity: Yes  Other Topics Concern  . Not on file  Social History Narrative  . Not on file   Social Determinants of Health   Financial Resource Strain: Not on file  Food Insecurity: Not on file  Transportation Needs: Not on file  Physical Activity: Not on file  Stress: Not on file  Social Connections: Not on file  Intimate Partner Violence: Not on file    Chart Review Today: I personally reviewed active problem list, medication list, allergies, family history, social history, health maintenance, notes from last encounter, lab results, imaging with the patient/caregiver today.  Review of Systems  Constitutional: Negative.   HENT: Negative.   Eyes: Negative.   Respiratory: Negative.   Cardiovascular: Negative.   Gastrointestinal: Negative.   Endocrine: Negative.   Genitourinary: Negative.   Musculoskeletal: Negative.   Skin: Negative.   Allergic/Immunologic: Negative.   Neurological: Negative.   Hematological: Negative.    Psychiatric/Behavioral: Negative.   All other systems reviewed and are negative.    Objective:    Virtual encounter, vitals limited, only able to obtain the following: There were no vitals filed for this visit. There is no height or weight on file to calculate BMI. Nursing Note and Vital Signs reviewed.  Physical Exam Vitals and nursing note reviewed.  Neurological:     Mental Status: He is alert.  Psychiatric:        Mood and Affect: Mood normal.     PE limited by telephone encounter   PHQ2/9: Depression screen [facility] 2/9 03/03/2020 11/19/2019 05/10/2019 04/16/2019 03/29/2019  Decreased Interest 0 0 0 0 0  Down, Depressed, Hopeless 0 0 1 0 1  PHQ - 2 Score 0 0 1 0 1  Altered sleeping - - 2 0 1  Tired, decreased energy - - 0 0 0  Change in appetite - - 0 0 0  Feeling bad or failure about yourself  - - 0 0 0  Trouble concentrating - - 1 0 0  Moving slowly or fidgety/restless - - 0 0 0  Suicidal thoughts - - 0 0 0  PHQ-9 Score - - 4 0 2  Difficult doing work/chores - - Not difficult at all - Not difficult at all   PHQ-2/9 Result is neg, reviewed  Fall Risk: Fall Risk  03/03/2020 11/19/2019 05/10/2019 04/24/2019 04/16/2019  Falls in the past year? 0 0 0 0 0  Number falls in past yr: 0 0 0 0 0  Injury with Fall? 0 0 0 0 0  Comment - - - - -  Follow up Falls evaluation completed - - - -     Assessment and Plan:     ICD-10-CM   1. Mild anemia  D64.9 CBC with Differential/Platelet   Has been stable denies melena or hematochezia, recheck labs  2. Hemiparesis affecting left side as late effect of cerebrovascular accident ([place])  X11.552    On statin  3. Prediabetes  C80.22 COMPLETE METABOLIC PANEL WITH GFR    Hemoglobin A1c   Last A1c was in normal range, recheck  4. Medication monitoring encounter  Z51.81 CBC with Differential/Platelet    COMPLETE METABOLIC PANEL WITH GFR    Lipid panel    Hemoglobin A1c  5. Hypertension goal BP (blood pressure) < 140/90  I10 COMPLETE  METABOLIC PANEL WITH GFR   Stable, well-controlled, patient has received a 21-month refill on meds and is due for labs  6. Hyperlipidemia LDL goal <70  V36.1 COMPLETE METABOLIC PANEL WITH GFR    Lipid panel   Good statin compliance no side effects concerns myalgias or fatigue, due for repeat of lipid panel  7. Peripheral arterial disease ([facility])  I73.9    no new or worsening sx    I discussed the assessment and treatment plan with the patient. The patient was provided an opportunity to ask questions and all were answered. The patient agreed with the plan and demonstrated an understanding of the instructions.  6 month in office f/up visit  I provided 20+ minutes of non-face-to-face time during this encounter.  Delsa Grana, PA-C 03/03/20 6:21 PM

## 2020-04-17 ENCOUNTER — Encounter: Payer: Self-pay | Admitting: Family Medicine

## 2020-04-17 ENCOUNTER — Other Ambulatory Visit: Payer: Self-pay

## 2020-04-17 ENCOUNTER — Ambulatory Visit (INDEPENDENT_AMBULATORY_CARE_PROVIDER_SITE_OTHER): Payer: Medicare HMO | Admitting: Family Medicine

## 2020-04-17 VITALS — BP 140/88 | HR 87 | Temp 98.0°F | Resp 16 | Ht 63.0 in | Wt 158.0 lb

## 2020-04-17 DIAGNOSIS — Z9114 Patient's other noncompliance with medication regimen: Secondary | ICD-10-CM | POA: Diagnosis not present

## 2020-04-17 DIAGNOSIS — R972 Elevated prostate specific antigen [PSA]: Secondary | ICD-10-CM | POA: Diagnosis not present

## 2020-04-17 DIAGNOSIS — I739 Peripheral vascular disease, unspecified: Secondary | ICD-10-CM

## 2020-04-17 DIAGNOSIS — D638 Anemia in other chronic diseases classified elsewhere: Secondary | ICD-10-CM

## 2020-04-17 DIAGNOSIS — R399 Unspecified symptoms and signs involving the genitourinary system: Secondary | ICD-10-CM

## 2020-04-17 DIAGNOSIS — Z91148 Patient's other noncompliance with medication regimen for other reason: Secondary | ICD-10-CM

## 2020-04-17 DIAGNOSIS — Z599 Problem related to housing and economic circumstances, unspecified: Secondary | ICD-10-CM

## 2020-04-17 DIAGNOSIS — I69354 Hemiplegia and hemiparesis following cerebral infarction affecting left non-dominant side: Secondary | ICD-10-CM | POA: Diagnosis not present

## 2020-04-17 DIAGNOSIS — R7303 Prediabetes: Secondary | ICD-10-CM

## 2020-04-17 DIAGNOSIS — Z8042 Family history of malignant neoplasm of prostate: Secondary | ICD-10-CM

## 2020-04-17 DIAGNOSIS — I10 Essential (primary) hypertension: Secondary | ICD-10-CM

## 2020-04-17 MED ORDER — LOSARTAN POTASSIUM 100 MG PO TABS: 100.0000 mg | ORAL_TABLET | Freq: Every day | ORAL | 1 refills | Status: DC

## 2020-04-17 MED ORDER — ATORVASTATIN CALCIUM 40 MG PO TABS: 40.0000 mg | ORAL_TABLET | Freq: Every day | ORAL | 1 refills | Status: DC

## 2020-04-17 NOTE — Progress Notes (Signed)
Name: Xavier [name]   MRN: [id number]    DOB: 09-Nov-1950   Date:04/17/2020       Progress Note  Subjective  Chief Complaint  Follow Up  HPI  History of CVA with left side hemiparesis: it happened in 2013, he still has some left side weakness. He is taking pravastatin daily but has been out of bp medication. He denies headaches.   Anemia of chronic diease: we will recheck level  HTN: he has a rx of losartan hctz 100/25 but has been out for the past two weeks because of cost of gas. Unable to go to the pharmacy. He denies chest pain, palpitation of SOB  LUTS, very high PSA: we will recheck PSA today, he has urinary frequency and nocturia. We will recheck labs today, if still high we will refer him to Urologist. His father had prostate cancer  Peripheral vascular disease: he can walk about one mile before having to stop because left lower leg aches, it improves with rest, he is on statin therapy. He has not been taking aspirin lately, advised to resume it daily   Dysthymia: he is struggling financially , they live 14 miles away from town, he is having difficulty his siblings, getting rx from pharmacy, going to the doctor due to transportation.   Patient Active Problem List   Diagnosis Date Noted  . Mild anemia 05/10/2019  . Hemiparesis affecting left side as late effect of cerebrovascular accident ([place]) 04/16/2019  . Prediabetes 03/29/2019  . Increased prostate specific antigen (PSA) velocity 03/29/2019  . Lower urinary tract symptoms (LUTS) 02/22/2019  . Impingement syndrome of shoulder, left 09/28/2017  . Hearing loss 02/16/2017  . Umbilical hernia without obstruction and without gangrene 06/21/2016  . Medication monitoring encounter 02/23/2016  . Hyperglycemia 02/04/2016  . Left carotid bruit 02/03/2016  . Arthritis of both hands 03/10/2015  . Spider varicose veins 03/10/2015  . Low serum thyroid stimulating hormone (TSH) 07/22/2014  . Hyperlipidemia LDL goal <70 07/21/2014   . Hypertension goal BP (blood pressure) < 140/90 07/21/2014  . Peripheral arterial disease ([place]) 07/21/2014    Past Surgical History:  Procedure Laterality Date  . CYSTOSCOPY WITH RETROGRADE URETHROGRAM N/A 04/17/2019   Procedure: CYSTOSCOPY WITH BALLOON DILATION OF URETHRAL STRICTURE;  Surgeon: Abbie Sons, MD;  Location: [facility] ORS;  Service: Urology;  Laterality: N/A;  . TONSILLECTOMY  1958  . TONSILLECTOMY    . UMBILICAL HERNIA REPAIR N/A 04/17/2019   Procedure: HERNIA REPAIR UMBILICAL ADULT;  Surgeon: Olean Ree, MD;  Location: [facility] ORS;  Service: General;  Laterality: N/A;  . URETHROTOMY N/A 04/17/2019   Procedure: DIRECT VISION INTERNAL URETHROTOMY;  Surgeon: Abbie Sons, MD;  Location: [facility] ORS;  Service: Urology;  Laterality: N/A;    Family History  Problem Relation Age of Onset  . Arthritis Mother   . Kidney disease Mother   . Heart disease Father   . Cancer Father        lung, colon  . Diabetes Brother   . Cancer Brother        stomach tumor  . Glaucoma Maternal Grandmother   . Kidney disease Paternal Grandmother   . Neuropathy Brother     Social History   Tobacco Use  . Smoking status: Never Smoker  . Smokeless tobacco: Never Used  Substance Use Topics  . Alcohol use: Yes    Alcohol/week: 0.0 standard drinks    Comment: occasional     Current Outpatient Medications:  .  aspirin  EC 81 MG tablet, Take 1 tablet (81 mg total) by mouth daily., Disp: , Rfl:  .  atorvastatin (LIPITOR) 40 MG tablet, Take 1 tablet (40 mg total) by mouth daily. New cholesterol medication - in place of pravastatin, Disp: 90 tablet, Rfl: 1 .  ibuprofen (ADVIL) 600 MG tablet, Take 1 tablet (600 mg total) by mouth every 8 (eight) hours as needed for mild pain or moderate pain., Disp: 30 tablet, Rfl: 1 .  losartan (COZAAR) 100 MG tablet, Take 1 tablet (100 mg total) by mouth daily. New bp medication, Disp: 90 tablet, Rfl: 1  No Known Allergies  I personally reviewed active  problem list, medication list, allergies, family history, social history, health maintenance with the patient/caregiver today.   ROS  Constitutional: Negative for fever or weight change.  Respiratory: Negative for cough and shortness of breath.   Cardiovascular: Negative for chest pain or palpitations.  Gastrointestinal: Negative for abdominal pain, no bowel changes.  Musculoskeletal: positive  for gait problem but no joint swelling.  Skin: Negative for rash.  Neurological: Negative for dizziness or headache.  No other specific complaints in a complete review of systems (except as listed in HPI above).  Objective  Vitals:   04/17/20 1352  BP: 140/88  Pulse: 87  Resp: 16  Temp: 98 F (36.7 C)  TempSrc: Oral  SpO2: 98%  Weight: 158 lb (71.7 kg)  Height: 5\' 3"  (1.6 m)    Body mass index is 27.99 kg/m.  Physical Exam  Constitutional: Patient appears well-developed and well-nourished. No distress.  HEENT: head atraumatic, normocephalic, pupils equal and reactive to light,  neck supple Cardiovascular: Normal rate, regular rhythm and normal heart sounds.  No murmur heard. No BLE edema. Pulmonary/Chest: Effort normal and breath sounds normal. No respiratory distress. Abdominal: Soft.  There is no tenderness. Psychiatric: Patient has a normal mood and affect. behavior is normal. Judgment and thought content normal.  PHQ2/9: Depression screen [facility] 2/9 04/17/2020 03/03/2020 11/19/2019 05/10/2019 04/16/2019  Decreased Interest 0 0 0 0 0  Down, Depressed, Hopeless 1 0 0 1 0  PHQ - 2 Score 1 0 0 1 0  Altered sleeping 1 - - 2 0  Tired, decreased energy 1 - - 0 0  Change in appetite 0 - - 0 0  Feeling bad or failure about yourself  0 - - 0 0  Trouble concentrating 0 - - 1 0  Moving slowly or fidgety/restless 0 - - 0 0  Suicidal thoughts 0 - - 0 0  PHQ-9 Score 3 - - 4 0  Difficult doing work/chores - - - Not difficult at all -    phq 9 is positive  Fall Risk: Fall Risk  04/17/2020  03/03/2020 11/19/2019 05/10/2019 04/24/2019  Falls in the past year? 0 0 0 0 0  Number falls in past yr: 0 0 0 0 0  Injury with Fall? 0 0 0 0 0  Comment - - - - -  Follow up - Falls evaluation completed - - -     Functional Status Survey: Is the patient deaf or have difficulty hearing?: Yes Does the patient have difficulty seeing, even when wearing glasses/contacts?: No Does the patient have difficulty concentrating, remembering, or making decisions?: Yes Does the patient have difficulty walking or climbing stairs?: No Does the patient have difficulty dressing or bathing?: No Does the patient have difficulty doing errands alone such as visiting a doctor's office or shopping?: No    Assessment & Plan  1. Hemiparesis affecting left side as late effect of cerebrovascular accident ([place])  - AMB Referral to [place] - Lipid panel - atorvastatin (LIPITOR) 40 MG tablet; Take 1 tablet (40 mg total) by mouth daily. New cholesterol medication - in place of pravastatin  Dispense: 90 tablet; Refill: 1  2. Peripheral arterial disease ([facility])  - atorvastatin (LIPITOR) 40 MG tablet; Take 1 tablet (40 mg total) by mouth daily. New cholesterol medication - in place of pravastatin  Dispense: 90 tablet; Refill: 1  3. Hypertension goal BP (blood pressure) < 140/90  - COMPLETE METABOLIC PANEL WITH GFR - CBC with Differential/Platelet - losartan (COZAAR) 100 MG tablet; Take 1 tablet (100 mg total) by mouth daily. New bp medication  Dispense: 90 tablet; Refill: 1  4. Prediabetes  Last A1C was normal   5. Increased prostate specific antigen (PSA) velocity  - PSA  6. Lower urinary tract symptoms (LUTS)   7. Anemia of chronic disease  - Lipid panel  8. Non compliance w medication regimen   9. Financial difficulties  - AMB Referral to [place]  10. Family history of prostate cancer  - PSA

## 2020-04-18 LAB — COMPLETE METABOLIC PANEL WITH GFR
AG Ratio: 1.8 (calc) (ref 1.0–2.5)
ALT: 17 U/L (ref 9–46)
AST: 21 U/L (ref 10–35)
Albumin: 4.5 g/dL (ref 3.6–5.1)
Alkaline phosphatase (APISO): 65 U/L (ref 35–144)
BUN: 9 mg/dL (ref 7–25)
CO2: 27 mmol/L (ref 20–32)
Calcium: 9.4 mg/dL (ref 8.6–10.3)
Chloride: 105 mmol/L (ref 98–110)
Creat: 0.98 mg/dL (ref 0.70–1.25)
GFR, Est African American: 91 mL/min/{1.73_m2} (ref >=60)
GFR, Est Non African American: 78 mL/min/{1.73_m2} (ref >=60)
Globulin: 2.5 g/dL (calc) (ref 1.9–3.7)
Glucose, Bld: 86 mg/dL (ref 65–99)
Potassium: 4.4 mmol/L (ref 3.5–5.3)
Sodium: 138 mmol/L (ref 135–146)
Total Bilirubin: 0.6 mg/dL (ref 0.2–1.2)
Total Protein: 7 g/dL (ref 6.1–8.1)

## 2020-04-18 LAB — CBC WITH DIFFERENTIAL/PLATELET
Absolute Monocytes: 530 cells/uL (ref 200–950)
Basophils Absolute: 21 cells/uL (ref 0–200)
Basophils Relative: 0.4 %
Eosinophils Absolute: 73 cells/uL (ref 15–500)
Eosinophils Relative: 1.4 %
HCT: 38.5 % (ref 38.5–50.0)
Hemoglobin: 13 g/dL — ABNORMAL LOW (ref 13.2–17.1)
Lymphs Abs: 1144 cells/uL (ref 850–3900)
MCH: 32.5 pg (ref 27.0–33.0)
MCHC: 33.8 g/dL (ref 32.0–36.0)
MCV: 96.3 fL (ref 80.0–100.0)
MPV: 10.3 fL (ref 7.5–12.5)
Monocytes Relative: 10.2 %
Neutro Abs: 3432 cells/uL (ref 1500–7800)
Neutrophils Relative %: 66 %
Platelets: 246 10*3/uL (ref 140–400)
RBC: 4 10*6/uL — ABNORMAL LOW (ref 4.20–5.80)
RDW: 11.8 % (ref 11.0–15.0)
Total Lymphocyte: 22 %
WBC: 5.2 10*3/uL (ref 3.8–10.8)

## 2020-04-18 LAB — PSA: PSA: 12.48 ng/mL — ABNORMAL HIGH (ref <=4.0)

## 2020-04-18 LAB — LIPID PANEL
Cholesterol: 158 mg/dL (ref <200)
HDL: 62 mg/dL (ref >=40)
LDL Cholesterol (Calc): 81 mg/dL (calc)
Non-HDL Cholesterol (Calc): 96 mg/dL (calc) (ref <130)
Total CHOL/HDL Ratio: 2.5 (calc) (ref <5.0)
Triglycerides: 73 mg/dL (ref <150)

## 2020-04-20 ENCOUNTER — Other Ambulatory Visit: Payer: Self-pay

## 2020-04-20 DIAGNOSIS — R972 Elevated prostate specific antigen [PSA]: Secondary | ICD-10-CM

## 2020-04-22 ENCOUNTER — Other Ambulatory Visit: Payer: Self-pay

## 2020-04-22 ENCOUNTER — Encounter: Payer: Self-pay | Admitting: Urology

## 2020-04-22 ENCOUNTER — Other Ambulatory Visit
Admission: RE | Admit: 2020-04-22 | Discharge: 2020-04-22 | Disposition: A | Discharge status: Home / Self Care | Payer: Medicare HMO | Source: Ambulatory Visit | Attending: Urology | Admitting: Urology

## 2020-04-22 ENCOUNTER — Ambulatory Visit (INDEPENDENT_AMBULATORY_CARE_PROVIDER_SITE_OTHER): Payer: Medicare HMO | Admitting: Urology

## 2020-04-22 ENCOUNTER — Telehealth: Payer: Self-pay | Admitting: Internal Medicine

## 2020-04-22 ENCOUNTER — Other Ambulatory Visit: Payer: Self-pay | Admitting: Urology

## 2020-04-22 VITALS — BP 190/109 | HR 88 | Ht 63.0 in | Wt 158.0 lb

## 2020-04-22 DIAGNOSIS — N35912 Unspecified bulbous urethral stricture, male: Secondary | ICD-10-CM

## 2020-04-22 DIAGNOSIS — N390 Urinary tract infection, site not specified: Secondary | ICD-10-CM | POA: Diagnosis not present

## 2020-04-22 DIAGNOSIS — Z20822 Contact with and (suspected) exposure to covid-19: Secondary | ICD-10-CM | POA: Diagnosis not present

## 2020-04-22 DIAGNOSIS — Z01812 Encounter for preprocedural laboratory examination: Secondary | ICD-10-CM | POA: Insufficient documentation

## 2020-04-22 DIAGNOSIS — N138 Other obstructive and reflux uropathy: Secondary | ICD-10-CM

## 2020-04-22 DIAGNOSIS — N401 Enlarged prostate with lower urinary tract symptoms: Secondary | ICD-10-CM | POA: Diagnosis not present

## 2020-04-22 LAB — BLADDER SCAN AMB NON-IMAGING

## 2020-04-22 MED ORDER — SULFAMETHOXAZOLE-TRIMETHOPRIM 800-160 MG PO TABS: 1.0000 | ORAL_TABLET | Freq: Two times a day (BID) | ORAL | 0 refills | Status: AC

## 2020-04-22 MED ORDER — SULFAMETHOXAZOLE-TRIMETHOPRIM 800-160 MG PO TABS
1.0000 | ORAL_TABLET | Freq: Once | ORAL | Status: AC
  Administered 2020-04-22: 1 via ORAL

## 2020-04-22 NOTE — Telephone Encounter (Signed)
   Telephone encounter was:  Unsuccessful.  04/22/2020 Name: Xavier [name] MRN: [id number] DOB: [birth year]  Unsuccessful outbound call made today to assist with:  Transportation Needs   Outreach Attempt:  1st Attempt  All of the numbers I called either didn't have a vm connected or no one answered at all, was not able to leave a message.   [facility], Care Management Phone: [phone] Email: [email or web address]

## 2020-04-22 NOTE — Patient Instructions (Signed)
Urethral Stricture  Urethral stricture is narrowing of the tube (urethra) that carries urine from the bladder out of the body. The urethra can become narrow due to scar tissue from an injury or infection. This can make it difficult to pass urine. In women, the urethra opens above the vaginal opening. In men, the urethra opens at the tip of the penis, and the urethra is much longer than it is in women. Because of the length of the male urethra, urethral stricture is much more common in men. This condition is treated with surgery. What are the causes? In both men and women, common causes of urethral stricture include:  Urinary tract infection (UTI).  Sexually transmitted infection (STI).  Use of a tube placed into the urethra to drain urine from the bladder (urinary catheter).  Urinary tract surgery. In men, common causes of urethral stricture include:  A severe injury to the pelvis.  Prostate surgery.  Injury to the penis. In many cases, the cause of urethral stricture is not known. What increases the risk? You are more likely to develop this condition if you:  Are male. Men who have had prostate surgery are at risk of developing this condition.  Use a urinary catheter.  Have had urinary tract surgery. What are the signs or symptoms? The main symptom of this condition is difficulty passing urine. This may cause decreased urine flow, dribbling, or spraying of urine. Other symptom of this condition may include:  Frequent UTIs.  Blood in the urine.  Pain when urinating.  Swelling of the penis in men.  Inability to pass urine (urinary obstruction). How is this diagnosed? This condition may be diagnosed based on:  Your medical history and a physical exam.  Urine tests to check for infection or bleeding.  X-rays.  Ultrasound.  Retrograde urethrogram. This is a type of test in which dye is injected into the urethra and then an X-ray is taken.  Urethroscopy. This is when  a thin tube with a light and camera on the end (urethroscope) is used to look at the urethra. How is this treated? This condition is treated with surgery. The type of surgery that you have depends on the severity of your condition. You may have:  Urethral dilation. In this procedure, the narrow part of the urethra is stretched open (dilated) with dilating instruments or a small balloon.  Urethrotomy. In this procedure, a urethroscope is placed into the urethra, and the narrow part of the urethra is cut open with a surgical blade inserted through the urethroscope.  Open surgery. In this procedure, an incision is made in the urethra, the narrow part is removed, and the urethra is reconstructed. Follow these instructions at home:  Take over-the-counter and prescription medicines only as told by your health care provider.  If you were prescribed an antibiotic medicine, take it as told by your health care provider. Do not stop taking the antibiotic even if you start to feel better.  Drink enough fluid to keep your urine pale yellow.  Keep all follow-up visits as told by your health care provider. This is important.   Contact a health care provider if:  You have signs of a urinary tract infection, such as: ? Frequent urination or passing small amounts of urine frequently. ? Needing to urinate urgently. ? Pain or burning with urination. ? Urine that smells bad or unusual. ? Cloudy urine. ? Pain in the lower abdomen or back. ? Trouble urinating. ? Blood in the urine. ?  Vomiting or being less hungry than normal. ? Diarrhea or abdominal pain. ? Vaginal discharge, if you are male.  Your symptoms are getting worse instead of better. Get help right away if:  You cannot pass urine.  You have a fever.  You have swelling, bruising, or discoloration of your genital area. This includes the penis, scrotum, and inner thighs for men, and the outer genital organs (vulva) and inner thighs for  women.  You develop swelling in your legs.  You have difficulty breathing. Summary  Urethral stricture is narrowing of the tube (urethra) that carries urine from the bladder out of the body. The urethra can become narrow due to scar tissue from an injury or infection.  This condition can make it difficult to pass urine.  This condition is treated with surgery. The type of surgery that you have depends on the severity of your condition.  Contact a health care provider if your symptoms get worse or you have signs of a urinary tract infection. This information is not intended to replace advice given to you by your health care provider. Make sure you discuss any questions you have with your health care provider. Document Revised: 08/09/2017 Document Reviewed: 08/09/2017 Elsevier Patient Education  [place].

## 2020-04-22 NOTE — Progress Notes (Signed)
   04/22/2020 1:17 PM   Xavier [name] Sep 22, 1950 [id number]  Reason for visit: Elevated PSA, history of urethral stricture and recurrent UTIs  HPI: Mr. Xavier [name] is a 70 year old male sent over for an increase in his PSA to 12.5.  He has a complex urologic history.  He was originally seen by me in March 2021 with a significantly elevated PVR 450 mL and overflow incontinence.  On cystoscopy he was found to have a pinpoint bulbar urethral stricture that appeared short in length.  He underwent balloon dilation and DVIU with my partner Dr. Bernardo Heater on 04/17/2019 in a joint procedure with general surgery for a hernia repair.  His Foley was removed 2 days postop, but he did not follow-up at the recommended 6-week visit.  He reports worsening of his urinary symptoms over the last 6 months with weak stream, straining to urinate, feeling of incomplete emptying, and overflow incontinence requiring a depends.  Urinalysis today grossly concerning for infection and will send for culture.  PVR significantly elevated at greater than 500 mL.  On exam his lower abdomen is distended.  DRE today 20 g prostate, prominent right sided lobe, but no definite masses or nodules.  Normal-appearing meatus.  Unfortunately, I suspect he has had a recurrence of his bulbourethral stricture with incomplete emptying and a possible UTI that may be responsible for his elevated PSA.  We discussed other etiologies like prostate cancer.  --------------------------------------------------------------- Cystoscopy procedure note  He was amenable to cystoscopy in clinic today, and informed consent was obtained.  He was prepped and draped in standard sterile fashion.  Bactrim given for prophylaxis.  The cystoscope was inserted into the urethra and there was a 5 [place] proximal urethral stricture with what looked like some further narrowing proximally.  Unable to pass cystoscope into the  bladder.  -----------------------------------------------------------------  Options discussed again including balloon dilation with new Optilume balloon with ~20% recurrence rate versus definitive management with urethroplasty at [facility].  He would like to trial the Optilume balloon to see if he can avoid a more invasive reconstructive surgery.  Risks and benefits discussed extensively.  We also discussed the need to follow-up the PSA in 1 to 2 months to confirm downtrending after treatment of his urethral stricture, as well as the close monitoring of PVRs moving forward.  Bactrim DS twice daily x5 this week for suspected acute UTI, follow-up culture results Schedule cystoscopy and balloon dilation in OR this week  Billey Co, MD  [facility] [address] [place], [place] [address] [phone]

## 2020-04-23 ENCOUNTER — Telehealth: Payer: Self-pay | Admitting: *Deleted

## 2020-04-23 LAB — URINALYSIS, COMPLETE
Bilirubin, UA: NEGATIVE
Glucose, UA: NEGATIVE
Ketones, UA: NEGATIVE
Nitrite, UA: NEGATIVE
Protein,UA: NEGATIVE
Specific Gravity, UA: 1.015 (ref 1.005–1.030)
Urobilinogen, Ur: 0.2 mg/dL (ref 0.2–1.0)
pH, UA: 7 (ref 5.0–7.5)

## 2020-04-23 LAB — SARS CORONAVIRUS 2 (TAT 6-24 HRS): SARS Coronavirus 2: NEGATIVE

## 2020-04-23 LAB — MICROSCOPIC EXAMINATION: WBC, UA: >30 /hpf — AB (ref 0–5)

## 2020-04-24 ENCOUNTER — Other Ambulatory Visit: Payer: Self-pay

## 2020-04-24 ENCOUNTER — Ambulatory Visit
Admission: RE | Admit: 2020-04-24 | Discharge: 2020-04-24 | Disposition: A | Discharge status: Home / Self Care | Payer: Medicare HMO | Attending: Urology | Admitting: Urology

## 2020-04-24 ENCOUNTER — Ambulatory Visit: Payer: Medicare HMO | Admitting: Anesthesiology

## 2020-04-24 ENCOUNTER — Encounter
Admission: RE | Disposition: A | Discharge status: Home / Self Care | Payer: Self-pay | Source: Home / Self Care | Attending: Urology

## 2020-04-24 ENCOUNTER — Encounter: Payer: Self-pay | Admitting: Urology

## 2020-04-24 ENCOUNTER — Ambulatory Visit: Payer: Medicare HMO

## 2020-04-24 DIAGNOSIS — Z8673 Personal history of transient ischemic attack (TIA), and cerebral infarction without residual deficits: Secondary | ICD-10-CM | POA: Diagnosis not present

## 2020-04-24 DIAGNOSIS — N35912 Unspecified bulbous urethral stricture, male: Secondary | ICD-10-CM | POA: Diagnosis not present

## 2020-04-24 DIAGNOSIS — Z01810 Encounter for preprocedural cardiovascular examination: Secondary | ICD-10-CM | POA: Diagnosis not present

## 2020-04-24 DIAGNOSIS — N35911 Unspecified urethral stricture, male, meatal: Secondary | ICD-10-CM | POA: Diagnosis not present

## 2020-04-24 HISTORY — PX: CYSTOSCOPY WITH URETHRAL DILATATION: SHX5125

## 2020-04-24 SURGERY — CYSTOSCOPY, WITH URETHRAL DILATION
Anesthesia: General

## 2020-04-24 MED ORDER — FENTANYL CITRATE (PF) 100 MCG/2ML IJ SOLN
INTRAMUSCULAR | Status: DC | PRN
  Administered 2020-04-24: 50 ug via INTRAVENOUS

## 2020-04-24 MED ORDER — EPHEDRINE SULFATE 50 MG/ML IJ SOLN
INTRAMUSCULAR | Status: DC | PRN
  Administered 2020-04-24 (×2): 10 mg via INTRAVENOUS

## 2020-04-24 MED ORDER — CHLORHEXIDINE GLUCONATE 0.12 % MT SOLN
OROMUCOSAL | Status: AC
  Filled 2020-04-24: qty 15

## 2020-04-24 MED ORDER — CHLORHEXIDINE GLUCONATE 0.12 % MT SOLN
15.0000 mL | Freq: Once | OROMUCOSAL | Status: AC
  Administered 2020-04-24: 15 mL via OROMUCOSAL

## 2020-04-24 MED ORDER — ONDANSETRON HCL 4 MG/2ML IJ SOLN: 4.0000 mg | Freq: Once | INTRAMUSCULAR | Status: DC | PRN

## 2020-04-24 MED ORDER — FENTANYL CITRATE (PF) 100 MCG/2ML IJ SOLN
INTRAMUSCULAR | Status: AC
  Filled 2020-04-24: qty 2

## 2020-04-24 MED ORDER — ONDANSETRON HCL 4 MG/2ML IJ SOLN
INTRAMUSCULAR | Status: DC | PRN
  Administered 2020-04-24: 4 mg via INTRAVENOUS

## 2020-04-24 MED ORDER — IOHEXOL 180 MG/ML  SOLN
INTRAMUSCULAR | Status: DC | PRN
  Administered 2020-04-24: 20 mL

## 2020-04-24 MED ORDER — OXYCODONE HCL 5 MG PO TABS: 5.0000 mg | ORAL_TABLET | Freq: Once | ORAL | Status: DC | PRN

## 2020-04-24 MED ORDER — BELLADONNA ALKALOIDS-OPIUM 16.2-60 MG RE SUPP
RECTAL | Status: AC
  Filled 2020-04-24: qty 1

## 2020-04-24 MED ORDER — LIDOCAINE HCL (PF) 2 % IJ SOLN
INTRAMUSCULAR | Status: AC
  Filled 2020-04-24: qty 5

## 2020-04-24 MED ORDER — OXYCODONE HCL 5 MG/5ML PO SOLN: 5.0000 mg | Freq: Once | ORAL | Status: DC | PRN

## 2020-04-24 MED ORDER — PROPOFOL 10 MG/ML IV BOLUS
INTRAVENOUS | Status: DC | PRN
  Administered 2020-04-24: 120 mg via INTRAVENOUS
  Administered 2020-04-24: 20 mg via INTRAVENOUS

## 2020-04-24 MED ORDER — FENTANYL CITRATE (PF) 100 MCG/2ML IJ SOLN: 25.0000 ug | INTRAMUSCULAR | Status: DC | PRN

## 2020-04-24 MED ORDER — LACTATED RINGERS IV SOLN: INTRAVENOUS | Status: DC

## 2020-04-24 MED ORDER — ROCURONIUM BROMIDE 10 MG/ML (PF) SYRINGE
PREFILLED_SYRINGE | INTRAVENOUS | Status: AC
  Filled 2020-04-24: qty 10

## 2020-04-24 MED ORDER — CEFAZOLIN SODIUM-DEXTROSE 2-4 GM/100ML-% IV SOLN
INTRAVENOUS | Status: AC
  Filled 2020-04-24: qty 100

## 2020-04-24 MED ORDER — CEFAZOLIN SODIUM-DEXTROSE 2-4 GM/100ML-% IV SOLN
2.0000 g | INTRAVENOUS | Status: AC
  Administered 2020-04-24: 2 g via INTRAVENOUS

## 2020-04-24 MED ORDER — ORAL CARE MOUTH RINSE: 15.0000 mL | Freq: Once | OROMUCOSAL | Status: AC

## 2020-04-24 MED ORDER — ONDANSETRON HCL 4 MG/2ML IJ SOLN
INTRAMUSCULAR | Status: AC
  Filled 2020-04-24: qty 2

## 2020-04-24 MED ORDER — PROPOFOL 10 MG/ML IV BOLUS
INTRAVENOUS | Status: AC
  Filled 2020-04-24: qty 20

## 2020-04-24 MED ORDER — KETOROLAC TROMETHAMINE 30 MG/ML IJ SOLN
INTRAMUSCULAR | Status: AC
  Filled 2020-04-24: qty 1

## 2020-04-24 MED ORDER — DEXAMETHASONE SODIUM PHOSPHATE 10 MG/ML IJ SOLN
INTRAMUSCULAR | Status: AC
  Filled 2020-04-24: qty 1

## 2020-04-24 MED ORDER — LIDOCAINE HCL (CARDIAC) PF 100 MG/5ML IV SOSY
PREFILLED_SYRINGE | INTRAVENOUS | Status: DC | PRN
  Administered 2020-04-24: 80 mg via INTRAVENOUS

## 2020-04-24 SURGICAL SUPPLY — 23 items
BALLN NEPHROMAX 24X8X12 (CATHETERS)
BALLN OPTILUME DCB 30X5X75 (BALLOONS) ×2
BALLN URETL DIL 7X10 (BALLOONS)
BALLOON NEPHROMAX 24X8X12 (CATHETERS) IMPLANT
BALLOON OPTILUME DCB 30X5X75 (BALLOONS) ×1 IMPLANT
BALLOON URETL DIL 7X10 (BALLOONS) IMPLANT
BASIN GRAD PLASTIC 32OZ STRL (MISCELLANEOUS) ×2 IMPLANT
CATH FOL 2WAY LX 16X5 (CATHETERS) IMPLANT
CATH FOLEY 2W COUNCIL 5CC 16FR (CATHETERS) ×2 IMPLANT
ELECT REM PT RETURN 9FT ADLT (ELECTROSURGICAL)
ELECTRODE REM PT RTRN 9FT ADLT (ELECTROSURGICAL) IMPLANT
GLOVE SURG UNDER POLY LF SZ7.5 (GLOVE) ×2 IMPLANT
GOWN STRL REUS W/ TWL XL LVL3 (GOWN DISPOSABLE) ×1 IMPLANT
GOWN STRL REUS W/TWL XL LVL3 (GOWN DISPOSABLE) ×1
KIT BALLN UROMAX 15FX4 (MISCELLANEOUS) ×1 IMPLANT
KIT BALLN UROMAX 26 75X4 (MISCELLANEOUS) ×1
MANIFOLD NEPTUNE II (INSTRUMENTS) ×2 IMPLANT
PACK CYSTO AR (MISCELLANEOUS) ×2 IMPLANT
SET CYSTO W/LG BORE CLAMP LF (SET/KITS/TRAYS/PACK) ×2 IMPLANT
SYR 10ML LL (SYRINGE) ×2 IMPLANT
SYR 30ML LL (SYRINGE) ×2 IMPLANT
Urethral Drug Coated Balloon Catheter ×2 IMPLANT
WATER STERILE IRR 1000ML POUR (IV SOLUTION) ×2 IMPLANT

## 2020-04-24 NOTE — Op Note (Signed)
Date of procedure: 04/24/20  Preoperative diagnosis:  1. Bulbourethral stricture  Postoperative diagnosis:  1. Same  Procedure: 1. Retrograde urethrogram 2. Cystoscopy and balloon dilation with Optilume balloon  Surgeon: Nickolas Madrid, MD  Anesthesia: General  Complications: None  Intraoperative findings:  1.  Retrograde showed a ~1cm bulbourethral stricture, ~36F 2.  Uncomplicated balloon dilation with Optilume balloon for 5 minutes 3.  Benjamin passed easily over wire  EBL: Minimal  Specimens: None  Drains: 16 [place] council Foley  Indication: Xavier [name] is a 70 y.o. patient with history of urethral stricture who has had a recurrence on clinic cystoscopy with PVRs greater than 400 mL and overflow incontinence.  After reviewing the management options for treatment, they elected to proceed with the above surgical procedure(s). We have discussed the potential benefits and risks of the procedure, side effects of the proposed treatment, the likelihood of the patient achieving the goals of the procedure, and any potential problems that might occur during the procedure or recuperation. Informed consent has been obtained.  Description of procedure:  The patient was taken to the operating room and general anesthesia was induced. SCDs were placed for DVT prophylaxis.  Preoperative antibiotics were given and a timeout was performed.  He was prepped with Betadine at the meatus.  The patient was partially bumped to the right side, and a retrograde urethrogram showed a ~1 centimeter bulbourethral stricture measuring about 8 [place].  The patient was then completely prepped and draped in standard sterile fashion dorsal lithotomy.  A 21 French rigid cystoscope was used to intubate the urethra and a normal-appearing urethra was followed proximally towards the bladder.  An approximately 8 French urethral stricture was noted in the bulbar urethra.  I started by advancing a sensor  wire into the bladder, and a UroMax 24 [place] by 4 cm dilating balloon was used to open the stricture at a pressure of 20 ATM for 1 minute.  The cystoscope then passed easily alongside the wire up into the bladder.  The prostate was small in size, and the bladder was notable for mild to moderate trabeculations.  No suspicious lesions.  The 2 French X 5 cm Optilume urethral dilating balloon was advanced over the wire and under direct vision positioned across the stricture.  This was inflated to 10 ATM and left in place for 5 minutes.  The balloon was deflated and removed.  A 16 [place] council Foley passed easily over the wire with return of clear fluid, and 10 cc were placed in the balloon.  Disposition: Stable to PACU  Plan: Foley removal on Monday, close follow-up long-term for recurrence of stricture Will need repeat PSA in 6 weeks after treatment of acute UTI  Nickolas Madrid, MD

## 2020-04-24 NOTE — H&P (Signed)
04/24/20 2:41 PM   Xavier [name] 18-Aug-1950 [id number]  CC: Urethral stricture  HPI: Mr. Xavier [name] is a 70 year old male sent over for an increase in his PSA to 12.5.  He has a complex urologic history.  He was originally seen by me in March 2021 with a significantly elevated PVR 450 mL and overflow incontinence.  On cystoscopy he was found to have a pinpoint bulbar urethral stricture that appeared short in length.  He underwent balloon dilation and DVIU with my partner Dr. Bernardo Heater on 04/17/2019 in a joint procedure with general surgery for a hernia repair.  His Foley was removed 2 days postop, but he did not follow-up at the recommended 6-week visit.  He reports worsening of his urinary symptoms over the last 6 months with weak stream, straining to urinate, feeling of incomplete emptying, and overflow incontinence requiring a depends.  Urinalysis grossly concerning for infection and will send for culture.  PVR significantly elevated at greater than 500 mL.  On exam his lower abdomen is distended.  DRE today 20 g prostate, prominent right sided lobe, but no definite masses or nodules.  Normal-appearing meatus.  Unfortunately, I suspect he has had a recurrence of his bulbourethral stricture with incomplete emptying and a possible UTI that may be responsible for his elevated PSA.  We discussed other etiologies like prostate cancer.   PMH: Past Medical History:  Diagnosis Date  . Family hx of colon cancer requiring screening colonoscopy 06/21/2016   Father at age 25  . History of CVA (cerebrovascular accident) 06/22/2011   Overview:  right sided stroke in 2013 (basal ganglia, right insular region, and white matter of the left frontal lobe   . Hyperlipidemia   . Hypertension   . Stroke [facility]) 2013    Surgical History: Past Surgical History:  Procedure Laterality Date  . CYSTOSCOPY WITH RETROGRADE URETHROGRAM N/A 04/17/2019   Procedure: CYSTOSCOPY WITH BALLOON DILATION OF URETHRAL  STRICTURE;  Surgeon: Abbie Sons, MD;  Location: [facility] ORS;  Service: Urology;  Laterality: N/A;  . TONSILLECTOMY  1958  . TONSILLECTOMY    . UMBILICAL HERNIA REPAIR N/A 04/17/2019   Procedure: HERNIA REPAIR UMBILICAL ADULT;  Surgeon: Olean Ree, MD;  Location: [facility] ORS;  Service: General;  Laterality: N/A;  . URETHROTOMY N/A 04/17/2019   Procedure: DIRECT VISION INTERNAL URETHROTOMY;  Surgeon: Abbie Sons, MD;  Location: [facility] ORS;  Service: Urology;  Laterality: N/A;      Family History: Family History  Problem Relation Age of Onset  . Arthritis Mother   . Kidney disease Mother   . Heart disease Father   . Cancer Father        lung, colon  . Diabetes Brother   . Cancer Brother        stomach tumor  . Glaucoma Maternal Grandmother   . Kidney disease Paternal Grandmother   . Neuropathy Brother     Social History:  reports that he has never smoked. He has never used smokeless tobacco. He reports current alcohol use. He reports that he does not use drugs.  Physical Exam: BP (!) 145/98   Pulse 85   Temp 98.7 F (37.1 C) (Temporal)   Resp 16   SpO2 98%    Constitutional:  Alert and oriented, No acute distress. Cardiovascular: Regular rate and rhythm Respiratory: Clear to auscultation bilaterally GI: Abdomen is soft, nontender, nondistended, no abdominal masses  Laboratory Data: Urinalysis suspicious for infection, has been on Bactrim and culture pending  Assessment & Plan:   70 year old male with elevated PSA and recurrence of bulbourethral stricture with incomplete emptying, overflow incontinence, and acute UTI earlier this week.  Has been on Bactrim and here today for balloon dilation with new Optilume coated urethral dilating balloon.  Cystoscopy and balloon dilation today  Xavier Madrid, MD 04/24/2020  [facility] [address], [place], [place] [address] [phone]

## 2020-04-24 NOTE — Anesthesia Procedure Notes (Signed)
Procedure Name: LMA Insertion Date/Time: 04/24/2020 3:17 PM Performed by: Zetta Bills, CRNA Pre-anesthesia Checklist: Patient identified, Emergency Drugs available, Suction available and Patient being monitored Patient Re-evaluated:Patient Re-evaluated prior to induction Oxygen Delivery Method: Circle system utilized Preoxygenation: Pre-oxygenation with 100% oxygen Induction Type: IV induction Ventilation: Mask ventilation without difficulty LMA: LMA inserted LMA Size: 4.0 Number of attempts: 1 Placement Confirmation: ETT inserted through vocal cords under direct vision Tube secured with: Tape Dental Injury: Teeth and Oropharynx as per pre-operative assessment

## 2020-04-24 NOTE — Progress Notes (Signed)
EKG on chart, Dr. Lubertha Basque aware

## 2020-04-24 NOTE — Transfer of Care (Signed)
Immediate Anesthesia Transfer of Care Note  Patient: Xavier [name]  Procedure(s) Performed: CYSTOSCOPY WITH URETHRAL BALLOON DILATATION (N/A )  Patient Location: PACU  Anesthesia Type:General  Level of Consciousness: awake  Airway & Oxygen Therapy: Patient Spontanous Breathing  Post-op Assessment: Report given to RN  Post vital signs: stable  Last Vitals:  Vitals Value Taken Time  BP 138/85 04/24/20 1600  Temp    Pulse 78 04/24/20 1601  Resp 16 04/24/20 1601  SpO2 98 % 04/24/20 1601  Vitals shown include unvalidated device data.  Last Pain:  Vitals:   04/24/20 1251  TempSrc: Temporal  PainSc: 0-No pain         Complications: No complications documented.

## 2020-04-24 NOTE — Anesthesia Preprocedure Evaluation (Addendum)
Anesthesia Evaluation  Patient identified by MRN, date of birth, ID band Patient awake    Reviewed: Allergy & Precautions, H&P , NPO status , Patient's Chart, lab work & pertinent test results  History of Anesthesia Complications Negative for: history of anesthetic complications  Airway Mallampati: II  TM Distance: >3 FB     Dental  (+) Upper Dentures   Pulmonary neg pulmonary ROS, neg sleep apnea, neg COPD,    breath sounds clear to auscultation       Cardiovascular hypertension, (-) angina(-) Past MI and (-) Cardiac Stents (-) dysrhythmias  Rhythm:regular Rate:Normal     Neuro/Psych CVA (right hand weakness), Residual Symptoms negative psych ROS   GI/Hepatic negative GI ROS, Neg liver ROS,   Endo/Other  negative endocrine ROS  Renal/GU      Musculoskeletal   Abdominal   Peds  Hematology negative hematology ROS (+)   Anesthesia Other Findings Past Medical History: 06/21/2016: Family hx of colon cancer requiring screening colonoscopy     Comment:  Father at age 27 06/22/2011: History of CVA (cerebrovascular accident)     Comment:  Overview:  right sided stroke in 2013 (basal ganglia,               right insular region, and white matter of the left               frontal lobe  No date: Hyperlipidemia No date: Hypertension 2013: Stroke [facility])  Past Surgical History: 04/17/2019: CYSTOSCOPY WITH RETROGRADE URETHROGRAM; N/A     Comment:  Procedure: CYSTOSCOPY WITH BALLOON DILATION OF URETHRAL               STRICTURE;  Surgeon: Abbie Sons, MD;  Location:               [facility] ORS;  Service: Urology;  Laterality: N/A; 1958: TONSILLECTOMY No date: TONSILLECTOMY 6/0/7371: UMBILICAL HERNIA REPAIR; N/A     Comment:  Procedure: HERNIA REPAIR UMBILICAL ADULT;  Surgeon:               Olean Ree, MD;  Location: [facility] ORS;  Service:               General;  Laterality: N/A; 04/17/2019: URETHROTOMY; N/A     Comment:   Procedure: DIRECT VISION INTERNAL URETHROTOMY;  Surgeon:              Abbie Sons, MD;  Location: [facility] ORS;  Service:               Urology;  Laterality: N/A;     Reproductive/Obstetrics negative OB ROS                            Anesthesia Physical Anesthesia Plan  ASA: II  Anesthesia Plan: General LMA   Post-op Pain Management:    Induction:   PONV Risk Score and Plan: Dexamethasone, Ondansetron and Treatment may vary due to age or medical condition  Airway Management Planned:   Additional Equipment:   Intra-op Plan:   Post-operative Plan:   Informed Consent: I have reviewed the patients History and Physical, chart, labs and discussed the procedure including the risks, benefits and alternatives for the proposed anesthesia with the patient or authorized representative who has indicated his/her understanding and acceptance.     Dental Advisory Given  Plan Discussed with: Anesthesiologist, CRNA and Surgeon  Anesthesia Plan Comments:         Anesthesia  Quick Evaluation

## 2020-04-24 NOTE — Discharge Instructions (Signed)
AMBULATORY SURGERY  DISCHARGE INSTRUCTIONS   1) The drugs that you were given will stay in your system until tomorrow so for the next 24 hours you should not:  A) Drive an automobile B) Make any legal decisions C) Drink any alcoholic beverage   2) You may resume regular meals tomorrow.  Today it is better to start with liquids and gradually work up to solid foods.  You may eat anything you prefer, but it is better to start with liquids, then soup and crackers, and gradually work up to solid foods.   3) Please notify your doctor immediately if you have any unusual bleeding, trouble breathing, redness and pain at the surgery site, drainage, fever, or pain not relieved by medication.     Please contact your physician with any problems or Same Day Surgery at [phone], Monday through Friday 6 am to 4 pm, or [place] at [facility] number at [phone].   Indwelling Urinary Catheter Care, Adult An indwelling urinary catheter is a thin tube that is put into your bladder. The tube helps to drain pee (urine) out of your body. The tube goes in through your urethra. Your urethra is where pee comes out of your body. Your pee will come out through the catheter, then it will go into a bag (drainage bag). Take good care of your catheter so it will work well. How to wear your catheter and bag Supplies needed  Sticky tape (adhesive tape) or a leg strap.  Alcohol wipe or soap and water (if you use tape).  A clean towel (if you use tape).  Large overnight bag.  Smaller bag (leg bag). Wearing your catheter Attach your catheter to your leg with tape or a leg strap.  Make sure the catheter is not pulled tight.  If a leg strap gets wet, take it off and put on a dry strap.  If you use tape to hold the bag on your leg: 1. Use an alcohol wipe or soap and water to wash your skin where the tape made it sticky before. 2. Use a clean towel to pat-dry that skin. 3. Use new tape to make  the bag stay on your leg. Wearing your bags You should have been given a large overnight bag.  You may wear the overnight bag in the day or night.  Always have the overnight bag lower than your bladder.  Do not let the bag touch the floor.  Before you go to sleep, put a clean plastic bag in a wastebasket. Then hang the overnight bag inside the wastebasket. You should also have a smaller leg bag that fits under your clothes.  Always wear the leg bag below your knee.  Do not wear your leg bag at night. How to care for your skin and catheter Supplies needed  A clean washcloth.  Water and mild soap.  A clean towel. Caring for your skin and catheter  Clean the skin around your catheter every day: 1. Wash your hands with soap and water. 2. Wet a clean washcloth in warm water and mild soap. 3. Clean the skin around your urethra.  If you are male:  Gently spread the folds of skin around your vagina (labia).  With the washcloth in your other hand, wipe the inner side of your labia on each side. Wipe from front to back.  If you are male:  Pull back any skin that covers the end of your penis (foreskin).  With the washcloth in  your other hand, wipe your penis in small circles. Start wiping at the tip of your penis, then move away from the catheter.  Move the foreskin back in place, if needed. 4. With your free hand, hold the catheter close to where it goes into your body.  Keep holding the catheter during cleaning so it does not get pulled out. 5. With the washcloth in your other hand, clean the catheter.  Only wipe downward on the catheter.  Do not wipe upward toward your body. Doing this may push germs into your urethra and cause infection. 6. Use a clean towel to pat-dry the catheter and the skin around it. Make sure to wipe off all soap. 7. Wash your hands with soap and water.  Shower every day. Do not take baths.  Do not use cream, ointment, or lotion on the area  where the catheter goes into your body, unless your doctor tells you to.  Do not use powders, sprays, or lotions on your genital area.  Check your skin around the catheter every day for signs of infection. Check for: ? Redness, swelling, or pain. ? Fluid or blood. ? Warmth. ? Pus or a bad smell.      How to empty the bag Supplies needed  Rubbing alcohol.  Gauze pad or cotton ball.  Tape or a leg strap. Emptying the bag Pour the pee out of your bag when it is ?- full, or at least 2-3 times a day. Do this for your overnight bag and your leg bag. 1. Wash your hands with soap and water. 2. Separate (detach) the bag from your leg. 3. Hold the bag over the toilet or a clean pail. Keep the bag lower than your hips and bladder. This is so the pee (urine) does not go back into the tube. 4. Open the pour spout. It is at the bottom of the bag. 5. Empty the pee into the toilet or pail. Do not let the pour spout touch any surface. 6. Put rubbing alcohol on a gauze pad or cotton ball. 7. Use the gauze pad or cotton ball to clean the pour spout. 8. Close the pour spout. 9. Attach the bag to your leg with tape or a leg strap. 10. Wash your hands with soap and water. Follow instructions for cleaning the drainage bag:  From the product maker.  As told by your doctor. How to change the bag Supplies needed  Alcohol wipes.  A clean bag.  Tape or a leg strap. Changing the bag Replace your bag when it starts to leak, smell bad, or look dirty. 1. Wash your hands with soap and water. 2. Separate the dirty bag from your leg. 3. Pinch the catheter with your fingers so that pee does not spill out. 4. Separate the catheter tube from the bag tube where these tubes connect (at the connection valve). Do not let the tubes touch any surface. 5. Clean the end of the catheter tube with an alcohol wipe. Use a different alcohol wipe to clean the end of the bag tube. 6. Connect the catheter tube to the  tube of the clean bag. 7. Attach the clean bag to your leg with tape or a leg strap. Do not make the bag tight on your leg. 8. Wash your hands with soap and water. General rules  Never pull on your catheter. Never try to take it out. Doing that can hurt you.  Always wash your hands before and after you  touch your catheter or bag. Use a mild, fragrance-free soap. If you do not have soap and water, use hand sanitizer.  Always make sure there are no twists or bends (kinks) in the catheter tube.  Always make sure there are no leaks in the catheter or bag.  Drink enough fluid to keep your pee pale yellow.  Do not take baths, swim, or use a hot tub.  If you are male, wipe from front to back after you poop (have a bowel movement).   Contact a doctor if:  Your pee is cloudy.  Your pee smells worse than usual.  Your catheter gets clogged.  Your catheter leaks.  Your bladder feels full. Get help right away if:  You have redness, swelling, or pain where the catheter goes into your body.  You have fluid, blood, pus, or a bad smell coming from the area where the catheter goes into your body.  Your skin feels warm where the catheter goes into your body.  You have a fever.  You have pain in your: ? Belly (abdomen). ? Legs. ? Lower back. ? Bladder.  You see blood in the catheter.  Your pee is pink or red.  You feel sick to your stomach (nauseous).  You throw up (vomit).  You have chills.  Your pee is not draining into the bag.  Your catheter gets pulled out. Summary  An indwelling urinary catheter is a thin tube that is placed into the bladder to help drain pee (urine) out of the body.  The catheter is placed into the part of the body that drains pee from the bladder (urethra).  Taking good care of your catheter will keep it working properly and help prevent problems.  Always wash your hands before and after touching your catheter or bag.  Never pull on your  catheter or try to take it out. This information is not intended to replace advice given to you by your health care provider. Make sure you discuss any questions you have with your health care provider. Document Revised: 04/20/2018 Document Reviewed: 08/12/2016 Elsevier Patient Education  [place].

## 2020-04-27 ENCOUNTER — Encounter: Payer: Self-pay | Admitting: Physician Assistant

## 2020-04-27 ENCOUNTER — Ambulatory Visit (INDEPENDENT_AMBULATORY_CARE_PROVIDER_SITE_OTHER): Payer: Medicare HMO | Admitting: Physician Assistant

## 2020-04-27 ENCOUNTER — Ambulatory Visit: Payer: Medicare HMO | Admitting: Physician Assistant

## 2020-04-27 ENCOUNTER — Other Ambulatory Visit: Payer: Self-pay

## 2020-04-27 VITALS — BP 142/92 | HR 90 | Ht 63.0 in | Wt 158.0 lb

## 2020-04-27 DIAGNOSIS — N35912 Unspecified bulbous urethral stricture, male: Secondary | ICD-10-CM | POA: Diagnosis not present

## 2020-04-27 LAB — BLADDER SCAN AMB NON-IMAGING

## 2020-04-27 NOTE — Progress Notes (Signed)
Afternoon follow-up  Patient returned to clinic this afternoon for repeat PVR. He reports drinking approximately 30oz of fluid. He has not been able to urinate. PVR >975mL.  Results for orders placed or performed in visit on 04/27/20  BLADDER SCAN AMB NON-IMAGING  Result Value Ref Range   Scan Result >960mL     Voiding trial failed. Patient reports postop constipation as well. I offered him Foley replacement with repeat voiding trial in 1 week versus CIC teaching today and he elected for the former, see procedure note below. Counseled him to start Miralax for management of constipation.  Simple Catheter Placement  Due to urinary retention patient is present today for a foley cath placement.  Patient was cleaned and prepped in a sterile fashion with betadine and 2% lidocaine jelly was instilled into the urethra. A 16 FR foley catheter was inserted, urine return was noted  945ml, urine was yellow in color.  The balloon was filled with 10cc of sterile water.  A leg bag was attached for drainage. Patient was also given a night bag to take home and was given instruction on how to change from one bag to another.  Patient was given instruction on proper catheter care.  Patient tolerated well, no complications were noted   Performed by: Debroah Loop, PA-C   Follow up: Return in about 1 week (around 05/04/2020) for 1 week V&T .

## 2020-04-27 NOTE — Progress Notes (Signed)
Catheter Removal  Patient is present today for a catheter removal.  51ml of water was drained from the balloon. A 16FR council tip foley cath was removed from the bladder no complications were noted . Patient tolerated well.  Performed by: Debroah Loop, PA-C   Follow up/ Additional notes: Push fluids and RTC this afternoon for PVR.

## 2020-04-27 NOTE — Telephone Encounter (Signed)
error 

## 2020-04-27 NOTE — Patient Instructions (Signed)
Please start the over-the-counter laxative Miralax for management of your postop constipation. You may take one capful of powder mixed into a beverage of your choice daily.

## 2020-04-28 LAB — CULTURE, URINE COMPREHENSIVE

## 2020-04-28 NOTE — Anesthesia Postprocedure Evaluation (Signed)
Anesthesia Post Note  Patient: Xavier [name]  Procedure(s) Performed: CYSTOSCOPY WITH URETHRAL BALLOON DILATATION (N/A )  Patient location during evaluation: PACU Anesthesia Type: General Level of consciousness: awake and alert Pain management: pain level controlled Vital Signs Assessment: post-procedure vital signs reviewed and stable Respiratory status: spontaneous breathing, nonlabored ventilation, respiratory function stable and patient connected to nasal cannula oxygen Cardiovascular status: blood pressure returned to baseline and stable Postop Assessment: no apparent nausea or vomiting Anesthetic complications: no   No complications documented.   Last Vitals:  Vitals:   04/24/20 1630 04/24/20 1646  BP: (!) 153/90 (!) 164/85  Pulse:  70  Resp:  18  Temp: (!) 36.2 C 36.6 C  SpO2:  99%    Last Pain:  Vitals:   04/24/20 1646  TempSrc: Temporal  PainSc:                  Molli Barrows

## 2020-04-29 ENCOUNTER — Telehealth: Payer: Self-pay

## 2020-04-29 DIAGNOSIS — B952 Enterococcus as the cause of diseases classified elsewhere: Secondary | ICD-10-CM

## 2020-04-29 DIAGNOSIS — N390 Urinary tract infection, site not specified: Secondary | ICD-10-CM

## 2020-04-29 MED ORDER — NITROFURANTOIN MONOHYD MACRO 100 MG PO CAPS: 100.0000 mg | ORAL_CAPSULE | Freq: Two times a day (BID) | ORAL | 0 refills | Status: AC

## 2020-04-29 NOTE — Telephone Encounter (Signed)
Called all numbers listed in chart. All state unavailable. RX sent in. 1st attempt.

## 2020-04-29 NOTE — Telephone Encounter (Signed)
-----   Message from Billey Co, MD sent at 04/29/2020  8:31 AM EDT ----- Arloa Koh do nitrofurantoin 100 mg twice daily x5 days for positive urine culture, he can stop the Bactrim if he is still on it  Nickolas Madrid, MD 04/29/2020

## 2020-05-01 NOTE — Telephone Encounter (Signed)
Called pt's home number no answer. Unable to leave message.

## 2020-05-05 ENCOUNTER — Ambulatory Visit: Payer: Self-pay | Admitting: Physician Assistant

## 2020-05-05 ENCOUNTER — Other Ambulatory Visit: Payer: Self-pay

## 2020-05-05 ENCOUNTER — Ambulatory Visit (INDEPENDENT_AMBULATORY_CARE_PROVIDER_SITE_OTHER): Payer: Medicare HMO | Admitting: Physician Assistant

## 2020-05-05 DIAGNOSIS — W57XXXA Bitten or stung by nonvenomous insect and other nonvenomous arthropods, initial encounter: Secondary | ICD-10-CM

## 2020-05-05 DIAGNOSIS — S70361A Insect bite (nonvenomous), right thigh, initial encounter: Secondary | ICD-10-CM

## 2020-05-05 DIAGNOSIS — N35912 Unspecified bulbous urethral stricture, male: Secondary | ICD-10-CM | POA: Diagnosis not present

## 2020-05-05 LAB — BLADDER SCAN AMB NON-IMAGING

## 2020-05-05 NOTE — Patient Instructions (Signed)

## 2020-05-05 NOTE — Progress Notes (Signed)
Catheter Removal  Patient is present today for a catheter removal.  70ml of water was drained from the balloon. A 16FR foley cath was removed from the bladder no complications were noted . Patient tolerated well.  Performed by: Debroah Loop, PA-C  Follow up/ Additional notes: Push fluids, void hourly, and RTC this afternoon for PVR.

## 2020-05-05 NOTE — Progress Notes (Addendum)
Afternoon follow-up  Patient returned to clinic this afternoon for repeat PVR. He reports drinking approximately 48oz of fluid. He has been able to urinate. PVR >614mL.  He reports abdominal distention without discomfort.  Results for orders placed or performed in visit on 05/05/20  Bladder Scan (Post Void Residual) in office  Result Value Ref Range   Scan Result >645mL     Voiding trial failed.  I again offered the patient CIC teaching versus Foley catheter replacement today.  He elected for the former.  Continuous Intermittent Catheterization  Due to incomplete bladder emptying patient is present today for a teaching of self I & O Catheterization. Patient was given detailed verbal and printed instructions of self catheterization. Patient was cleaned and prepped in a sterile fashion.  With instruction and assistance patient inserted a 12FR coude [employer] SpeediCath Standard and urine return was noted 1100 ml, urine was yellow in color. Patient tolerated well, no complications were noted Patient was given a sample bag with supplies to take home.  Instructions were given for patient to cath 3 times daily.  An order was placed with [facility] for catheters to be sent to the patient's home.   Performed by: Debroah Loop, PA-C   Follow up: Return in about 6 weeks (around 06/16/2020) for Symptom recheck with PVR with Dr. Diamantina Providence.   Tick Removal  Incidentally, while prepping the patient for CIC teaching, I noticed a tick attached to his anterior right thigh lateral to the scrotum.  I removed the tick in its entirety using a removal tool in clinic today.  There was mild erythema surrounding the attachment site without an apparent bull's-eye rash.  I counseled the patient to follow-up with his PCP if he develops a bull's-eye rash, fever, chills, or body aches.  He expressed understanding.

## 2020-05-07 ENCOUNTER — Telehealth: Payer: Self-pay | Admitting: Internal Medicine

## 2020-05-07 NOTE — Telephone Encounter (Signed)
   Telephone encounter was:  Unsuccessful.  05/07/2020 Name: Xavier [name] MRN: [id number] DOB: 04-15-1950  Unsuccessful outbound call made today to assist with:  Transportation Needs   Outreach Attempt:  2nd Attempt  Could not leave a message on the machine.   [place], Care Management Phone: [phone] Email: [email or web address]

## 2020-05-18 ENCOUNTER — Telehealth: Payer: Self-pay | Admitting: Internal Medicine

## 2020-05-18 NOTE — Telephone Encounter (Signed)
   Telephone encounter was:  Unsuccessful.  05/18/2020 Name: Xavier [name] MRN: [id number] DOB: 04/23/1950  Unsuccessful outbound call made today to assist with:  Transportation Needs   Outreach Attempt:  3rd Attempt.  Referral closed unable to contact patient.  Could not leave a message.   [place], Care Management Phone: [phone] Email: [email or web address]

## 2020-05-28 DIAGNOSIS — R339 Retention of urine, unspecified: Secondary | ICD-10-CM | POA: Diagnosis not present

## 2020-06-24 ENCOUNTER — Encounter: Payer: Self-pay | Admitting: Urology

## 2020-07-08 ENCOUNTER — Other Ambulatory Visit: Payer: Self-pay

## 2020-07-08 ENCOUNTER — Encounter: Payer: Self-pay | Admitting: Urology

## 2020-07-08 ENCOUNTER — Ambulatory Visit (INDEPENDENT_AMBULATORY_CARE_PROVIDER_SITE_OTHER): Payer: Medicare HMO | Admitting: Urology

## 2020-07-08 VITALS — BP 139/81 | HR 76 | Ht 64.0 in | Wt 153.0 lb

## 2020-07-08 DIAGNOSIS — N138 Other obstructive and reflux uropathy: Secondary | ICD-10-CM

## 2020-07-08 DIAGNOSIS — N401 Enlarged prostate with lower urinary tract symptoms: Secondary | ICD-10-CM

## 2020-07-08 DIAGNOSIS — N312 Flaccid neuropathic bladder, not elsewhere classified: Secondary | ICD-10-CM

## 2020-07-08 DIAGNOSIS — N35912 Unspecified bulbous urethral stricture, male: Secondary | ICD-10-CM | POA: Diagnosis not present

## 2020-07-08 DIAGNOSIS — R972 Elevated prostate specific antigen [PSA]: Secondary | ICD-10-CM

## 2020-07-08 LAB — BLADDER SCAN AMB NON-IMAGING

## 2020-07-08 NOTE — Progress Notes (Signed)
   07/08/2020 10:50 AM   Xavier [name] 11-02-1950 [id number]  Reason for visit: Follow up urethral stricture, atonic bladder, elevated PSA  HPI: 70 year old male seen for the above issues.  Briefly, he has had significantly elevated PVRs and incomplete emptying despite dilation of 1 cm  bulbourethral stricture, most recently with Optilume balloon on 04/24/2020.  He has been unable to empty with PVRs greater than 800 mL, and most recently was started on CIC 3 times per day at the end of April 2022.  He is doing relatively well with this, but is only catheterizing twice per day.  He is only having small voids during the day in between catheterizations during a bowel movement, but he is not having any overflow incontinence like prior.  We discussed his likely atonic bladder in the setting of a chronic urethral stricture, and need for long-term intermittent catheterizations.  I encouraged him to catheterize 3 times per day to prevent overfilling, upstream pressure in the kidneys, or recurrent infections.  He also has a history of an elevated PSA as high as 12, but this was at the time of acute retention and UTI.  We will repeat PSA today with reflex to free.  We discussed possible need for biopsy or further evaluation in the future.  Renal function was normal with creatinine 0.98, EGFR greater than 60 in April 2022.  Last PSA was elevated at 12.5 two months ago at time of retention and acute infection.  Continue CIC 3 times per day for atonic bladder with history of urethral stricture Call with PSA results RTC 3 months with BMP and renal ultrasound prior  Billey Co, MD  [facility] [address] [place], [place] [address] [phone]

## 2020-07-09 LAB — PSA TOTAL (REFLEX TO FREE): Prostate Specific Ag, Serum: 17.4 ng/mL — ABNORMAL HIGH (ref 0.0–4.0)

## 2020-07-14 ENCOUNTER — Telehealth: Payer: Self-pay

## 2020-07-14 DIAGNOSIS — R972 Elevated prostate specific antigen [PSA]: Secondary | ICD-10-CM

## 2020-07-14 NOTE — Telephone Encounter (Signed)
Called pt's work number. No answer. Unable to leave message as no VM is set up. Called pt's mobile number. No answer. 1st attempt.

## 2020-07-14 NOTE — Telephone Encounter (Signed)
Spoke with patient and advised results  Entered order for MRI of the prostate

## 2020-07-14 NOTE — Telephone Encounter (Signed)
-----   Message from Billey Co, MD sent at 07/14/2020 10:50 AM EDT ----- PSA remains elevated, this may just be from him catheterizing, but would recommend a prostate MRI for further evaluation and rule out prostate cancer, and we will call with those results   Nickolas Madrid, MD 07/14/2020

## 2020-08-07 ENCOUNTER — Ambulatory Visit
Admission: RE | Admit: 2020-08-07 | Discharge: 2020-08-07 | Disposition: A | Discharge status: Home / Self Care | Payer: Medicare HMO | Source: Ambulatory Visit | Attending: Urology | Admitting: Urology

## 2020-08-07 ENCOUNTER — Other Ambulatory Visit: Payer: Self-pay

## 2020-08-07 ENCOUNTER — Telehealth: Payer: Self-pay | Admitting: Internal Medicine

## 2020-08-07 DIAGNOSIS — R300 Dysuria: Secondary | ICD-10-CM | POA: Diagnosis not present

## 2020-08-07 DIAGNOSIS — R972 Elevated prostate specific antigen [PSA]: Secondary | ICD-10-CM | POA: Diagnosis not present

## 2020-08-07 DIAGNOSIS — N320 Bladder-neck obstruction: Secondary | ICD-10-CM | POA: Diagnosis not present

## 2020-08-07 DIAGNOSIS — N402 Nodular prostate without lower urinary tract symptoms: Secondary | ICD-10-CM | POA: Diagnosis not present

## 2020-08-07 MED ORDER — GADOBUTROL 1 MMOL/ML IV SOLN
7.0000 mL | Freq: Once | INTRAVENOUS | Status: AC | PRN
  Administered 2020-08-07: 7 mL via INTRAVENOUS

## 2020-08-07 NOTE — Telephone Encounter (Signed)
Copied from [place] [id number]. Topic: Medicare AWV >> Aug 07, 2020  2:28 PM Cher Nakai R wrote: Reason for CRM:   No answer unable to leave a  message for patient to call back and schedule Medicare Annual Wellness Visit (AWV) in office.   If unable to come into the office for AWV,  please offer to do virtually or by telephone.  No hx of AWV eligible for AWVI as of  07/10/2020  Please schedule at anytime with [place].      40 Minutes appointment   Any questions, please call me at [phone]

## 2020-08-11 ENCOUNTER — Ambulatory Visit: Payer: Medicare HMO

## 2020-08-17 ENCOUNTER — Telehealth: Payer: Self-pay | Admitting: Family Medicine

## 2020-08-17 NOTE — Telephone Encounter (Signed)
Copied from [place] [id number]. Topic: Medicare AWV >> Aug 17, 2020 10:28 AM Cher Nakai R wrote: Reason for CRM:   Left message for patient to call back and schedule Medicare Annual Wellness Visit (AWV) in office.   If unable to come into the office for AWV,  please offer to do virtually or by telephone.  No hx of AWV eligible for AWVI as of  07/10/2020  Please schedule at anytime with [place].      40 Minutes appointment   Any questions, please call me at [phone]

## 2020-08-25 ENCOUNTER — Telehealth: Payer: Self-pay | Admitting: Urology

## 2020-08-25 NOTE — Telephone Encounter (Signed)
LMOM for pt to return call to schedule appt  follow up in the next 2 weeks to discuss prostate MRI results

## 2020-08-27 ENCOUNTER — Ambulatory Visit (INDEPENDENT_AMBULATORY_CARE_PROVIDER_SITE_OTHER): Payer: Medicare HMO

## 2020-08-27 ENCOUNTER — Telehealth: Payer: Self-pay | Admitting: *Deleted

## 2020-08-27 DIAGNOSIS — E785 Hyperlipidemia, unspecified: Secondary | ICD-10-CM

## 2020-08-27 DIAGNOSIS — Z0000 Encounter for general adult medical examination without abnormal findings: Secondary | ICD-10-CM

## 2020-08-27 DIAGNOSIS — I10 Essential (primary) hypertension: Secondary | ICD-10-CM

## 2020-08-27 DIAGNOSIS — Z599 Problem related to housing and economic circumstances, unspecified: Secondary | ICD-10-CM | POA: Diagnosis not present

## 2020-08-27 DIAGNOSIS — Z789 Other specified health status: Secondary | ICD-10-CM | POA: Diagnosis not present

## 2020-08-27 DIAGNOSIS — Z1211 Encounter for screening for malignant neoplasm of colon: Secondary | ICD-10-CM | POA: Diagnosis not present

## 2020-08-27 DIAGNOSIS — Z5941 Food insecurity: Secondary | ICD-10-CM | POA: Diagnosis not present

## 2020-08-27 NOTE — Chronic Care Management (AMB) (Signed)
  Chronic Care Management   Outreach Note  08/27/2020 Name: Xavier [name] MRN: [id number] DOB: March 28, 1950  Xavier [name] is a 70 y.o. year old male who is a primary care patient of Steele Sizer, MD. I reached out to Xavier [name] by phone today in response to a referral sent by Mr. Xavier [name]'s PCP Steele Sizer, MD     An unsuccessful telephone outreach was attempted today. The patient was referred to the case management team for assistance with care management and care coordination.   Follow Up Plan: A HIPAA compliant phone message was left for the patient providing contact information and requesting a return call.  If patient returns call to provider office, please advise to call Embedded Care Management Care Guide Deeandra Jerry at [place], [facility]  Direct Dial: [phone]

## 2020-08-27 NOTE — Progress Notes (Signed)
Subjective:   Xavier [name] is a 70 y.o. male who presents for an Initial Medicare Annual Wellness Visit.  Virtual Visit via Telephone Note  I connected with  Ardath Seele on 08/27/20 at  2:10 PM EDT by telephone and verified that I am speaking with the correct person using two identifiers.  Location: Patient: home Provider: [place] Persons participating in the virtual visit: [place]   I discussed the limitations, risks, security and privacy concerns of performing an evaluation and management service by telephone and the availability of in person appointments. The patient expressed understanding and agreed to proceed.  Interactive audio and video telecommunications were attempted between this nurse and patient, however failed, due to patient having technical difficulties OR patient did not have access to video capability.  We continued and completed visit with audio only.  Some vital signs may be absent or patient reported.   Clemetine Marker, LPN   Review of Systems     Cardiac Risk Factors include: advanced age (>14mn, >>48women);male gender;dyslipidemia;hypertension     Objective:    There were no vitals filed for this visit. There is no height or weight on file to calculate BMI.  Advanced Directives 08/27/2020 04/20/2019 04/17/2019 04/15/2019 08/07/2018 06/21/2016 03/18/2016  Does Patient Have a Medical Advance Directive? No No No No No Yes Yes  Would patient like information on creating a medical advance directive? Yes (MAU/Ambulatory/Procedural Areas - Information given) No - Patient declined No - Patient declined No - Patient declined No - Patient declined - -    Current Medications (verified) Outpatient Encounter Medications as of 08/27/2020  Medication Sig   aspirin EC 81 MG tablet Take 1 tablet (81 mg total) by mouth daily.   losartan (COZAAR) 100 MG tablet Take 1 tablet (100 mg total) by mouth daily. New bp medication   pravastatin (PRAVACHOL)  40 MG tablet Take 40 mg by mouth daily.   atorvastatin (LIPITOR) 40 MG tablet Take 1 tablet (40 mg total) by mouth daily. New cholesterol medication - in place of pravastatin (Patient not taking: Reported on 08/27/2020)   hydrochlorothiazide (HYDRODIURIL) 25 MG tablet Take 1 tablet by mouth daily. (Patient not taking: Reported on 08/27/2020)   [DISCONTINUED] ibuprofen (ADVIL) 600 MG tablet Take 1 tablet (600 mg total) by mouth every 8 (eight) hours as needed for mild pain or moderate pain.   No facility-administered encounter medications on file as of 08/27/2020.    Allergies (verified) Patient has no known allergies.   History: Past Medical History:  Diagnosis Date   Family hx of colon cancer requiring screening colonoscopy 06/21/2016   Father at age 355  History of CVA (cerebrovascular accident) 06/22/2011   Overview:  right sided stroke in 2013 (basal ganglia, right insular region, and white matter of the left frontal lobe    Hyperlipidemia    Hypertension    Stroke ([facility] 2013   Past Surgical History:  Procedure Laterality Date   CYSTOSCOPY WITH RETROGRADE URETHROGRAM N/A 04/17/2019   Procedure: CYSTOSCOPY WITH BALLOON DILATION OF URETHRAL STRICTURE;  Surgeon: SAbbie Sons MD;  Location: [facility] ORS;  Service: Urology;  Laterality: N/A;   CYSTOSCOPY WITH URETHRAL DILATATION N/A 04/24/2020   Procedure: CYSTOSCOPY WITH URETHRAL BALLOON DILATATION;  Surgeon: SBilley Co MD;  Location: [facility] ORS;  Service: Urology;  Laterality: N/A;   TONSILLECTOMY  1958   TONSILLECTOMY     UMBILICAL HERNIA REPAIR N/A 04/17/2019   Procedure: HERNIA REPAIR UMBILICAL ADULT;  Surgeon:  Olean Ree, MD;  Location: [facility] ORS;  Service: General;  Laterality: N/A;   URETHROTOMY N/A 04/17/2019   Procedure: DIRECT VISION INTERNAL URETHROTOMY;  Surgeon: Abbie Sons, MD;  Location: [facility] ORS;  Service: Urology;  Laterality: N/A;   Family History  Problem Relation Age of Onset   Arthritis Mother    Kidney  disease Mother    Heart disease Father    Cancer Father        lung, colon   Diabetes Brother    Cancer Brother        stomach tumor   Glaucoma Maternal Grandmother    Kidney disease Paternal Grandmother    Neuropathy Brother    Social History   Socioeconomic History   Marital status: Married    Spouse name: Not on file   Number of children: 1   Years of education: Not on file   Highest education level: High school graduate  Occupational History   Not on file  Tobacco Use   Smoking status: Never   Smokeless tobacco: Never  Vaping Use   Vaping Use: Never used  Substance and Sexual Activity   Alcohol use: Yes    Alcohol/week: 1.0 standard drink    Types: 1 Cans of beer per week    Comment: 1 beer daily   Drug use: No   Sexual activity: Yes  Other Topics Concern   Not on file  Social History Narrative   1 son - adopted   Social Determinants of Health   Financial Resource Strain: High Risk   Difficulty of Paying Living Expenses: Hard  Food Insecurity: Food Insecurity Present   Worried About Charity fundraiser in the Last Year: Sometimes true   Arboriculturist in the Last Year: Never true  Transportation Needs: No Transportation Needs   Lack of Transportation (Medical): No   Lack of Transportation (Non-Medical): No  Physical Activity: Sufficiently Active   Days of Exercise per Week: 5 days   Minutes of Exercise per Session: 30 min  Stress: No Stress Concern Present   Feeling of Stress : Only a little  Social Connections: Moderately Integrated   Frequency of Communication with Friends and Family: More than three times a week   Frequency of Social Gatherings with Friends and Family: Once a week   Attends Religious Services: More than 4 times per year   Active Member of [employer] or Organizations: No   Attends Music therapist: Never   Marital Status: Married    Tobacco Counseling Counseling given: Not Answered   Clinical Intake:  Pre-visit  preparation completed: Yes  Pain : No/denies pain     Nutritional Risks: None Diabetes: No  How often do you need to have someone help you when you read instructions, pamphlets, or other written materials from your doctor or pharmacy?: 1 - Never   Interpreter Needed?: No  Information entered by :: Clemetine Marker LPN   Activities of Daily Living In your present state of health, do you have any difficulty performing the following activities: 08/27/2020 04/17/2020  Hearing? Y Y  Comment needs hearing evaluation -  Vision? N N  Difficulty concentrating or making decisions? Tempie Donning  Walking or climbing stairs? N N  Dressing or bathing? N N  Doing errands, shopping? N N  Preparing Food and eating ? N -  Using the Toilet? N -  In the past six months, have you accidently leaked urine? Y -  Comment uses catheters,  wears depends -  Do you have problems with loss of bowel control? N -  Managing your Medications? N -  Managing your Finances? N -  Housekeeping or managing your Housekeeping? N -  Some recent data might be hidden    Patient Care Team: Steele Sizer, MD as PCP - General (Family Medicine)  Indicate any recent Medical Services you may have received from other than [facility] providers in the past year (date may be approximate).     Assessment:   This is a routine wellness examination for Minnie.  Hearing/Vision screen Hearing Screening - Comments:: Pt c/o mild hearing difficulty; needs hearing evaluation  Vision Screening - Comments:: Vision screenings done at [facility]; due for exam  Dietary issues and exercise activities discussed: Current Exercise Habits: Home exercise routine, Type of exercise: strength training/weights, Time (Minutes): 30, Frequency (Times/Week): 5, Weekly Exercise (Minutes/Week): 150, Intensity: Moderate, Exercise limited by: None identified   Goals Addressed   None    Depression Screen PHQ 2/9 Scores 08/27/2020 04/17/2020 03/03/2020 11/19/2019  05/10/2019 04/16/2019 03/29/2019  PHQ - 2 Score 1 1 0 0 1 0 1  PHQ- 9 Score 3 3 - - 4 0 2    Fall Risk Fall Risk  08/27/2020 04/17/2020 03/03/2020 11/19/2019 05/10/2019  Falls in the past year? 0 0 0 0 0  Number falls in past yr: 0 0 0 0 0  Injury with Fall? 0 0 0 0 0  Comment - - - - -  Risk for fall due to : No Fall Risks - - - -  Follow up Falls prevention discussed - Falls evaluation completed - -    FALL RISK PREVENTION PERTAINING TO THE HOME:  Any stairs in or around the home? No  If so, are there any without handrails? No  Home free of loose throw rugs in walkways, pet beds, electrical cords, etc? Yes  Adequate lighting in your home to reduce risk of falls? Yes   ASSISTIVE DEVICES UTILIZED TO PREVENT FALLS:  Life alert? No  Use of a cane, walker or w/c? No  Grab bars in the bathroom? No  Shower chair or bench in shower? No  Elevated toilet seat or a handicapped toilet? No   TIMED UP AND GO:  Was the test performed? No . Telephonic visit.   Cognitive Function:     6CIT Screen 08/27/2020  What Year? 0 points  What month? 0 points  What time? 0 points  Count back from 20 0 points  Months in reverse 0 points  Repeat phrase 6 points  Total Score 6    Immunizations Immunization History  Administered Date(s) Administered   Influenza, High Dose Seasonal PF 02/16/2017, 09/19/2017   Influenza,inj,Quad PF,6+ Mos 02/03/2016   Pneumococcal Conjugate-13 06/21/2016   Pneumococcal Polysaccharide-23 09/19/2017   Tdap 05/27/2011, 02/03/2016    TDAP status: Up to date  Flu Vaccine status: Due, Education has been provided regarding the importance of this vaccine. Advised may receive this vaccine at local pharmacy or Health Dept. Aware to provide a copy of the vaccination record if obtained from local pharmacy or Health Dept. Verbalized acceptance and understanding.  Pneumococcal vaccine status: Up to date  Covid-19 vaccine status: Information provided on how to obtain vaccines.    Qualifies for Shingles Vaccine? Yes   Zostavax completed No   Shingrix Completed?: No.    Education has been provided regarding the importance of this vaccine. Patient has been advised to call insurance company to determine  out of pocket expense if they have not yet received this vaccine. Advised may also receive vaccine at local pharmacy or Health Dept. Verbalized acceptance and understanding.  Screening Tests Health Maintenance  Topic Date Due   COVID-19 Vaccine (1) Never done   Zoster Vaccines- Shingrix (1 of 2) Never done   INFLUENZA VACCINE  08/10/2020   Fecal DNA (Cologuard)  10/05/2020   TETANUS/TDAP  02/02/2026   Hepatitis C Screening  Completed   PNA vac Low Risk Adult  Completed   HPV VACCINES  Aged Out    Health Maintenance  Health Maintenance Due  Topic Date Due   COVID-19 Vaccine (1) Never done   Zoster Vaccines- Shingrix (1 of 2) Never done   INFLUENZA VACCINE  08/10/2020    Colorectal cancer screening: Type of screening: Cologuard. Completed 10/05/17. Repeat every 3 years. Ordered today  Lung Cancer Screening: (Low Dose CT Chest recommended if Age 57-80 years, 30 pack-year currently smoking OR have quit w/in 15years.) does not qualify.   Additional Screening:  Hepatitis C Screening: does qualify; Completed 02/16/17  Vision Screening: Recommended annual ophthalmology exams for early detection of glaucoma and other disorders of the eye. Is the patient up to date with their annual eye exam?  No  Who is the provider or what is the name of the office in which the patient attends annual eye exams? [facility].   Dental Screening: Recommended annual dental exams for proper oral hygiene  Community Resource Referral / Chronic Care Management: CRR required this visit?  Yes   CCM required this visit?  Yes      Plan:     I have personally reviewed and noted the following in the patient's chart:   Medical and social history Use of alcohol, tobacco or  illicit drugs  Current medications and supplements including opioid prescriptions. Patient is not currently taking opioid prescriptions. Functional ability and status Nutritional status Physical activity Advanced directives List of other physicians Hospitalizations, surgeries, and ER visits in previous 12 months Vitals Screenings to include cognitive, depression, and falls Referrals and appointments  In addition, I have reviewed and discussed with patient certain preventive protocols, quality metrics, and best practice recommendations. A written personalized care plan for preventive services as well as general preventive health recommendations were provided to patient.     Clemetine Marker, LPN   [id number]   Nurse Notes: see referral notes for CRR and CCM referral for pharmacy regarding need for patient assistance and medication management.

## 2020-08-27 NOTE — Patient Instructions (Signed)
Xavier [name] , Thank you for taking time to come for your Medicare Wellness Visit. I appreciate your ongoing commitment to your health goals. Please review the following plan we discussed and let me know if I can assist you in the future.   Screening recommendations/referrals: Colonoscopy: done 10/05/17. Reordered today Recommended yearly ophthalmology/optometry visit for glaucoma screening and checkup Recommended yearly dental visit for hygiene and checkup  Vaccinations: Influenza vaccine: due 09/10/20 Pneumococcal vaccine: done 09/19/17 Tdap vaccine: done 02/03/16 Shingles vaccine: Shingrix discussed. Please contact your pharmacy for coverage information.  Covid-19: Information provided on how to receive Covid vaccines  Advanced directives: Advance directive discussed with you today. I have provided a copy for you to complete at home and have notarized. Once this is complete please bring a copy in to our office so we can scan it into your chart.   Conditions/risks identified: Recommend follow up with Chronic Care Management for medication management  Next appointment: Follow up in one year for your annual wellness visit.   Preventive Care 69 Years and Older, Male Preventive care refers to lifestyle choices and visits with your health care provider that can promote health and wellness. What does preventive care include? A yearly physical exam. This is also called an annual well check. Dental exams once or twice a year. Routine eye exams. Ask your health care provider how often you should have your eyes checked. Personal lifestyle choices, including: Daily care of your teeth and gums. Regular physical activity. Eating a healthy diet. Avoiding tobacco and drug use. Limiting alcohol use. Practicing safe sex. Taking low doses of aspirin every day. Taking vitamin and mineral supplements as recommended by your health care provider. What happens during an annual well check? The services and  screenings done by your health care provider during your annual well check will depend on your age, overall health, lifestyle risk factors, and family history of disease. Counseling  Your health care provider may ask you questions about your: Alcohol use. Tobacco use. Drug use. Emotional well-being. Home and relationship well-being. Sexual activity. Eating habits. History of falls. Memory and ability to understand (cognition). Work and work Statistician. Screening  You may have the following tests or measurements: Height, weight, and BMI. Blood pressure. Lipid and cholesterol levels. These may be checked every 5 years, or more frequently if you are over 52 years old. Skin check. Lung cancer screening. You may have this screening every year starting at age 26 if you have a 30-pack-year history of smoking and currently smoke or have quit within the past 15 years. Fecal occult blood test (FOBT) of the stool. You may have this test every year starting at age 86. Flexible sigmoidoscopy or colonoscopy. You may have a sigmoidoscopy every 5 years or a colonoscopy every 10 years starting at age 33. Prostate cancer screening. Recommendations will vary depending on your family history and other risks. Hepatitis C blood test. Hepatitis B blood test. Sexually transmitted disease (STD) testing. Diabetes screening. This is done by checking your blood sugar (glucose) after you have not eaten for a while (fasting). You may have this done every 1-3 years. Abdominal aortic aneurysm (AAA) screening. You may need this if you are a current or former smoker. Osteoporosis. You may be screened starting at age 1 if you are at high risk. Talk with your health care provider about your test results, treatment options, and if necessary, the need for more tests. Vaccines  Your health care provider may recommend certain vaccines, such  as: Influenza vaccine. This is recommended every year. Tetanus, diphtheria, and  acellular pertussis (Tdap, Td) vaccine. You may need a Td booster every 10 years. Zoster vaccine. You may need this after age 26. Pneumococcal 13-valent conjugate (PCV13) vaccine. One dose is recommended after age 11. Pneumococcal polysaccharide (PPSV23) vaccine. One dose is recommended after age 46. Talk to your health care provider about which screenings and vaccines you need and how often you need them. This information is not intended to replace advice given to you by your health care provider. Make sure you discuss any questions you have with your health care provider. Document Released: 01/23/2015 Document Revised: 09/16/2015 Document Reviewed: 10/28/2014 Elsevier Interactive Patient Education  2017 [employer] Prevention in the Home Falls can cause injuries. They can happen to people of all ages. There are many things you can do to make your home safe and to help prevent falls. What can I do on the outside of my home? Regularly fix the edges of walkways and driveways and fix any cracks. Remove anything that might make you trip as you walk through a door, such as a raised step or threshold. Trim any bushes or trees on the path to your home. Use bright outdoor lighting. Clear any walking paths of anything that might make someone trip, such as rocks or tools. Regularly check to see if handrails are loose or broken. Make sure that both sides of any steps have handrails. Any raised decks and porches should have guardrails on the edges. Have any leaves, snow, or ice cleared regularly. Use sand or salt on walking paths during winter. Clean up any spills in your garage right away. This includes oil or grease spills. What can I do in the bathroom? Use night lights. Install grab bars by the toilet and in the tub and shower. Do not use towel bars as grab bars. Use non-skid mats or decals in the tub or shower. If you need to sit down in the shower, use a plastic, non-slip stool. Keep  the floor dry. Clean up any water that spills on the floor as soon as it happens. Remove soap buildup in the tub or shower regularly. Attach bath mats securely with double-sided non-slip rug tape. Do not have throw rugs and other things on the floor that can make you trip. What can I do in the bedroom? Use night lights. Make sure that you have a light by your bed that is easy to reach. Do not use any sheets or blankets that are too big for your bed. They should not hang down onto the floor. Have a firm chair that has side arms. You can use this for support while you get dressed. Do not have throw rugs and other things on the floor that can make you trip. What can I do in the kitchen? Clean up any spills right away. Avoid walking on wet floors. Keep items that you use a lot in easy-to-reach places. If you need to reach something above you, use a strong step stool that has a grab bar. Keep electrical cords out of the way. Do not use floor polish or wax that makes floors slippery. If you must use wax, use non-skid floor wax. Do not have throw rugs and other things on the floor that can make you trip. What can I do with my stairs? Do not leave any items on the stairs. Make sure that there are handrails on both sides of the stairs and use  them. Fix handrails that are broken or loose. Make sure that handrails are as long as the stairways. Check any carpeting to make sure that it is firmly attached to the stairs. Fix any carpet that is loose or worn. Avoid having throw rugs at the top or bottom of the stairs. If you do have throw rugs, attach them to the floor with carpet tape. Make sure that you have a light switch at the top of the stairs and the bottom of the stairs. If you do not have them, ask someone to add them for you. What else can I do to help prevent falls? Wear shoes that: Do not have high heels. Have rubber bottoms. Are comfortable and fit you well. Are closed at the toe. Do not  wear sandals. If you use a stepladder: Make sure that it is fully opened. Do not climb a closed stepladder. Make sure that both sides of the stepladder are locked into place. Ask someone to hold it for you, if possible. Clearly mark and make sure that you can see: Any grab bars or handrails. First and last steps. Where the edge of each step is. Use tools that help you move around (mobility aids) if they are needed. These include: Canes. Walkers. Scooters. Crutches. Turn on the lights when you go into a dark area. Replace any light bulbs as soon as they burn out. Set up your furniture so you have a clear path. Avoid moving your furniture around. If any of your floors are uneven, fix them. If there are any pets around you, be aware of where they are. Review your medicines with your doctor. Some medicines can make you feel dizzy. This can increase your chance of falling. Ask your doctor what other things that you can do to help prevent falls. This information is not intended to replace advice given to you by your health care provider. Make sure you discuss any questions you have with your health care provider. Document Released: 10/23/2008 Document Revised: 06/04/2015 Document Reviewed: 01/31/2014 Elsevier Interactive Patient Education  2017 [employer].

## 2020-08-28 NOTE — Chronic Care Management (AMB) (Signed)
  Chronic Care Management   Outreach Note  08/28/2020 Name: Xavier [name] MRN: [id number] DOB: November 13, 1950  Xavier [name] is a 70 y.o. year old male who is a primary care patient of Steele Sizer, MD. I reached out to Xavier [name] by phone today in response to a referral sent by Xavier [name]'s PCP Steele Sizer, MD     A second unsuccessful telephone outreach was attempted today. The patient was referred to the case management team for assistance with care management and care coordination.   Follow Up Plan: If patient returns call to provider office, please advise to call Embedded Care Management Care Guide Xavier [name] at [place], [place] [facility]  Direct Dial: [phone]

## 2020-09-02 NOTE — Chronic Care Management (AMB) (Signed)
  Chronic Care Management   Outreach Note  09/02/2020 Name: Xavier [name] MRN: [id number] DOB: 10-May-1950  Xavier [name] is a 70 y.o. year old male who is a primary care patient of Xavier Sizer, MD. I reached out to Xavier [name] by phone today in response to a referral sent by Xavier [name]'s PCP Xavier Sizer, MD     Third unsuccessful telephone outreach was attempted today. The patient was referred to the case management team for assistance with care management and care coordination. The patient's primary care provider has been notified of our unsuccessful attempts to make or maintain contact with the patient. The care management team is pleased to engage with this patient at any time in the future should he/she be interested in assistance from the care management team.   Follow Up Plan: We have been unable to make contact with the patient for follow up. The care management team is available to follow up with the patient after provider conversation with the patient regarding recommendation for care management engagement and subsequent re-referral to the care management team.   Julian Hy, [place] [facility]  Direct Dial: [phone]

## 2020-09-10 NOTE — Addendum Note (Signed)
Encounter addended by: Annie Paras on: 09/10/2020 10:03 AM  Actions taken: Letter saved

## 2020-10-06 DIAGNOSIS — R339 Retention of urine, unspecified: Secondary | ICD-10-CM | POA: Diagnosis not present

## 2020-10-09 ENCOUNTER — Telehealth: Payer: Self-pay

## 2020-10-09 NOTE — Telephone Encounter (Signed)
-----   Message from Steele Sizer, MD sent at 10/08/2020  2:15 PM EDT ----- Regarding: FW: Prostate MRI Can you try to get a hold of him and ask him to contact Urologist?  ----- Message ----- From: Billey Co, MD Sent: 10/08/2020   8:30 AM EDT To: Steele Sizer, MD Subject: Prostate MRI                                   This patient's prostate MRI is highly suspicious for prostate cancer with metastatic disease to lymph nodes as well as bone lesion in femur.  My office has been unable to get a hold of him or leave a message for the last month, he needs referral to oncology to consider systemic treatment options. He has a follow up with me early October and hopefully he will come in and we can get things moving in terms of a treatment plan. Thanks  Nickolas Madrid, MD 10/08/2020

## 2020-10-09 NOTE — Telephone Encounter (Signed)
Attempted to contact patient and wife. Neither phones have voicemail and neither answered.

## 2020-10-15 ENCOUNTER — Other Ambulatory Visit: Payer: Self-pay

## 2020-10-16 ENCOUNTER — Encounter: Payer: Self-pay | Admitting: Urology

## 2020-10-16 ENCOUNTER — Ambulatory Visit: Payer: Medicare HMO | Admitting: Family Medicine

## 2020-10-20 ENCOUNTER — Other Ambulatory Visit: Payer: Self-pay

## 2020-10-20 ENCOUNTER — Encounter: Payer: Self-pay | Admitting: Family Medicine

## 2020-10-20 ENCOUNTER — Ambulatory Visit (INDEPENDENT_AMBULATORY_CARE_PROVIDER_SITE_OTHER): Payer: Medicare HMO | Admitting: Family Medicine

## 2020-10-20 VITALS — BP 140/88 | HR 76 | Temp 98.3°F | Resp 16 | Ht 64.0 in | Wt 155.0 lb

## 2020-10-20 DIAGNOSIS — R935 Abnormal findings on diagnostic imaging of other abdominal regions, including retroperitoneum: Secondary | ICD-10-CM

## 2020-10-20 DIAGNOSIS — R7303 Prediabetes: Secondary | ICD-10-CM

## 2020-10-20 DIAGNOSIS — I10 Essential (primary) hypertension: Secondary | ICD-10-CM | POA: Diagnosis not present

## 2020-10-20 DIAGNOSIS — R972 Elevated prostate specific antigen [PSA]: Secondary | ICD-10-CM

## 2020-10-20 DIAGNOSIS — Z23 Encounter for immunization: Secondary | ICD-10-CM

## 2020-10-20 DIAGNOSIS — I69354 Hemiplegia and hemiparesis following cerebral infarction affecting left non-dominant side: Secondary | ICD-10-CM | POA: Diagnosis not present

## 2020-10-20 DIAGNOSIS — R399 Unspecified symptoms and signs involving the genitourinary system: Secondary | ICD-10-CM | POA: Diagnosis not present

## 2020-10-20 DIAGNOSIS — I739 Peripheral vascular disease, unspecified: Secondary | ICD-10-CM

## 2020-10-20 DIAGNOSIS — R69 Illness, unspecified: Secondary | ICD-10-CM | POA: Diagnosis not present

## 2020-10-20 DIAGNOSIS — F5101 Primary insomnia: Secondary | ICD-10-CM

## 2020-10-20 MED ORDER — TRAZODONE HCL 50 MG PO TABS: 25.0000 mg | ORAL_TABLET | Freq: Every evening | ORAL | 0 refills | Status: DC | PRN

## 2020-10-20 NOTE — Progress Notes (Signed)
Name: Xavier [name]   MRN: [id number]    DOB: 02-Feb-1950   Date:10/20/2020       Progress Note  Subjective  Chief Complaint  Follow Up  HPI  History of CVA with left side hemiparesis: it happened in 2013, he still has some left side weakness. He was taking Pravastatin and we changed to Atorvastatin on his last visit no side effects. He is also taking 81 mg aspirin   Anemia of chronic diease: iron studies normal 03/2019 and Hbg improved on last labs   HTN: he has a rx of losartan hctz 100/25 mg he is getting his medications delivered to his house, and has been compliant, no chest pain or palpitation   LUTS elevated PSA/abnormal MRI pelvis : history of urinary retention, under the care of Urologist, Dr. Diamantina Providence and last PSA done 06/2020 was 17.4. His father had prostate cancer, and reviewed MRI of prostate and discussed with patient, reached out to Dr. Diamantina Providence since they were not able to get a hold of him with results. Placing referral to oncologist for further evaluation   Peripheral vascular disease: he can walk about one mile before having to stop because left lower leg aches, it improves with rest, he is on statin therapy but last LDL not at goal, needs to be below 70. Taking Atorvastatin now   Dysthymia: he states lives out of town, 21 miles and has difficulty with transportation, he is now diagnosed with possible prostate cancer. Spoke to his wife over the phone also. He states he will follow through, seems okay with diagnosis.   Patient Active Problem List   Diagnosis Date Noted   Mild anemia 05/10/2019   Hemiparesis affecting left side as late effect of cerebrovascular accident ([place]) 04/16/2019   Prediabetes 03/29/2019   Increased prostate specific antigen (PSA) velocity 03/29/2019   Lower urinary tract symptoms (LUTS) 02/22/2019   Impingement syndrome of shoulder, left 09/28/2017   Hearing loss 28/41/3244   Umbilical hernia without obstruction and without gangrene  06/21/2016   Medication monitoring encounter 02/23/2016   Hyperglycemia 02/04/2016   Left carotid bruit 02/03/2016   Arthritis of both hands 03/10/2015   Spider varicose veins 03/10/2015   Low serum thyroid stimulating hormone (TSH) 07/22/2014   Hyperlipidemia LDL goal <70 07/21/2014   Hypertension goal BP (blood pressure) < 140/90 07/21/2014   Peripheral arterial disease ([place]) 07/21/2014    Past Surgical History:  Procedure Laterality Date   CYSTOSCOPY WITH RETROGRADE URETHROGRAM N/A 04/17/2019   Procedure: CYSTOSCOPY WITH BALLOON DILATION OF URETHRAL STRICTURE;  Surgeon: Abbie Sons, MD;  Location: [facility];  Service: Urology;  Laterality: N/A;   CYSTOSCOPY WITH URETHRAL DILATATION N/A 04/24/2020   Procedure: CYSTOSCOPY WITH URETHRAL BALLOON DILATATION;  Surgeon: Billey Co, MD;  Location: [facility];  Service: Urology;  Laterality: N/A;   TONSILLECTOMY  1958   TONSILLECTOMY     UMBILICAL HERNIA REPAIR N/A 04/17/2019   Procedure: HERNIA REPAIR UMBILICAL ADULT;  Surgeon: Olean Ree, MD;  Location: [facility];  Service: General;  Laterality: N/A;   URETHROTOMY N/A 04/17/2019   Procedure: DIRECT VISION INTERNAL URETHROTOMY;  Surgeon: Abbie Sons, MD;  Location: [facility];  Service: Urology;  Laterality: N/A;    Family History  Problem Relation Age of Onset   Arthritis Mother    Kidney disease Mother    Heart disease Father    Cancer Father        lung, colon   Diabetes Brother  Cancer Brother        stomach tumor   Glaucoma Maternal Grandmother    Kidney disease Paternal Grandmother    Neuropathy Brother     Social History   Tobacco Use   Smoking status: Never   Smokeless tobacco: Never  Substance Use Topics   Alcohol use: Yes    Alcohol/week: 1.0 standard drink    Types: 1 Cans of beer per week    Comment: 1 beer daily     Current Outpatient Medications:    aspirin EC 81 MG tablet, Take 1 tablet (81 mg total) by mouth daily., Disp: , Rfl:     atorvastatin (LIPITOR) 40 MG tablet, Take 1 tablet (40 mg total) by mouth daily. New cholesterol medication - in place of pravastatin, Disp: 90 tablet, Rfl: 1   hydrochlorothiazide (HYDRODIURIL) 25 MG tablet, Take 1 tablet by mouth daily., Disp: , Rfl:    losartan (COZAAR) 100 MG tablet, Take 1 tablet (100 mg total) by mouth daily. New bp medication, Disp: 90 tablet, Rfl: 1   pravastatin (PRAVACHOL) 40 MG tablet, Take 40 mg by mouth daily., Disp: , Rfl:   No Known Allergies  I personally reviewed active problem list, medication list, allergies, family history, social history, health maintenance with the patient/caregiver today.   ROS  Constitutional: Negative for fever or weight change.  Respiratory: Negative for cough and shortness of breath.   Cardiovascular: Negative for chest pain or palpitations.  Gastrointestinal: Negative for abdominal pain, no bowel changes.  Musculoskeletal: Negative for gait problem or joint swelling.  Skin: Negative for rash.  Neurological: Negative for dizziness or headache.  No other specific complaints in a complete review of systems (except as listed in HPI above).   Objective  Vitals:   10/20/20 1007  BP: 140/88  Pulse: 76  Resp: 16  Temp: 98.3 F (36.8 C)  SpO2: 99%  Weight: 155 lb (70.3 kg)  Height: 5\' 4"  (1.626 m)    Body mass index is 26.61 kg/m.  Physical Exam  Constitutional: Patient appears well-developed and well-nourished.  No distress.  HEENT: head atraumatic, normocephalic, pupils equal and reactive to light, neck supple Cardiovascular: Normal rate, regular rhythm and normal heart sounds.  No murmur heard. No BLE edema. Pulmonary/Chest: Effort normal and breath sounds normal. No respiratory distress. Abdominal: Soft.  There is no tenderness. Psychiatric: Patient has a normal mood and affect. behavior is normal. Judgment and thought content normal.   PHQ2/9: Depression screen [facility] 2/9 10/20/2020 08/27/2020 04/17/2020 03/03/2020  11/19/2019  Decreased Interest 0 0 0 0 0  Down, Depressed, Hopeless 0 1 1 0 0  PHQ - 2 Score 0 1 1 0 0  Altered sleeping - 1 1 - -  Tired, decreased energy - 1 1 - -  Change in appetite - 0 0 - -  Feeling bad or failure about yourself  - 0 0 - -  Trouble concentrating - 0 0 - -  Moving slowly or fidgety/restless - 0 0 - -  Suicidal thoughts - 0 0 - -  PHQ-9 Score - 3 3 - -  Difficult doing work/chores - Not difficult at all - - -  Some recent data might be hidden    phq 9 is negative   Fall Risk: Fall Risk  10/20/2020 08/27/2020 04/17/2020 03/03/2020 11/19/2019  Falls in the past year? 0 0 0 0 0  Number falls in past yr: 0 0 0 0 0  Injury with Fall? 0 0 0  0 0  Comment - - - - -  Risk for fall due to : No Fall Risks No Fall Risks - - -  Follow up Falls prevention discussed Falls prevention discussed - Falls evaluation completed -      Functional Status Survey: Is the patient deaf or have difficulty hearing?: Yes Does the patient have difficulty seeing, even when wearing glasses/contacts?: No Does the patient have difficulty concentrating, remembering, or making decisions?: Yes Does the patient have difficulty walking or climbing stairs?: No Does the patient have difficulty dressing or bathing?: No Does the patient have difficulty doing errands alone such as visiting a doctor's office or shopping?: No    Assessment & Plan  1. Hemiparesis affecting left side as late effect of cerebrovascular accident [facility])  Continue medications  2. Essential hypertension, benign   3. Prediabetes   4. Need for immunization against influenza  - Flu Vaccine QUAD High Dose(Fluad)  5. Peripheral arterial disease ([place])   6. Lower urinary tract symptoms (LUTS)  - Ambulatory referral to Hematology / Oncology  7. Elevated PSA  - Ambulatory referral to Hematology / Oncology  8. Abnormal MRI, pelvis  - Ambulatory referral to Hematology / Oncology    9. Primary insomnia  -  traZODone (DESYREL) 50 MG tablet; Take 0.5-1 tablets (25-50 mg total) by mouth at bedtime as needed for sleep.  Dispense: 90 tablet; Refill: 0

## 2020-10-22 ENCOUNTER — Encounter: Payer: Self-pay | Admitting: Urology

## 2020-10-22 ENCOUNTER — Other Ambulatory Visit: Payer: Self-pay

## 2020-10-22 ENCOUNTER — Ambulatory Visit (INDEPENDENT_AMBULATORY_CARE_PROVIDER_SITE_OTHER): Payer: Medicare HMO | Admitting: Urology

## 2020-10-22 VITALS — BP 171/105 | HR 71 | Ht 63.0 in | Wt 155.0 lb

## 2020-10-22 DIAGNOSIS — C61 Malignant neoplasm of prostate: Secondary | ICD-10-CM | POA: Diagnosis not present

## 2020-10-22 DIAGNOSIS — N312 Flaccid neuropathic bladder, not elsewhere classified: Secondary | ICD-10-CM

## 2020-10-22 DIAGNOSIS — R972 Elevated prostate specific antigen [PSA]: Secondary | ICD-10-CM

## 2020-10-22 NOTE — Patient Instructions (Signed)
Prostate Cancer The prostate is a small gland that produces fluid that makes up semen (seminal fluid). It is located below the bladder in men, in front of the rectum. Prostate cancer is the abnormal growth of cells in the prostate gland. What are the causes? The exact cause of this condition is not known. What increases the risk? You are more likely to develop this condition if: You are 69 years of age or older. You have a family history of prostate cancer. You have a family history of breast and ovarian cancer. You have genes that are passed from parent to child (inherited), such as BRCA1 and BRCA2. You have Lynch syndrome. African American men and men of African descent are diagnosed with prostate cancer at higher rates than other men. The reasons for this are not well understood and are likely due to a combination of genetic and environmental factors. What are the signs or symptoms? Symptoms of this condition include: Problems with urination. This may include: A weak or interrupted flow of urine. Trouble starting or stopping urination. Trouble emptying the bladder all the way. The need to urinate more often, especially at night. Blood in urine or semen. Persistent pain or discomfort in the lower back, lower abdomen, or hips. Trouble getting an erection. Weakness or numbness in the legs or feet. How is this diagnosed? This condition can be diagnosed with: A digital rectal exam. For this exam, a health care provider inserts a gloved finger into the rectum to feel the prostate gland. A blood test called a prostate-specific antigen (PSA) test. A procedure in which a sample of tissue is taken from the prostate and checked under a microscope (prostate biopsy). An imaging test called transrectal ultrasonography. Once the condition is diagnosed, tests will be done to determine how far the cancer has spread. This is called staging the cancer. Staging may involve imaging tests, such as a bone  scan, CT scan, PET scan, or MRI. Stages of prostate cancer The stages of prostate cancer are as follows: Stage 1 (I). At this stage, the cancer is found in the prostate only. The cancer is not visible on imaging tests, and it is usually found by accident, such as during prostate surgery. Stage 2 (II). At this stage, the cancer is more advanced than it is in stage 1, but the cancer has not spread outside the prostate. Stage 3 (III). At this stage, the cancer has spread beyond the outer layer of the prostate to nearby tissues. The cancer may be found in the seminal vesicles, which are near the bladder and the prostate. Stage 4 (IV). At this stage, the cancer has spread to other parts of the body, such as the lymph nodes, bones, bladder, rectum, liver, or lungs. Prostate cancer grading Prostate cancer is also graded according to how the cancer cells look under a microscope. This is called the Gleason score and the total score can range from 6-10, indicating how likely it is that the cancer will spread (metastasize) to other parts of the body. The higher the score, the greater the likelihood that the cancer will spread. Gleason 6 or lower: This indicates that the cancer cells look similar to normal prostate cells (well differentiated). Gleason 7: This indicates that the cancer cells look somewhat similar to normal prostate cells (moderately differentiated). Gleason 8, 9, or 10: This indicates that the cancer cells look very different than normal prostate cells (poorly differentiated). How is this treated? Treatment for this condition depends on several factors,  including the stage of the cancer, your age, personal preferences, and your overall health. Talk with your health care provider about treatment options that are recommended for you. Common treatments include: Observation for early stage prostate cancer (active surveillance). This involves having exams, blood tests, and in some cases, more biopsies.  For some men, this is the only treatment needed. Surgery. Types of surgeries include: Open surgery (radical prostatectomy). In this surgery, a larger incision is made to remove the prostate. A laparoscopic radical prostatectomy. This is a surgery to remove the prostate and lymph nodes through several small incisions. It is often referred to as a minimally invasive surgery. A robotic radical prostatectomy. This is laparoscopic surgery to remove the prostate and lymph nodes with the help of robotic arms that are controlled by the surgeon. Cryoablation. This is surgery to freeze and destroy cancer cells. Radiation treatment. Types of radiation treatment include: External beam radiation. This type aims beams of radiation from outside the body at the prostate to destroy cancerous cells. Brachytherapy. This type uses radioactive needles, seeds, wires, or tubes that are implanted into the prostate gland. Like external beam radiation, brachytherapy destroys cancerous cells. An advantage is that this type of radiation limits the damage to surrounding tissue and has fewer side effects. Chemotherapy. This treatment kills cancer cells or stops them from multiplying. It kills both cancer cells and normal cells. Targeted therapy. This treatment uses medicines to kill cancer cells without damaging normal cells. Hormone treatment. This treatment involves taking medicines that act on testosterone, one of the male hormones, by: Stopping your body from producing testosterone. Blocking testosterone from reaching cancer cells. Follow these instructions at home: Lifestyle Do not use any products that contain nicotine or tobacco. These products include cigarettes, chewing tobacco, and vaping devices, such as e-cigarettes. If you need help quitting, ask your health care provider. Eat a healthy diet. To do this: Eat foods that are high in fiber. These include beans, whole grains, and fresh fruits and vegetables. Limit  foods that are high in fat and sugar. These include fried or sweet foods. Treatment for prostate cancer may affect sexual function. If you have a partner, continue to have intimate moments. This may include touching, holding, hugging, and caressing your partner. Get plenty of sleep. Consider joining a support group for men who have prostate cancer. Meeting with a support group may help you learn to manage the stress of having cancer. General instructions Take over-the-counter and prescription medicines only as told by your health care provider. If you have to go to the hospital, notify your cancer specialist (oncologist). Keep all follow-up visits. This is important. Where to find more information American Cancer Society: www.cancer.[place] of Clinical Oncology: www.cancer.net [employer]: www.cancer.gov Contact a health care provider if: You have new or increasing trouble urinating. You have new or increasing blood in your urine. You have new or increasing pain in your hips, back, or chest. Get help right away if: You have weakness or numbness in your legs. You cannot control urination or your bowel movements (incontinence). You have chills or a fever. Summary The prostate is a small gland that is involved in the production of semen. It is located below a man's bladder, in front of the rectum. Prostate cancer is the abnormal growth of cells in the prostate gland. Treatment for this condition depends on the stage of the cancer, your age, personal preferences, and your overall health. Talk with your health care provider about treatment  options that are recommended for you. Consider joining a support group for men who have prostate cancer. Meeting with a support group may help you learn to manage the stress of having cancer. This information is not intended to replace advice given to you by your health care provider. Make sure you discuss any questions you have with  your health care provider. Document Revised: 03/25/2020 Document Reviewed: 03/25/2020 Elsevier Patient Education  2022 Elsevier Inc.  

## 2020-10-22 NOTE — Progress Notes (Signed)
   10/22/2020 10:12 AM   [name] July 02, 1950 [id number]  Reason for visit: Follow up urethral stricture disease, atonic bladder, elevated PSA, suspected metastatic prostate cancer  HPI: 70 year old male with a number of urologic issues.  He is very challenging to get a hold of, and does not always keep follow-up appointments which makes his care challenging.  Briefly, long history of significantly elevated PVRs and incomplete emptying previously with overflow incontinence.  He underwent urethral stricture dilation in April 2021 with Dr. Bernardo Heater and was lost to follow-up, then was found to have recurrence of his urethral stricture and underwent OptiLube balloon dilation in April 2022.  He was still unable to empty with PVRs greater than 800 mL and overflow incontinence, and was started on CIC 3 times per day at the end of April 2022.  He has been doing very well on this, and is not having any more overflow incontinence.  He denies any problems passing the catheter, and he really does not urinate in between catheterizations.  He also has a history of rising PSA and a family history of prostate cancer, which were all in the setting of UTIs and retention.  At our last visit the PSA continued to rise to 17.4, and I ordered a prostate MRI.  I personally viewed and interpreted the prostate MRI dated 08/07/2020 that shows a 17 g prostate with 2 cm peripheral zone nodule in the right posterior lateral gland highly suspicious for prostate cancer(PI-RADS 5), as well as subtle lymphadenopathy suspicious for metastatic disease, as well as a 3.4 cm bone lesion in the proximal right femur suspicious for bone metastasis.  We had a long conversation about his MRI findings and most likely diagnosis of metastatic prostate cancer.  We discussed the need for biopsy for tissue diagnosis to determine treatment strategies.  We discussed that advanced prostate cancer is a evolving field and there are multiple  treatment options ranging from chemotherapy, radiation, hormones, and additional antiandrogen treatments.  Surgery is typically not a first-line treatment in patients with metastatic disease.  I added him to our tumor board conference for next week to discuss his complicated case further, referral has already been placed oncology, and anticipate biopsy of his larger 3.4 cm bone lesion to confirm pathology and metastatic disease with 1 procedure.  We also discussed the need for likely further staging imaging, and will defer to oncology and our conversation in tumor board.  Continue intermittent catheterization for atonic bladder 3 times daily Referral placed to oncology for suspected metastatic prostate cancer Added to tumor board to discuss location of biopsy and treatment strategies  Billey Co, MD  [facility] [address], [place], [place] [address] [phone]

## 2020-10-28 ENCOUNTER — Other Ambulatory Visit: Payer: Self-pay

## 2020-10-28 ENCOUNTER — Inpatient Hospital Stay: Payer: Medicare HMO

## 2020-10-28 ENCOUNTER — Inpatient Hospital Stay: Payer: Medicare HMO | Attending: Oncology | Admitting: Oncology

## 2020-10-28 ENCOUNTER — Encounter: Payer: Self-pay | Admitting: Oncology

## 2020-10-28 VITALS — BP 157/100 | HR 95 | Temp 98.0°F | Resp 16 | Wt 156.1 lb

## 2020-10-28 DIAGNOSIS — M899 Disorder of bone, unspecified: Secondary | ICD-10-CM | POA: Diagnosis not present

## 2020-10-28 DIAGNOSIS — R9349 Abnormal radiologic findings on diagnostic imaging of other urinary organs: Secondary | ICD-10-CM | POA: Insufficient documentation

## 2020-10-28 DIAGNOSIS — R972 Elevated prostate specific antigen [PSA]: Secondary | ICD-10-CM | POA: Diagnosis not present

## 2020-10-28 LAB — COMPREHENSIVE METABOLIC PANEL
ALT: 17 U/L (ref 0–44)
AST: 20 U/L (ref 15–41)
Albumin: 4.2 g/dL (ref 3.5–5.0)
Alkaline Phosphatase: 85 U/L (ref 38–126)
Anion gap: 6 (ref 5–15)
BUN: 12 mg/dL (ref 8–23)
CO2: 27 mmol/L (ref 22–32)
Calcium: 8.8 mg/dL — ABNORMAL LOW (ref 8.9–10.3)
Chloride: 100 mmol/L (ref 98–111)
Creatinine, Ser: 1.11 mg/dL (ref 0.61–1.24)
GFR, Estimated: >60 mL/min (ref >60)
Glucose, Bld: 110 mg/dL — ABNORMAL HIGH (ref 70–99)
Potassium: 4.4 mmol/L (ref 3.5–5.1)
Sodium: 133 mmol/L — ABNORMAL LOW (ref 135–145)
Total Bilirubin: 0.9 mg/dL (ref 0.3–1.2)
Total Protein: 7.9 g/dL (ref 6.5–8.1)

## 2020-10-28 LAB — CBC WITH DIFFERENTIAL/PLATELET
Abs Immature Granulocytes: 0.02 10*3/uL (ref 0.00–0.07)
Basophils Absolute: 0 10*3/uL (ref 0.0–0.1)
Basophils Relative: 0 %
Eosinophils Absolute: 0 10*3/uL (ref 0.0–0.5)
Eosinophils Relative: 1 %
HCT: 38.6 % — ABNORMAL LOW (ref 39.0–52.0)
Hemoglobin: 13 g/dL (ref 13.0–17.0)
Immature Granulocytes: 0 %
Lymphocytes Relative: 20 %
Lymphs Abs: 1.5 10*3/uL (ref 0.7–4.0)
MCH: 31.6 pg (ref 26.0–34.0)
MCHC: 33.7 g/dL (ref 30.0–36.0)
MCV: 93.7 fL (ref 80.0–100.0)
Monocytes Absolute: 0.7 10*3/uL (ref 0.1–1.0)
Monocytes Relative: 9 %
Neutro Abs: 5.2 10*3/uL (ref 1.7–7.7)
Neutrophils Relative %: 70 %
Platelets: 320 10*3/uL (ref 150–400)
RBC: 4.12 MIL/uL — ABNORMAL LOW (ref 4.22–5.81)
RDW: 11.9 % (ref 11.5–15.5)
WBC: 7.5 10*3/uL (ref 4.0–10.5)
nRBC: 0 % (ref 0.0–0.2)

## 2020-10-28 LAB — PSA: Prostatic Specific Antigen: 29.47 ng/mL — ABNORMAL HIGH (ref 0.00–4.00)

## 2020-10-28 NOTE — Progress Notes (Signed)
Hematology/Oncology Consult note [facility] [phone] [phone]  Patient Care Team: Steele Sizer, MD as PCP - General (Family Medicine)   Name of the patient: Xavier [name]  [id number]  1950/09/30    Reason for referral-concern for metastatic prostate cancer   Referring physician-Dr. Ancil Boozer  Date of visit: 10/28/20   History of presenting illness-patient is a 70 year old male with history of urethral Stricture disease and atonic bladder for which patient undergoes utilization 3 times a day.  He has been following up with urology in the past as well.  His PSA when checked in July 2022 was elevated at 17.4.  Prior to that his PSA was 4, 6 years ago.  Patient is here with his wife today.  States that he is doing well overall and is independent of his ADLs and IADLs.  He tries to workout every day.  Denies any changes in his appetite or weight.  Denies any recent weight loss.  Denies any new aches and pains anywhere.  ECOG PS- 1  Pain scale- 0   Review of systems- Review of Systems  Constitutional:  Positive for malaise/fatigue. Negative for chills, fever and weight loss.  HENT:  Negative for congestion, ear discharge and nosebleeds.   Eyes:  Negative for blurred vision.  Respiratory:  Negative for cough, hemoptysis, sputum production, shortness of breath and wheezing.   Cardiovascular:  Negative for chest pain, palpitations, orthopnea and claudication.  Gastrointestinal:  Negative for abdominal pain, blood in stool, constipation, diarrhea, heartburn, melena, nausea and vomiting.  Genitourinary:  Negative for dysuria, flank pain, frequency, hematuria and urgency.  Musculoskeletal:  Negative for back pain, joint pain and myalgias.  Skin:  Negative for rash.  Neurological:  Negative for dizziness, tingling, focal weakness, seizures, weakness and headaches.  Endo/Heme/Allergies:  Does not bruise/bleed easily.  Psychiatric/Behavioral:   Negative for depression and suicidal ideas. The patient does not have insomnia.    No Known Allergies  Patient Active Problem List   Diagnosis Date Noted   Mild anemia 05/10/2019   Hemiparesis affecting left side as late effect of cerebrovascular accident ([place]) 04/16/2019   Prediabetes 03/29/2019   Increased prostate specific antigen (PSA) velocity 03/29/2019   Lower urinary tract symptoms (LUTS) 02/22/2019   Impingement syndrome of shoulder, left 09/28/2017   Hearing loss 46/56/8127   Umbilical hernia without obstruction and without gangrene 06/21/2016   Medication monitoring encounter 02/23/2016   Hyperglycemia 02/04/2016   Left carotid bruit 02/03/2016   Arthritis of both hands 03/10/2015   Spider varicose veins 03/10/2015   Low serum thyroid stimulating hormone (TSH) 07/22/2014   Hyperlipidemia LDL goal <70 07/21/2014   Hypertension goal BP (blood pressure) < 140/90 07/21/2014   Peripheral arterial disease ([place]) 07/21/2014     Past Medical History:  Diagnosis Date   Family hx of colon cancer requiring screening colonoscopy 06/21/2016   Father at age 65   History of CVA (cerebrovascular accident) 06/22/2011   Overview:  right sided stroke in 2013 (basal ganglia, right insular region, and white matter of the left frontal lobe    Hyperlipidemia    Hypertension    Stroke ([place]) 2013     Past Surgical History:  Procedure Laterality Date   CYSTOSCOPY WITH RETROGRADE URETHROGRAM N/A 04/17/2019   Procedure: CYSTOSCOPY WITH BALLOON DILATION OF URETHRAL STRICTURE;  Surgeon: Abbie Sons, MD;  Location: [facility] ORS;  Service: Urology;  Laterality: N/A;   CYSTOSCOPY WITH URETHRAL DILATATION N/A 04/24/2020   Procedure: CYSTOSCOPY WITH  URETHRAL BALLOON DILATATION;  Surgeon: Billey Co, MD;  Location: [facility] ORS;  Service: Urology;  Laterality: N/A;   TONSILLECTOMY  1958   TONSILLECTOMY     UMBILICAL HERNIA REPAIR N/A 04/17/2019   Procedure: HERNIA REPAIR UMBILICAL ADULT;  Surgeon:  Olean Ree, MD;  Location: [facility] ORS;  Service: General;  Laterality: N/A;   URETHROTOMY N/A 04/17/2019   Procedure: DIRECT VISION INTERNAL URETHROTOMY;  Surgeon: Abbie Sons, MD;  Location: [facility] ORS;  Service: Urology;  Laterality: N/A;    Social History   Socioeconomic History   Marital status: Married    Spouse name: Not on file   Number of children: 1   Years of education: Not on file   Highest education level: High school graduate  Occupational History   Not on file  Tobacco Use   Smoking status: Never   Smokeless tobacco: Never  Vaping Use   Vaping Use: Never used  Substance and Sexual Activity   Alcohol use: Yes    Alcohol/week: 1.0 standard drink    Types: 1 Cans of beer per week    Comment: 1 beer daily   Drug use: No   Sexual activity: Yes  Other Topics Concern   Not on file  Social History Narrative   1 son - adopted   Social Determinants of Health   Financial Resource Strain: High Risk   Difficulty of Paying Living Expenses: Hard  Food Insecurity: Food Insecurity Present   Worried About Charity fundraiser in the Last Year: Sometimes true   Arboriculturist in the Last Year: Never true  Transportation Needs: No Transportation Needs   Lack of Transportation (Medical): No   Lack of Transportation (Non-Medical): No  Physical Activity: Sufficiently Active   Days of Exercise per Week: 5 days   Minutes of Exercise per Session: 30 min  Stress: No Stress Concern Present   Feeling of Stress : Only a little  Social Connections: Moderately Integrated   Frequency of Communication with Friends and Family: More than three times a week   Frequency of Social Gatherings with Friends and Family: Once a week   Attends Religious Services: More than 4 times per year   Active Member of [employer] or Organizations: No   Attends Music therapist: Never   Marital Status: Married  Human resources officer Violence: Not At Risk   Fear of Current or Ex-Partner: No    Emotionally Abused: No   Physically Abused: No   Sexually Abused: No     Family History  Problem Relation Age of Onset   Arthritis Mother    Kidney disease Mother    Heart disease Father    Cancer Father        lung, colon   Diabetes Brother    Cancer Brother        stomach tumor   Glaucoma Maternal Grandmother    Kidney disease Paternal Grandmother    Neuropathy Brother      Current Outpatient Medications:    aspirin EC 81 MG tablet, Take 1 tablet (81 mg total) by mouth daily., Disp: , Rfl:    atorvastatin (LIPITOR) 40 MG tablet, Take 1 tablet (40 mg total) by mouth daily. New cholesterol medication - in place of pravastatin, Disp: 90 tablet, Rfl: 1   hydrochlorothiazide (HYDRODIURIL) 25 MG tablet, Take 1 tablet by mouth daily., Disp: , Rfl:    losartan (COZAAR) 100 MG tablet, Take 1 tablet (100 mg total) by mouth daily.  New bp medication, Disp: 90 tablet, Rfl: 1   traZODone (DESYREL) 50 MG tablet, Take 0.5-1 tablets (25-50 mg total) by mouth at bedtime as needed for sleep. (Patient not taking: Reported on 10/28/2020), Disp: 90 tablet, Rfl: 0   Physical exam:  Vitals:   10/28/20 1450  BP: (!) 157/100  Pulse: 95  Resp: 16  Temp: 98 F (36.7 C)  SpO2: 98%  Weight: 156 lb 1.6 oz (70.8 kg)   Physical Exam Constitutional:      General: He is not in acute distress. Cardiovascular:     Rate and Rhythm: Normal rate and regular rhythm.     Heart sounds: Normal heart sounds.  Pulmonary:     Effort: Pulmonary effort is normal.     Breath sounds: Normal breath sounds.  Abdominal:     General: Bowel sounds are normal.     Palpations: Abdomen is soft.  Skin:    General: Skin is warm and dry.  Neurological:     Mental Status: He is alert and oriented to person, place, and time.       CMP Latest Ref Rng & Units 04/17/2020  Glucose 65 - 99 mg/dL 86  BUN 7 - 25 mg/dL 9  Creatinine 0.70 - 1.25 mg/dL 0.98  Sodium 135 - 146 mmol/L 138  Potassium 3.5 - 5.3 mmol/L 4.4   Chloride 98 - 110 mmol/L 105  CO2 20 - 32 mmol/L 27  Calcium 8.6 - 10.3 mg/dL 9.4  Total Protein 6.1 - 8.1 g/dL 7.0  Total Bilirubin 0.2 - 1.2 mg/dL 0.6  Alkaline Phos 40 - 115 U/L -  AST 10 - 35 U/L 21  ALT 9 - 46 U/L 17   CBC Latest Ref Rng & Units 10/28/2020  WBC 4.0 - 10.5 K/uL 7.5  Hemoglobin 13.0 - 17.0 g/dL 13.0  Hematocrit 39.0 - 52.0 % 38.6(L)  Platelets 150 - 400 K/uL 320   Assessment and plan- Patient is a 70 y.o. male referred for findings of abnormal MRI prostate  I have reviewed MRI prostate images independently and discussed findings with the patient and his wife.  MRI showsNodule in the right lobe of the prostate with extracapsular extension.  There is also concern for locoregional adenopathy as well as a possible bone lesion involving the proximal right femur 3.4 cm suspicious for bone metastases.  I will check a CBC with differential CMP and PSA today.  Obtain a PSMA PET scan to complete his staging work-up.  His recent PSA from July 2022 was elevated at 94 which is concerning for prostate cancer especially given the MRI findings.  We will be discussing his case at tumor board tomorrow to see what would be the best way to biopsy for tissue diagnosis.  Ideally I would prefer a nonbone sample.  Typically we need to send NGS testing and metastatic prostate cancer which is difficult to obtain with bone specimens.  I will tentatively see the patient back after biopsy and PSMA PET scan is back.  Discussed differences between castrate sensitive and castrate resistant prostate cancer and different modalities to treat prostate cancer.  Patient and his wife verbalized understanding of the plan   Thank you for this kind referral and the opportunity to participate in the care of this patient   Visit Diagnosis 1. Elevated PSA     Dr. Randa Evens, MD, MPH [facility] at [facility] [phone] 10/28/2020

## 2020-10-29 ENCOUNTER — Other Ambulatory Visit: Payer: Medicare HMO

## 2020-10-29 LAB — TESTOSTERONE: Testosterone: 356 ng/dL (ref 264–916)

## 2020-10-29 NOTE — Progress Notes (Signed)
Tumor Board Documentation  Xavier [name] was presented by Dr Janese Banks at our Tumor Board on 10/29/2020, which included representatives from radiation oncology, medical oncology, internal medicine, navigation, pathology, radiology, surgical, genetics, pulmonology, research.  Xavier [name] currently presents as a new patient, for discussion with history of the following treatments: active survellience.  Additionally, we reviewed previous medical and familial history, history of present illness, and recent lab results along with all available histopathologic and imaging studies. The tumor board considered available treatment options and made the following recommendations: Additional screening, Biopsy (PSMA PET)    The following procedures/referrals were also placed: No orders of the defined types were placed in this encounter.   Clinical Trial Status: not discussed   Staging used: To be determined AJCC Staging:       Group: ProstateCancer   National site-specific guidelines   were discussed with respect to the case.  Tumor board is a meeting of clinicians from various specialty areas who evaluate and discuss patients for whom a multidisciplinary approach is being considered. Final determinations in the plan of care are those of the provider(s). The responsibility for follow up of recommendations given during tumor board is that of the provider.   Today's extended care, comprehensive team conference, Reyn was not present for the discussion and was not examined.   Multidisciplinary Tumor Board is a multidisciplinary case peer review process.  Decisions discussed in the Multidisciplinary Tumor Board reflect the opinions of the specialists present at the conference without having examined the patient.  Ultimately, treatment and diagnostic decisions rest with the primary provider(s) and the patient.

## 2020-11-13 ENCOUNTER — Telehealth: Payer: Self-pay | Admitting: Oncology

## 2020-11-13 NOTE — Telephone Encounter (Signed)
Left VM with patient and wife about PET scan and change to MD f/u appointment. We just received auth for PET today.

## 2020-11-16 ENCOUNTER — Inpatient Hospital Stay: Payer: Medicare HMO | Admitting: Oncology

## 2020-11-30 ENCOUNTER — Ambulatory Visit
Admission: RE | Admit: 2020-11-30 | Discharge: 2020-11-30 | Disposition: A | Discharge status: Home / Self Care | Payer: Medicare HMO | Source: Ambulatory Visit | Attending: Oncology | Admitting: Oncology

## 2020-11-30 DIAGNOSIS — R972 Elevated prostate specific antigen [PSA]: Secondary | ICD-10-CM | POA: Insufficient documentation

## 2020-11-30 DIAGNOSIS — R590 Localized enlarged lymph nodes: Secondary | ICD-10-CM | POA: Diagnosis not present

## 2020-11-30 DIAGNOSIS — C7951 Secondary malignant neoplasm of bone: Secondary | ICD-10-CM | POA: Diagnosis not present

## 2020-11-30 MED ORDER — PIFLIFOLASTAT F 18 (PYLARIFY) INJECTION
9.0000 | Freq: Once | INTRAVENOUS | Status: AC
  Administered 2020-11-30: 9.51 via INTRAVENOUS

## 2020-12-07 ENCOUNTER — Encounter: Payer: Self-pay | Admitting: Oncology

## 2020-12-07 ENCOUNTER — Other Ambulatory Visit: Payer: Self-pay

## 2020-12-07 ENCOUNTER — Inpatient Hospital Stay: Payer: Medicare HMO | Attending: Oncology | Admitting: Oncology

## 2020-12-07 VITALS — BP 153/108 | HR 84 | Temp 98.7°F | Resp 16 | Ht 63.0 in | Wt 154.4 lb

## 2020-12-07 DIAGNOSIS — C7951 Secondary malignant neoplasm of bone: Secondary | ICD-10-CM | POA: Diagnosis not present

## 2020-12-07 DIAGNOSIS — C61 Malignant neoplasm of prostate: Secondary | ICD-10-CM | POA: Diagnosis not present

## 2020-12-07 DIAGNOSIS — Z7189 Other specified counseling: Secondary | ICD-10-CM | POA: Diagnosis not present

## 2020-12-08 ENCOUNTER — Other Ambulatory Visit: Payer: Self-pay | Admitting: Oncology

## 2020-12-08 ENCOUNTER — Encounter: Payer: Self-pay | Admitting: Oncology

## 2020-12-08 DIAGNOSIS — Z7189 Other specified counseling: Secondary | ICD-10-CM | POA: Insufficient documentation

## 2020-12-08 DIAGNOSIS — C61 Malignant neoplasm of prostate: Secondary | ICD-10-CM | POA: Insufficient documentation

## 2020-12-08 NOTE — Progress Notes (Signed)
Hematology/Oncology Consult note [facility]  Telephone:([phone] [phone]  Patient Care Team: Steele Sizer, MD as PCP - General (Family Medicine)   Name of the patient: Xavier [name]  [id number]  [birth year]   Date of visit: 12/08/20  Diagnosis-localized prostate cancer  Chief complaint/ Reason for visit-discuss PSMA PET scan results and further management  Heme/Onc history: patient is a 70 year old male with history of urethral Stricture disease and atonic bladder for which patient undergoes self catheterization 3 times a day.  He has been following up with urology in the past as well.  His PSA when checked in July 2022 was elevated at 17.4.  Prior to that his PSA was 4, 6 years ago.  Patient is here with his wife today.  States that he is doing well overall and is independent of his ADLs and IADLs.  He tries to workout every day.  Denies any changes in his appetite or weight.  Denies any recent weight loss.  Denies any new aches and pains anywhere.   PSMA PET scan showed intense radiotracer activity in the right lobe of the prostate.  Findings consistent with high-grade prostate adenocarcinoma.  No evidence of capsular extension or seminal vesicle involvement.  No evidence of metastatic pelvic adenopathy.  Lytic lesion in the proximal right femur measuring about 3.7 cm with minimal radiotracer activity and not favored to represent prostate cancer metastases.  Interval history-overall patient is doing well denies any new complaints at this time.  ECOG PS- 1 Pain scale- 0   Review of systems- Review of Systems  Constitutional:  Negative for chills, fever, malaise/fatigue and weight loss.  HENT:  Negative for congestion, ear discharge and nosebleeds.   Eyes:  Negative for blurred vision.  Respiratory:  Negative for cough, hemoptysis, sputum production, shortness of breath and wheezing.   Cardiovascular:  Negative for chest pain,  palpitations, orthopnea and claudication.  Gastrointestinal:  Negative for abdominal pain, blood in stool, constipation, diarrhea, heartburn, melena, nausea and vomiting.  Genitourinary:  Negative for dysuria, flank pain, frequency, hematuria and urgency.  Musculoskeletal:  Negative for back pain, joint pain and myalgias.  Skin:  Negative for rash.  Neurological:  Negative for dizziness, tingling, focal weakness, seizures, weakness and headaches.  Endo/Heme/Allergies:  Does not bruise/bleed easily.  Psychiatric/Behavioral:  Negative for depression and suicidal ideas. The patient does not have insomnia.      No Known Allergies   Past Medical History:  Diagnosis Date   Family hx of colon cancer requiring screening colonoscopy 06/21/2016   Father at age 8   History of CVA (cerebrovascular accident) 06/22/2011   Overview:  right sided stroke in 2013 (basal ganglia, right insular region, and white matter of the left frontal lobe    Hyperlipidemia    Hypertension    Stroke [facility]) 2013     Past Surgical History:  Procedure Laterality Date   CYSTOSCOPY WITH RETROGRADE URETHROGRAM N/A 04/17/2019   Procedure: CYSTOSCOPY WITH BALLOON DILATION OF URETHRAL STRICTURE;  Surgeon: Abbie Sons, MD;  Location: [facility] ORS;  Service: Urology;  Laterality: N/A;   CYSTOSCOPY WITH URETHRAL DILATATION N/A 04/24/2020   Procedure: CYSTOSCOPY WITH URETHRAL BALLOON DILATATION;  Surgeon: Billey Co, MD;  Location: [facility] ORS;  Service: Urology;  Laterality: N/A;   TONSILLECTOMY  1958   TONSILLECTOMY     UMBILICAL HERNIA REPAIR N/A 04/17/2019   Procedure: HERNIA REPAIR UMBILICAL ADULT;  Surgeon: Olean Ree, MD;  Location: [facility] ORS;  Service: General;  Laterality: N/A;   URETHROTOMY N/A 04/17/2019   Procedure: DIRECT VISION INTERNAL URETHROTOMY;  Surgeon: Abbie Sons, MD;  Location: [facility] ORS;  Service: Urology;  Laterality: N/A;    Social History   Socioeconomic History   Marital status: Married     Spouse name: Not on file   Number of children: 1   Years of education: Not on file   Highest education level: High school graduate  Occupational History   Not on file  Tobacco Use   Smoking status: Never   Smokeless tobacco: Never  Vaping Use   Vaping Use: Never used  Substance and Sexual Activity   Alcohol use: Yes    Alcohol/week: 1.0 standard drink    Types: 1 Cans of beer per week    Comment: 1 beer daily   Drug use: No   Sexual activity: Yes  Other Topics Concern   Not on file  Social History Narrative   1 son - adopted   Social Determinants of Health   Financial Resource Strain: High Risk   Difficulty of Paying Living Expenses: Hard  Food Insecurity: Food Insecurity Present   Worried About Charity fundraiser in the Last Year: Sometimes true   Arboriculturist in the Last Year: Never true  Transportation Needs: No Transportation Needs   Lack of Transportation (Medical): No   Lack of Transportation (Non-Medical): No  Physical Activity: Sufficiently Active   Days of Exercise per Week: 5 days   Minutes of Exercise per Session: 30 min  Stress: No Stress Concern Present   Feeling of Stress : Only a little  Social Connections: Moderately Integrated   Frequency of Communication with Friends and Family: More than three times a week   Frequency of Social Gatherings with Friends and Family: Once a week   Attends Religious Services: More than 4 times per year   Active Member of [employer] or Organizations: No   Attends Music therapist: Never   Marital Status: Married  Human resources officer Violence: Not At Risk   Fear of Current or Ex-Partner: No   Emotionally Abused: No   Physically Abused: No   Sexually Abused: No    Family History  Problem Relation Age of Onset   Arthritis Mother    Kidney disease Mother    Heart disease Father    Cancer Father        lung, colon   Diabetes Brother    Cancer Brother        stomach tumor   Glaucoma Maternal Grandmother     Kidney disease Paternal Grandmother    Neuropathy Brother      Current Outpatient Medications:    aspirin EC 81 MG tablet, Take 1 tablet (81 mg total) by mouth daily., Disp: , Rfl:    atorvastatin (LIPITOR) 40 MG tablet, Take 1 tablet (40 mg total) by mouth daily. New cholesterol medication - in place of pravastatin, Disp: 90 tablet, Rfl: 1   hydrochlorothiazide (HYDRODIURIL) 25 MG tablet, Take 1 tablet by mouth daily., Disp: , Rfl:    losartan (COZAAR) 100 MG tablet, Take 1 tablet (100 mg total) by mouth daily. New bp medication, Disp: 90 tablet, Rfl: 1   traZODone (DESYREL) 50 MG tablet, Take 0.5-1 tablets (25-50 mg total) by mouth at bedtime as needed for sleep., Disp: 90 tablet, Rfl: 0  Physical exam:  Vitals:   12/07/20 1133  BP: (!) 153/108  Pulse: 84  Resp: 16  Temp: 98.7 F (  37.1 C)  TempSrc: Tympanic  Weight: 154 lb 6.4 oz (70 kg)  Height: 5\' 3"  (1.6 m)   Physical Exam Cardiovascular:     Rate and Rhythm: Normal rate and regular rhythm.     Heart sounds: Normal heart sounds.  Pulmonary:     Effort: Pulmonary effort is normal.     Breath sounds: Normal breath sounds.  Skin:    General: Skin is warm and dry.  Neurological:     Mental Status: He is alert and oriented to person, place, and time.     CMP Latest Ref Rng & Units 10/28/2020  Glucose 70 - 99 mg/dL 110(H)  BUN 8 - 23 mg/dL 12  Creatinine 0.61 - 1.24 mg/dL 1.11  Sodium 135 - 145 mmol/L 133(L)  Potassium 3.5 - 5.1 mmol/L 4.4  Chloride 98 - 111 mmol/L 100  CO2 22 - 32 mmol/L 27  Calcium 8.9 - 10.3 mg/dL 8.8(L)  Total Protein 6.5 - 8.1 g/dL 7.9  Total Bilirubin 0.3 - 1.2 mg/dL 0.9  Alkaline Phos 38 - 126 U/L 85  AST 15 - 41 U/L 20  ALT 0 - 44 U/L 17   CBC Latest Ref Rng & Units 10/28/2020  WBC 4.0 - 10.5 K/uL 7.5  Hemoglobin 13.0 - 17.0 g/dL 13.0  Hematocrit 39.0 - 52.0 % 38.6(L)  Platelets 150 - 400 K/uL 320      NM PET (PSMA) SKULL TO MID THIGH  Addendum Date: 12/07/2020   ADDENDUM  REPORT: 12/07/2020 14:21 ADDENDUM: Correction Impression: 4. Lytic lesion within the proximal RIGHT femur with low background radiotracer activity is NOT favored prostate cancer skeletal metastasis. Electronically Signed   By: Suzy Bouchard M.D.   On: 12/07/2020 14:21   Result Date: 12/07/2020 CLINICAL DATA:  Elevated PSA equal 9.5.  Abnormal prostate MRI. EXAM: NUCLEAR MEDICINE PET SKULL BASE TO THIGH TECHNIQUE: 9.5 mCi F18 Piflufolastat (Pylarify) was injected intravenously. Full-ring PET imaging was performed from the skull base to thigh after the radiotracer. CT data was obtained and used for attenuation correction and anatomic localization. COMPARISON:  None. FINDINGS: NECK No radiotracer activity in neck lymph nodes. Incidental CT finding: None CHEST No radiotracer accumulation within mediastinal or hilar lymph nodes. No suspicious pulmonary nodules on the CT scan. Incidental CT finding: None ABDOMEN/PELVIS Prostate: Focal intense radiotracer activity in the RIGHT lobe of the prostate gland corresponds to to abnormal findings on comparison prostate MRI. Activity is intense with SUV max equal 4.3. Lesion measures approximately 1.5 cm. No evidence of extracapsular extension by PET imaging. Lymph nodes: RIGHT internal iliac lymph node measuring 5 mm (image 207/series 3) just anterior to the SI joint does not have significant radiotracer activity. small LEFT internal iliac lymph node measuring 5 mm along the operator fossa without radiotracer activity (image 219/series 3). No radiotracer avid periaortic lymph nodes. Liver: No evidence of liver metastasis Incidental CT finding: None SKELETON Lytic lesion in the proximal RIGHT femur measures 3.7 cm (image 240/series 3) with minimal radiotracer activity similar to background. Symmetric sclerosis along the SI joints. IMPRESSION: 1. Intense radiotracer activity within the RIGHT lobe the prostate gland corresponds to lesion on comparison MRI. Findings consistent  with high-grade prostate adenocarcinoma. No evidence of after capsular extension or seminal vesicle involvement by PET-CT imaging (less spatial resolution MRI, however warrants consideration). 2. No evidence metastatic pelvic lymphadenopathy. Small pelvic lymph nodes without radiotracer activity. No periaortic retroperitoneal metastatic nodes. 3. No visceral or skeletal metastasis. 4. Lytic lesion within the proximal RIGHT  femur with low background radiotracer activity is favored NOT favored prostate cancer skeletal metastasis. Electronically Signed: By: Suzy Bouchard M.D. On: 12/01/2020 13:56     Assessment and plan- Patient is a 70 y.o. male with what appears to be localized prostate cancer here to discuss PSMA PET results and further management  PSMA PET scan shows intense radiotracer activity in the region of the prostate but no evidence of locoregional adenopathy.  Interestingly patient has a 3 cm lytic lesion in his right femur which did not light up on the PSMA PET scan.  It is therefore not clear if this femur lesion is metastatic prostate cancer versus an unrelated lesion.  I have discussed patient's case with Dr. Diamantina Providence.  Plan is to proceed with CT-guided biopsy of the right femur lesion if feasible.  If bone biopsy confirms prostate cancer then there is no need for prostate biopsy.  If bone biopsy is negative then consideration needs to be given for prostate biopsy which will be coordinated by Dr. Diamantina Providence  Patient's PSA is elevated at 29 and I will refer the patient to Dr. Donella Stade at this time for definitive radiation/brachytherapy to his prostate.  As per my conversation with Dr.Sninsky, given that patient has an atonic bladder and urethral stricture he does not think definitive surgery would be a viable option at this time although he is willing to discuss this further with Dr. Erlene Quan based on femur biopsy results.  I will tentatively see the patient in 5 to 6 weeks time and I will plan  to start ADT at that time.  However I would prefer if the patient has a confirmed tissue diagnosis rather than to treat this empirically as prostate cancer based on PSMA PET scan and PSA   Visit Diagnosis 1. Goals of care, counseling/discussion   2. Prostate cancer [facility])      Dr. Randa Evens, MD, MPH [facility] at [facility] [phone] 12/08/2020 5:42 PM

## 2020-12-08 NOTE — Addendum Note (Signed)
Addended by: Randa Evens C on: 12/08/2020 06:00 PM   Modules accepted: Orders

## 2020-12-09 ENCOUNTER — Other Ambulatory Visit: Payer: Self-pay

## 2020-12-09 ENCOUNTER — Other Ambulatory Visit: Payer: Self-pay | Admitting: *Deleted

## 2020-12-09 DIAGNOSIS — C61 Malignant neoplasm of prostate: Secondary | ICD-10-CM

## 2020-12-09 DIAGNOSIS — Z7189 Other specified counseling: Secondary | ICD-10-CM

## 2020-12-10 ENCOUNTER — Telehealth: Payer: Self-pay | Admitting: *Deleted

## 2020-12-10 NOTE — Telephone Encounter (Signed)
I called the patient today on the phone number that was in the system and it did not say his name so I was not sure if I could leave a message.  And I called his wife and she states that the number in the computer is incorrect it is really [phone].  When I called that number it says there is no voicemail set up. I called back to pt and he says he will go and get an xray tom and then when I get results I will check it out with IR doctor to see if bone bx is needed. Pt agreeable and I will call once I get there results and speak to IR doctor

## 2020-12-11 ENCOUNTER — Ambulatory Visit
Admission: RE | Admit: 2020-12-11 | Discharge: 2020-12-11 | Disposition: A | Discharge status: Home / Self Care | Payer: Medicare HMO | Attending: Oncology | Admitting: Oncology

## 2020-12-11 ENCOUNTER — Ambulatory Visit
Admission: RE | Admit: 2020-12-11 | Discharge: 2020-12-11 | Disposition: A | Discharge status: Home / Self Care | Payer: Medicare HMO | Source: Ambulatory Visit | Attending: Oncology | Admitting: Oncology

## 2020-12-11 DIAGNOSIS — C61 Malignant neoplasm of prostate: Secondary | ICD-10-CM | POA: Diagnosis not present

## 2020-12-11 DIAGNOSIS — M898X5 Other specified disorders of bone, thigh: Secondary | ICD-10-CM | POA: Diagnosis not present

## 2020-12-14 ENCOUNTER — Ambulatory Visit: Payer: Medicare HMO | Admitting: Radiation Oncology

## 2020-12-16 ENCOUNTER — Telehealth: Payer: Self-pay

## 2020-12-16 NOTE — Telephone Encounter (Signed)
Called pt, no answer. LM for pt to call back to schedule prostate biopsy. 1st attempt.

## 2020-12-16 NOTE — Telephone Encounter (Signed)
-----   Message from Billey Co, MD sent at 12/16/2020  2:32 PM EST ----- Regarding: schedule prostate biopsy Please call patient to schedule prostate biopsy and review instructions, please try to schedule ASAP within the next 2 weeks, thank you  Nickolas Madrid, MD 12/16/2020

## 2020-12-17 ENCOUNTER — Encounter: Payer: Self-pay | Admitting: Radiation Oncology

## 2020-12-17 ENCOUNTER — Other Ambulatory Visit: Payer: Self-pay

## 2020-12-17 ENCOUNTER — Ambulatory Visit
Admission: RE | Admit: 2020-12-17 | Discharge: 2020-12-17 | Disposition: A | Discharge status: Home / Self Care | Payer: Medicare HMO | Source: Ambulatory Visit | Attending: Radiation Oncology | Admitting: Radiation Oncology

## 2020-12-17 VITALS — BP 172/99 | HR 74 | Temp 97.0°F | Resp 16 | Wt 156.1 lb

## 2020-12-17 DIAGNOSIS — Z79899 Other long term (current) drug therapy: Secondary | ICD-10-CM | POA: Insufficient documentation

## 2020-12-17 DIAGNOSIS — N312 Flaccid neuropathic bladder, not elsewhere classified: Secondary | ICD-10-CM | POA: Diagnosis not present

## 2020-12-17 DIAGNOSIS — C61 Malignant neoplasm of prostate: Secondary | ICD-10-CM | POA: Diagnosis not present

## 2020-12-17 DIAGNOSIS — Z7982 Long term (current) use of aspirin: Secondary | ICD-10-CM | POA: Diagnosis not present

## 2020-12-17 DIAGNOSIS — Z800 Family history of malignant neoplasm of digestive organs: Secondary | ICD-10-CM | POA: Diagnosis not present

## 2020-12-17 NOTE — Consult Note (Signed)
NEW PATIENT EVALUATION  Name: Xavier [name]  MRN: [id number]  Date:   12/17/2020     DOB: 1950/06/05   This 70 y.o. male patient presents to the clinic for initial evaluation of as yet not completely staged or biopsied prostate cancer.  REFERRING PHYSICIAN: Steele Sizer, MD  CHIEF COMPLAINT:  Chief Complaint  Patient presents with   Prostate Cancer    Initial consultation    DIAGNOSIS: The encounter diagnosis was Prostate cancer ([place]).   PREVIOUS INVESTIGATIONS:  PET CT scan MRI scans reviewed Pathology pending Clinical notes reviewed  HPI: Patient is a 70 year old male who self catheterizes based on a atonic bladder.  His PSA has been climbing most recently was elevated in July 2017 0.4.  He underwent a MRI of his prostate back in July showing a 2 cm peripheral zone nodule in the right posterior lateral mid gland and base highly suspicious for high-grade carcinoma a P RADS 5 lesion.  There was mild extracapsular extension and possible involvement of the right seminal vesicle.  He also had some shotty subcentimeter internal iliac bilateral external iliac and bilateral inguinal lymphadenopathy which may be suspicious for metastatic disease.  PET scan demonstrated hypermetabolic activity in the right lobe of the prostate consistent with a lesion on MRI scan.  There was no evidence of capsular extension or seminal vesicle involvement by PET/CT imaging.  No evidence of metastatic pelvic lymphadenopathy.  He did have a lytic lesion within the proximal right femur with very low hypermetabolic activity  compatible with a benign lesion and on plain films it was a benign appearing lytic lesion within the proximal right femur without evidence of fracture or dislocation.  Patient has not had prostate biopsy done at this time and that is being scheduled with urology. PLANNED TREATMENT REGIMEN: To be determined by prostate biopsy  PAST MEDICAL HISTORY:  has a past medical history of Family hx  of colon cancer requiring screening colonoscopy (06/21/2016), History of CVA (cerebrovascular accident) (06/22/2011), Hyperlipidemia, Hypertension, and Stroke ([place]) (2013).    PAST SURGICAL HISTORY:  Past Surgical History:  Procedure Laterality Date   CYSTOSCOPY WITH RETROGRADE URETHROGRAM N/A 04/17/2019   Procedure: CYSTOSCOPY WITH BALLOON DILATION OF URETHRAL STRICTURE;  Surgeon: Abbie Sons, MD;  Location: [facility] ORS;  Service: Urology;  Laterality: N/A;   CYSTOSCOPY WITH URETHRAL DILATATION N/A 04/24/2020   Procedure: CYSTOSCOPY WITH URETHRAL BALLOON DILATATION;  Surgeon: Billey Co, MD;  Location: [facility] ORS;  Service: Urology;  Laterality: N/A;   TONSILLECTOMY  1958   TONSILLECTOMY     UMBILICAL HERNIA REPAIR N/A 04/17/2019   Procedure: HERNIA REPAIR UMBILICAL ADULT;  Surgeon: Olean Ree, MD;  Location: [facility] ORS;  Service: General;  Laterality: N/A;   URETHROTOMY N/A 04/17/2019   Procedure: DIRECT VISION INTERNAL URETHROTOMY;  Surgeon: Abbie Sons, MD;  Location: [facility] ORS;  Service: Urology;  Laterality: N/A;    FAMILY HISTORY: family history includes Arthritis in his mother; Cancer in his brother and father; Diabetes in his brother; Glaucoma in his maternal grandmother; Heart disease in his father; Kidney disease in his mother and paternal grandmother; Neuropathy in his brother.  SOCIAL HISTORY:  reports that he has never smoked. He has never used smokeless tobacco. He reports current alcohol use of about 1.0 standard drink per week. He reports that he does not use drugs.  ALLERGIES: Patient has no known allergies.  MEDICATIONS:  Current Outpatient Medications  Medication Sig Dispense Refill   aspirin EC 81 MG tablet  Take 1 tablet (81 mg total) by mouth daily.     atorvastatin (LIPITOR) 40 MG tablet Take 1 tablet (40 mg total) by mouth daily. New cholesterol medication - in place of pravastatin 90 tablet 1   hydrochlorothiazide (HYDRODIURIL) 25 MG tablet Take 1 tablet by  mouth daily.     losartan (COZAAR) 100 MG tablet Take 1 tablet (100 mg total) by mouth daily. New bp medication 90 tablet 1   traZODone (DESYREL) 50 MG tablet Take 0.5-1 tablets (25-50 mg total) by mouth at bedtime as needed for sleep. 90 tablet 0   No current facility-administered medications for this encounter.    ECOG PERFORMANCE STATUS:  0 - Asymptomatic  REVIEW OF SYSTEMS: Patient denies any weight loss, fatigue, weakness, fever, chills or night sweats. Patient denies any loss of vision, blurred vision. Patient denies any ringing  of the ears or hearing loss. No irregular heartbeat. Patient denies heart murmur or history of fainting. Patient denies any chest pain or pain radiating to her upper extremities. Patient denies any shortness of breath, difficulty breathing at night, cough or hemoptysis. Patient denies any swelling in the lower legs. Patient denies any nausea vomiting, vomiting of blood, or coffee ground material in the vomitus. Patient denies any stomach pain. Patient states has had normal bowel movements no significant constipation or diarrhea. Patient denies any dysuria, hematuria or significant nocturia. Patient denies any problems walking, swelling in the joints or loss of balance. Patient denies any skin changes, loss of hair or loss of weight. Patient denies any excessive worrying or anxiety or significant depression. Patient denies any problems with insomnia. Patient denies excessive thirst, polyuria, polydipsia. Patient denies any swollen glands, patient denies easy bruising or easy bleeding. Patient denies any recent infections, allergies or URI. Patient "s visual fields have not changed significantly in recent time.   PHYSICAL EXAM: BP (!) 172/99 (BP Location: Left Arm, Patient Position: Sitting)   Pulse 74   Temp (!) 97 F (36.1 C) (Tympanic)   Resp 16   Wt 156 lb 1.6 oz (70.8 kg)   BMI 27.65 kg/m  Well-developed well-nourished patient in NAD. HEENT reveals PERLA, EOMI,  discs not visualized.  Oral cavity is clear. No oral mucosal lesions are identified. Neck is clear without evidence of cervical or supraclavicular adenopathy. Lungs are clear to A&P. Cardiac examination is essentially unremarkable with regular rate and rhythm without murmur rub or thrill. Abdomen is benign with no organomegaly or masses noted. Motor sensory and DTR levels are equal and symmetric in the upper and lower extremities. Cranial nerves II through XII are grossly intact. Proprioception is intact. No peripheral adenopathy or edema is identified. No motor or sensory levels are noted. Crude visual fields are within normal range.  LABORATORY DATA: Labs reviewed pathology pending    RADIOLOGY RESULTS: MRI scan and PET CT scans reviewed compatible with above-stated findings   IMPRESSION: Possibly high-grade at least stage IIb adenocarcinoma the prostate as yet unbiopsied in 70 year old male  PLAN: At this time we are arranging for a biopsy with urology.  I will see the patient shortly thereafter in follow-up for determination of treatment plan.  He sounds like he may need laterally prostate but pelvic node radiation therapy as well as ADT therapy for at least 2 to 3 years.  All of this was reviewed with the patient.  Risks and benefits of radiation including increased lower urinary tract symptoms diarrhea fatigue alteration of blood counts all were discussed in detail with  the patient.  He comprehends my recommendations well.  We will see him in follow-up after his biopsies are complete.  Patient knows to call with any concerns.  I would like to take this opportunity to thank you for allowing me to participate in the care of your patient.Noreene Filbert, MD

## 2020-12-18 NOTE — Telephone Encounter (Signed)
Received message from [facility] at cancer center stating that she informed pt of his prostate biopsy appointment at his office visit with oncology.

## 2020-12-23 ENCOUNTER — Other Ambulatory Visit: Payer: Medicare HMO | Admitting: Urology

## 2020-12-28 ENCOUNTER — Encounter: Payer: Self-pay | Admitting: Urology

## 2020-12-28 ENCOUNTER — Ambulatory Visit (INDEPENDENT_AMBULATORY_CARE_PROVIDER_SITE_OTHER): Payer: Medicare HMO | Admitting: Urology

## 2020-12-28 ENCOUNTER — Other Ambulatory Visit: Payer: Self-pay

## 2020-12-28 VITALS — BP 142/80 | HR 90 | Ht 63.0 in | Wt 156.0 lb

## 2020-12-28 DIAGNOSIS — C61 Malignant neoplasm of prostate: Secondary | ICD-10-CM

## 2020-12-28 DIAGNOSIS — R972 Elevated prostate specific antigen [PSA]: Secondary | ICD-10-CM

## 2020-12-28 DIAGNOSIS — Z298 Encounter for other specified prophylactic measures: Secondary | ICD-10-CM | POA: Diagnosis not present

## 2020-12-28 MED ORDER — LEVOFLOXACIN 500 MG PO TABS
500.0000 mg | ORAL_TABLET | Freq: Once | ORAL | Status: AC
  Administered 2020-12-28: 500 mg via ORAL

## 2020-12-28 MED ORDER — GENTAMICIN SULFATE 40 MG/ML IJ SOLN
80.0000 mg | Freq: Once | INTRAMUSCULAR | Status: AC
  Administered 2020-12-28: 80 mg via INTRAMUSCULAR

## 2020-12-28 NOTE — Patient Instructions (Signed)

## 2020-12-28 NOTE — Progress Notes (Signed)
° °  12/28/20  Indication: Elevated PSA, 29.5  History of urethral stricture disease as well as atonic bladder, managed with intermittent catheterization 3-4 times daily.  Has also had persistently elevated PSA with prostate MRI showing 2 cm right PI-RADS 5 peripheral zone nodule suspicious for prostate cancer.  MRI also showed suspected early lymphadenopathy as well as a large bone lesion in the proximal femur he was referred to oncology for biopsy, however there was evidence of lymphadenopathy on PSMA PET scan, and bone lesion seen on MRI was felt to be benign.  Prostate Biopsy Procedure   Informed consent was obtained, and we discussed the risks of bleeding and infection/sepsis. A time out was performed to ensure correct patient identity.  Pre-Procedure: - Last PSA Level: 29.5 - Gentamicin and levaquin given for antibiotic prophylaxis - Transrectal Ultrasound performed revealing a 17 gm prostate -Significant hypoechoic region at right lateral base and mid gland consistent with MRI findings of PI-RADS 5 lesion - No median lobe  Procedure: - Prostate block performed using 10 cc 1% lidocaine and biopsies taken from sextant areas, a total of 12 under ultrasound guidance.  Post-Procedure: - Patient tolerated the procedure well - He was counseled to seek immediate medical attention if experiences significant bleeding, fevers, or severe pain - Return in one week to discuss biopsy results  Assessment/ Plan: Will follow up in 1-2 weeks to discuss pathology Continue intermittent catheterization 3-4 times per day, counseled on hematuria Anticipate pursuing ADT and radiation with Dr. Baruch Gouty if biopsy positive  Xavier Madrid, MD 12/28/2020

## 2020-12-30 ENCOUNTER — Ambulatory Visit
Admission: RE | Admit: 2020-12-30 | Discharge: 2020-12-30 | Disposition: A | Discharge status: Home / Self Care | Payer: Medicare HMO | Source: Ambulatory Visit | Attending: Radiation Oncology | Admitting: Radiation Oncology

## 2020-12-30 ENCOUNTER — Telehealth: Payer: Self-pay

## 2020-12-30 ENCOUNTER — Other Ambulatory Visit: Payer: Self-pay

## 2020-12-30 ENCOUNTER — Encounter: Payer: Self-pay | Admitting: Radiation Oncology

## 2020-12-30 VITALS — BP 140/101 | HR 83 | Temp 98.7°F | Wt 156.3 lb

## 2020-12-30 DIAGNOSIS — C61 Malignant neoplasm of prostate: Secondary | ICD-10-CM | POA: Diagnosis not present

## 2020-12-30 LAB — SURGICAL PATHOLOGY

## 2020-12-30 NOTE — Progress Notes (Signed)
Radiation Oncology Follow up Note  Name: Pratt Bress   Date:   12/30/2020 MRN:  [id number] DOB: 1950/02/02    This 70 y.o. male presents to the clinic today for results of his prostate biopsy with stage IIa mostly Gleason 6 (3+3) adenocarcinoma the prostate.  REFERRING PROVIDER: Steele Sizer, MD  HPI: Patient is a 70 year old male originally consulted back December 8.  He had undergone an MRI of his prostate showing a 2 cm peripheral zone nodule in the right posterior lateral mid gland suspicious for high-grade carcinoma.  He also has some internal iliac bilateral nodes PET scan demonstrated hypermetabolic to be in the right lobe of the prostate consistent with the MRI lesion.  No evidence of capsular extension seminal vesicle involvement or metastatic lymphadenopathy by PET/CT criteria.  He did have a lytic lesion in the proximal right femur although this was PET negative and films were consistent with a benign process.  He underwent biopsy.  Showing 5 of 12 cores positive for mostly Gleason 6 (3+3) although there was some Gleason 7 (3+4).  He is here today to discuss treatment options.  COMPLICATIONS OF TREATMENT: none  FOLLOW UP COMPLIANCE: keeps appointments   PHYSICAL EXAM:  BP (!) 140/101    Pulse 83    Temp 98.7 F (37.1 C) (Tympanic)    Wt 156 lb 4.8 oz (70.9 kg)    BMI 27.69 kg/m  Well-developed well-nourished patient in NAD. HEENT reveals PERLA, EOMI, discs not visualized.  Oral cavity is clear. No oral mucosal lesions are identified. Neck is clear without evidence of cervical or supraclavicular adenopathy. Lungs are clear to A&P. Cardiac examination is essentially unremarkable with regular rate and rhythm without murmur rub or thrill. Abdomen is benign with no organomegaly or masses noted. Motor sensory and DTR levels are equal and symmetric in the upper and lower extremities. Cranial nerves II through XII are grossly intact. Proprioception is intact. No peripheral  adenopathy or edema is identified. No motor or sensory levels are noted. Crude visual fields are within normal range.  RADIOLOGY RESULTS: PET CT scan reviewed  PLAN: The present time we have discussed going ahead with image guided IMRT radiation therapy.  Would plan on delivering 39 [place]line Cables to his prostate.  Based on his [facility] nomogram he does not need pelvic lymph node treatment.  I have asked urology to place fiducial markers in his prostate for daily image guided treatment.  Of also asked them to provide a 87-month dose of Eligard.  Patient comprehends my recommendations well.  We will simulate him after markers are placed.  I would like to take this opportunity to thank you for allowing me to participate in the care of your patient.Noreene Filbert, MD

## 2020-12-30 NOTE — Telephone Encounter (Signed)
PA started for Eligard via [employer].

## 2020-12-31 NOTE — Telephone Encounter (Signed)
Incoming approval of PA for [employer].  Dates: 12/31/20 - 07/01/21.   Pt scheduled.

## 2021-01-06 ENCOUNTER — Other Ambulatory Visit: Payer: Medicare HMO | Admitting: Urology

## 2021-01-06 DIAGNOSIS — R339 Retention of urine, unspecified: Secondary | ICD-10-CM | POA: Diagnosis not present

## 2021-01-13 ENCOUNTER — Ambulatory Visit (INDEPENDENT_AMBULATORY_CARE_PROVIDER_SITE_OTHER): Payer: Medicare HMO | Admitting: Urology

## 2021-01-13 ENCOUNTER — Encounter: Payer: Self-pay | Admitting: Urology

## 2021-01-13 ENCOUNTER — Other Ambulatory Visit: Payer: Self-pay

## 2021-01-13 VITALS — BP 172/94 | HR 66 | Ht 63.0 in | Wt 154.0 lb

## 2021-01-13 DIAGNOSIS — C61 Malignant neoplasm of prostate: Secondary | ICD-10-CM

## 2021-01-13 MED ORDER — GENTAMICIN SULFATE 40 MG/ML IJ SOLN
80.0000 mg | Freq: Once | INTRAMUSCULAR | Status: AC
  Administered 2021-01-13: 80 mg via INTRAMUSCULAR

## 2021-01-13 MED ORDER — LEUPROLIDE ACETATE (6 MONTH) 45 MG ~~LOC~~ KIT
45.0000 mg | PACK | Freq: Once | SUBCUTANEOUS | Status: AC
  Administered 2021-01-13: 45 mg via SUBCUTANEOUS

## 2021-01-13 MED ORDER — LEVOFLOXACIN 500 MG PO TABS
500.0000 mg | ORAL_TABLET | Freq: Once | ORAL | Status: AC
  Administered 2021-01-13: 500 mg via ORAL

## 2021-01-13 NOTE — Progress Notes (Signed)
Eligard SubQ Injection   Due to Prostate Cancer patient is present today for a Eligard Injection.  Medication: Eligard 6 month Dose: 45 mg  Location: right  Lot: [id number]  Exp: 05/2022  Patient tolerated well, no complications were noted.  Performed by: Gordy Clement, [place]  Per Dr. Diamantina Providence patient to only receive one time dose . Patient advised to begin  Vitamin D 800-1000iu and Calium 1000-1200mg  daily while on Androgen Deprivation Therapy.

## 2021-01-13 NOTE — Progress Notes (Signed)
° °  01/13/21  Indication: Favorable intermediate risk prostate cancer  He also has a history of urethral stricture disease and atonic bladder, which he manages with intermittent catheterization 3-4 times daily.  Prostate gold seed marker procedure   Informed consent was obtained, and we discussed the risks of bleeding and infection/sepsis. A time out was performed to ensure correct patient identity.  Pre-Procedure: - Gentamicin and levaquin given for antibiotic prophylaxis   Procedure: -A total of 3 gold seed markers were placed, 2 at the bilateral bases, and 1 seed at the apex  Post-Procedure: - Patient tolerated the procedure well - He was counseled to seek immediate medical attention if experiences significant bleeding, fevers, or severe pain  -------------------------------------------------------------------------------------  71 year old male with favorable intermediate risk prostate cancer and treatment plan of external beam radiation and 6 months total of ADT.  A 36-month injection of Eligard was also given today(23-month total duration).  We discussed the risks and benefits of ADT, and the importance of calcium and vitamin D supplementation, as well as side effects to be aware of including hot flashes, weight gain, decreased energy/fatigue, night sweats.  I spent 25 total minutes on the day of the encounter including pre-visit review of the medical record, face-to-face time with the patient regarding risks and benefits of ADT and treatment plan regarding prostate cancer, and post visit ordering of labs/imaging/tests.   Xavier Madrid, MD 01/13/2021

## 2021-01-13 NOTE — Patient Instructions (Addendum)
Take Vitamin D 800-1000iu and Calcium 1000-1200mg  daily while on Androgen Deprivation Therapy.   Hormone Suppression Therapy for Prostate Cancer Hormone suppression therapy is a treatment for prostate cancer that can help slow the growth of cancer cells in the prostate gland. It is also called androgen deprivation therapy (ADT) or androgen suppression therapy. Hormone suppression therapy targets male sex hormones (androgens) in the body that help cancer cells grow. Hormone suppression therapy alone will not cure prostate cancer, but it can slow the growth of cancer cells and may shrink tumors over time. Your health care provider can help you find the best treatment that fits your lifestyle. Hormone suppression therapy may be used in the following cases: When prostate cancer has spread too far to other places in the body and cannot be cured by surgery or radiation. When a person has health problems that prevent the use of surgery or radiation. Before radiation to help shrink the size of the cancer and make the radiation treatment more effective. If the prostate cancer remains or comes back following treatment with surgery or radiation. What are the types of hormone suppression therapy? Orchiectomy Orchiectomy, also called surgical castration, is a surgery to remove one or both testicles. The testicles make the two main androgens--testosterone and dihydrotestosterone (DHT). This surgery reduces the levels of testosterone in the blood, leading to decreased androgen production. Medicine therapy Medicine therapy, also called medical or chemical castration, involves taking medicines to keep your body from making or using androgens. Medicines can do this in one of three ways: Reducing androgen production by the testicles. Luteinizing hormone-releasing hormone [facility]) agonists. These medicines are injected or implanted under your skin to lower the amount of androgens that your testicles make. Depending on the  medicine, they can be given monthly or up to every 3 to 6 months. If you take these medicines, you may also be prescribed other medicines to help with side effects. LHRH antagonists. These medicines also work to lower the amount of androgens made in the testicles, but they work faster than [facility] agonist medicines and have less severe side effects. They are given as a monthly injection under the skin, and they are used when prostate cancer is in an advanced stage. Estrogens. These medicines are male hormones that help to reduce androgen production by the testicles. Estrogens are not used as commonly as other types of hormone suppression therapy due to their side effects. However, they may be used if other treatments do not work. Blocking androgen attachment throughout the body. Anti-androgen medicines, also called androgen receptor antagonists, block areas on the body where androgens attach. These are pills that are usually used in combination with other types of hormone suppression therapy, like orchiectomy and other medicines. Blocking androgen production throughout the body. Androgen synthesis inhibitor medicines. These medicines help to stop other areas of the body from making androgens. They are taken as pills. They may be used if the prostate cancer is advanced and has not gotten better with surgery or other medicines. A steroid medicine may be given with this type of medicine to help with side effects. What are the risks? Hormone suppression therapy may cause side effects, including: Hot flashes. Diarrhea and nausea. Itching. Sexual side effects, such as: Decrease or lack of sexual desire. Decrease in size of the penis or testicles. Inability to get an erection (erectile dysfunction, or impotence). Breast tenderness or increase in breast size. Fatigue. Weight gain. Anemia. Thinning of the bones (osteoporosis) and loss of muscle mass. Depression,  mood swings, and trouble with thinking or  focusing. Hormone suppression therapy may also increase your risk of high blood pressure, increased cholesterol levels, stroke, heart attack, or diabetes. What are the benefits? One of the main benefits of hormone suppression therapy is having additional treatment options. You may have only one type of treatment, or two or more types at the same time. Treatments may be combined to: Help with side effects. Treat advanced cancer. Where to find more information American Cancer Society: www.cancer.[place]: www.cancer.gov Contact a health care provider if: You have pain or side effects that do not get better with treatment. You have trouble urinating. You have new side effects that do not go away. Get help right away if: You have severe chest pain. You have trouble breathing. You have an irregular heartbeat. You have numbness or paralysis in the lower half of your body. You are confused. You have trouble talking or understanding speech. These symptoms may be an emergency. Do not wait to see if the symptoms will go away. Get medical help right away. Call your local emergency services (911 in the [place] Do not drive yourself to the hospital. Summary Hormone suppression therapy is a treatment for prostate cancer that can help to slow the growth of cancer cells in the prostate gland. Hormone suppression therapy alone will not cure prostate cancer, but it can slow the growth of prostate cancer and may shrink tumors over time. Treatment to suppress hormones may include surgery or medicines. Side effects such as hot flashes, changes in sexual function or desire, and weakened bones can result from hormone suppression therapy. This information is not intended to replace advice given to you by your health care provider. Make sure you discuss any questions you have with your health care provider. Document Revised: 04/09/2020 Document Reviewed: 04/09/2020 Elsevier Patient Education   [place].

## 2021-01-18 ENCOUNTER — Ambulatory Visit
Admission: RE | Admit: 2021-01-18 | Discharge: 2021-01-18 | Disposition: A | Discharge status: Home / Self Care | Payer: Medicare HMO | Source: Ambulatory Visit | Attending: Radiation Oncology | Admitting: Radiation Oncology

## 2021-01-18 DIAGNOSIS — C61 Malignant neoplasm of prostate: Secondary | ICD-10-CM | POA: Diagnosis not present

## 2021-01-18 DIAGNOSIS — Z510 Encounter for antineoplastic radiation therapy: Secondary | ICD-10-CM | POA: Diagnosis not present

## 2021-01-20 ENCOUNTER — Ambulatory Visit (INDEPENDENT_AMBULATORY_CARE_PROVIDER_SITE_OTHER): Payer: Medicare HMO | Admitting: Family Medicine

## 2021-01-20 ENCOUNTER — Encounter: Payer: Self-pay | Admitting: Family Medicine

## 2021-01-20 VITALS — BP 154/86 | HR 86 | Temp 98.0°F | Resp 16 | Ht 63.0 in | Wt 155.0 lb

## 2021-01-20 DIAGNOSIS — I739 Peripheral vascular disease, unspecified: Secondary | ICD-10-CM

## 2021-01-20 DIAGNOSIS — F341 Dysthymic disorder: Secondary | ICD-10-CM

## 2021-01-20 DIAGNOSIS — R7303 Prediabetes: Secondary | ICD-10-CM

## 2021-01-20 DIAGNOSIS — I69354 Hemiplegia and hemiparesis following cerebral infarction affecting left non-dominant side: Secondary | ICD-10-CM | POA: Diagnosis not present

## 2021-01-20 DIAGNOSIS — R399 Unspecified symptoms and signs involving the genitourinary system: Secondary | ICD-10-CM | POA: Diagnosis not present

## 2021-01-20 DIAGNOSIS — C61 Malignant neoplasm of prostate: Secondary | ICD-10-CM

## 2021-01-20 DIAGNOSIS — E785 Hyperlipidemia, unspecified: Secondary | ICD-10-CM | POA: Diagnosis not present

## 2021-01-20 DIAGNOSIS — F5102 Adjustment insomnia: Secondary | ICD-10-CM

## 2021-01-20 DIAGNOSIS — R69 Illness, unspecified: Secondary | ICD-10-CM | POA: Diagnosis not present

## 2021-01-20 DIAGNOSIS — I10 Essential (primary) hypertension: Secondary | ICD-10-CM

## 2021-01-20 MED ORDER — TEMAZEPAM 15 MG PO CAPS
15.0000 mg | ORAL_CAPSULE | Freq: Every evening | ORAL | 0 refills | Status: AC | PRN
Start: 2021-01-20 — End: ?

## 2021-01-20 MED ORDER — ATORVASTATIN CALCIUM 40 MG PO TABS: 40.0000 mg | ORAL_TABLET | Freq: Every day | ORAL | 1 refills | Status: AC

## 2021-01-20 MED ORDER — ESCITALOPRAM OXALATE 5 MG PO TABS: 5.0000 mg | ORAL_TABLET | Freq: Every morning | ORAL | 0 refills | Status: AC

## 2021-01-20 MED ORDER — AMLODIPINE BESYLATE-VALSARTAN 5-160 MG PO TABS: 1.0000 | ORAL_TABLET | Freq: Every day | ORAL | 0 refills | Status: DC

## 2021-01-20 NOTE — Progress Notes (Signed)
Name: Xavier [name]   MRN: [id number]    DOB: Jul 23, 1950   Date:01/20/2021       Progress Note  Subjective  Chief Complaint  Follow Up  HPI  History of CVA with left side hemiparesis: it happened in 2013, he still has some left side weakness. He has been taking Atorvastatin daily and denies side effects  He is also taking 81 mg aspirin we will recheck labs on his next visit   HTN: his bp has been high since Fall 2022 he is more stressed with prostate cancer and procedures. It has been high for months now , he has been taking Losartan but is not longer controlling bp .   Anemia of chronic diease: iron studies normal 03/2019 and Hbg improved on last labs   Prostate Cancer: diagnosed October 22, currently under the care of Dr. Diamantina Providence, Donella Stade and Janese Banks. He has started Androgen Deprivation therapy on 01/13/21 - Eliarg SuQ. He will start radiation therapy on January 17 th 2023 . He currently does not have a phone and is difficulty to get a hold of him   Peripheral vascular disease: he can walk about one mile before having to stop because left lower leg aches, it improves with rest, he is on statin therapy but last LDL not at goal, we will recheck labs   Dysthymia: he states lives out of town, 14 miles and has difficulty with transportation, he is now diagnosed with possible prostate cancer. Feeling overwhelmed and not sleeping well . Discussed medication for depression and he is willing to try it   Patient Active Problem List   Diagnosis Date Noted   Goals of care, counseling/discussion 12/08/2020   Prostate cancer ([place]) 12/08/2020   Mild anemia 05/10/2019   Hemiparesis affecting left side as late effect of cerebrovascular accident ([place]) 04/16/2019   Prediabetes 03/29/2019   Increased prostate specific antigen (PSA) velocity 03/29/2019   Lower urinary tract symptoms (LUTS) 02/22/2019   Impingement syndrome of shoulder, left 09/28/2017   Hearing loss 19/41/7408   Umbilical hernia  without obstruction and without gangrene 06/21/2016   Medication monitoring encounter 02/23/2016   Hyperglycemia 02/04/2016   Left carotid bruit 02/03/2016   Arthritis of both hands 03/10/2015   Spider varicose veins 03/10/2015   Low serum thyroid stimulating hormone (TSH) 07/22/2014   Hyperlipidemia LDL goal <70 07/21/2014   Hypertension goal BP (blood pressure) < 140/90 07/21/2014   Peripheral arterial disease ([place]) 07/21/2014    Past Surgical History:  Procedure Laterality Date   CYSTOSCOPY WITH RETROGRADE URETHROGRAM N/A 04/17/2019   Procedure: CYSTOSCOPY WITH BALLOON DILATION OF URETHRAL STRICTURE;  Surgeon: Abbie Sons, MD;  Location: [facility];  Service: Urology;  Laterality: N/A;   CYSTOSCOPY WITH URETHRAL DILATATION N/A 04/24/2020   Procedure: CYSTOSCOPY WITH URETHRAL BALLOON DILATATION;  Surgeon: Billey Co, MD;  Location: [facility];  Service: Urology;  Laterality: N/A;   TONSILLECTOMY  1958   TONSILLECTOMY     UMBILICAL HERNIA REPAIR N/A 04/17/2019   Procedure: HERNIA REPAIR UMBILICAL ADULT;  Surgeon: Olean Ree, MD;  Location: [facility];  Service: General;  Laterality: N/A;   URETHROTOMY N/A 04/17/2019   Procedure: DIRECT VISION INTERNAL URETHROTOMY;  Surgeon: Abbie Sons, MD;  Location: [facility];  Service: Urology;  Laterality: N/A;    Family History  Problem Relation Age of Onset   Arthritis Mother    Kidney disease Mother    Heart disease Father    Cancer Father  lung, colon   Diabetes Brother    Cancer Brother        stomach tumor   Glaucoma Maternal Grandmother    Kidney disease Paternal Grandmother    Neuropathy Brother     Social History   Tobacco Use   Smoking status: Never   Smokeless tobacco: Never  Substance Use Topics   Alcohol use: Yes    Alcohol/week: 1.0 standard drink    Types: 1 Cans of beer per week    Comment: 1 beer daily     Current Outpatient Medications:    atorvastatin (LIPITOR) 40 MG tablet, Take 1 tablet (40  mg total) by mouth daily. New cholesterol medication - in place of pravastatin, Disp: 90 tablet, Rfl: 1  No Known Allergies  I personally reviewed active problem list, medication list, allergies, family history, social history, health maintenance with the patient/caregiver today.   ROS  Constitutional: Negative for fever or weight change.  Respiratory: Negative for cough and shortness of breath.   Cardiovascular: Negative for chest pain or palpitations.  Gastrointestinal: Negative for abdominal pain, no bowel changes.  Musculoskeletal: Negative for gait problem or joint swelling.  Skin: Negative for rash.  Neurological: Negative for dizziness or headache.  No other specific complaints in a complete review of systems (except as listed in HPI above).   Objective  Vitals:   01/20/21 1001 01/20/21 1011  BP: (!) 174/88 (!) 152/86  Pulse: 86   Resp: 16   Temp: 98 F (36.7 C)   SpO2: 99%   Weight: 155 lb (70.3 kg)   Height: 5\' 3"  (1.6 m)     Body mass index is 27.46 kg/m.  Physical Exam  Constitutional: Patient appears well-developed and well-nourished. Overweight.  No distress.  HEENT: head atraumatic, normocephalic, pupils equal and reactive to light,  neck supple Cardiovascular: Normal rate, regular rhythm and normal heart sounds.  No murmur heard. No BLE edema. Pulmonary/Chest: Effort normal and breath sounds normal. No respiratory distress. Abdominal: Soft.  There is no tenderness. Psychiatric: Patient has a normal mood and affect. behavior is normal. Judgment and thought content normal.   Recent Results (from the past 2160 hour(s))  PSA     Status: Abnormal   Collection Time: 10/28/20  3:30 PM  Result Value Ref Range   Prostatic Specific Antigen 29.47 (H) 0.00 - 4.00 ng/mL    Comment: (NOTE) While PSA levels of <=4.0 ng/ml are reported as reference range, some men with levels below 4.0 ng/ml can have prostate cancer and many men with PSA above 4.0 ng/ml do not have  prostate cancer.  Other tests such as free PSA, age specific reference ranges, PSA velocity and PSA doubling time may be helpful especially in men less than 2 years old. Performed at [facility], [place] [address], [place], [place] [address]   Comprehensive metabolic panel     Status: Abnormal   Collection Time: 10/28/20  3:30 PM  Result Value Ref Range   Sodium 133 (L) 135 - 145 mmol/L   Potassium 4.4 3.5 - 5.1 mmol/L   Chloride 100 98 - 111 mmol/L   CO2 27 22 - 32 mmol/L   Glucose, Bld 110 (H) 70 - 99 mg/dL    Comment: Glucose reference range applies only to samples taken after fasting for at least 8 hours.   BUN 12 8 - 23 mg/dL   Creatinine, Ser 1.11 0.61 - 1.24 mg/dL   Calcium 8.8 (L) 8.9 - 10.3 mg/dL  Total Protein 7.9 6.5 - 8.1 g/dL   Albumin 4.2 3.5 - 5.0 g/dL   AST 20 15 - 41 U/L   ALT 17 0 - 44 U/L   Alkaline Phosphatase 85 38 - 126 U/L   Total Bilirubin 0.9 0.3 - 1.2 mg/dL   GFR, Estimated >60 >60 mL/min    Comment: (NOTE) Calculated using the CKD-EPI Creatinine Equation (2021)    Anion gap 6 5 - 15    Comment: Performed at [facility], [address] [place], [place] [address]  CBC with Differential     Status: Abnormal   Collection Time: 10/28/20  3:30 PM  Result Value Ref Range   WBC 7.5 4.0 - 10.5 K/uL   RBC 4.12 (L) 4.22 - 5.81 MIL/uL   Hemoglobin 13.0 13.0 - 17.0 g/dL   HCT 38.6 (L) 39.0 - 52.0 %   MCV 93.7 80.0 - 100.0 fL   MCH 31.6 26.0 - 34.0 pg   MCHC 33.7 30.0 - 36.0 g/dL   RDW 11.9 11.5 - 15.5 %   Platelets 320 150 - 400 K/uL   nRBC 0.0 0.0 - 0.2 %   Neutrophils Relative % 70 %   Neutro Abs 5.2 1.7 - 7.7 K/uL   Lymphocytes Relative 20 %   Lymphs Abs 1.5 0.7 - 4.0 K/uL   Monocytes Relative 9 %   Monocytes Absolute 0.7 0.1 - 1.0 K/uL   Eosinophils Relative 1 %   Eosinophils Absolute 0.0 0.0 - 0.5 K/uL   Basophils Relative 0 %   Basophils Absolute 0.0 0.0 - 0.1 K/uL   Immature Granulocytes 0 %   Abs Immature Granulocytes 0.02  0.00 - 0.07 K/uL    Comment: Performed at [facility], [place], [place] [address]  Testosterone     Status: None   Collection Time: 10/28/20  3:30 PM  Result Value Ref Range   Testosterone 356 264 - 916 ng/dL    Comment: (NOTE) Adult male reference interval is based on a population of healthy nonobese males (BMI <30) between 63 and 51 years old. [place], [place] [phone]. PMID: [id number]. Performed At: [facility] [place], [place] [id number] Rush Farmer MD [id number]   Surgical pathology     Status: None   Collection Time: 12/28/20  1:39 PM  Result Value Ref Range   SURGICAL PATHOLOGY      SURGICAL PATHOLOGY CASE: [id number] PATIENT: [name] Surgical Pathology Report     Specimen Submitted: A. PROSTATE, LEFT BASE B. PROSTATE, LEFT MID C. PROSTATE, LEFT APEX D. PROSTATE, RIGHT BASE E. PROSTATE, RIGHT MID F. PROSTATE, RIGHT APEX G. PROSTATE, LEFT LATERAL BASE H. PROSTATE, LEFT LATERAL MID I. PROSTATE, LEFT LATERAL APEX J. PROSTATE, RIGHT LATERAL BASE K. PROSTATE, RIGHT LATERAL MID L. PROSTATE, RIGHT LATERAL APEX  Clinical History: Elevated PSA.  R97.20 - Primary      DIAGNOSIS: Diagnostic Map   Benign  Atypical  Malignant  HGPIN     Diagnostic Summary  [A] PROSTATE, LEFT BASE:   NEGATIVE FOR MALIGNANCY.  [B] PROSTATE, LEFT MID:   NEGATIVE FOR MALIGNANCY.  [C] PROSTATE, LEFT APEX:   NEGATIVE FOR MALIGNANCY.  [D] PROSTATE, RIGHT BASE:   ACINAR ADENOCARCINOMA, GLEASON 3+3=6  (GG 1), INVOLVING 1/1 CORES, MEASURING 11  MM ( 85%).  [E] PROSTATE, RIGHT MID:   ACINAR ADENOCARCINOMA, GLEASON 3+3=6  (GG 1), INVOLVING 1/1 CORES, MEAS URING 14  MM ( 82%).  [F] PROSTATE, RIGHT APEX:  ACINAR ADENOCARCINOMA, GLEASON 3+4=7  (GG 2), INVOLVING 1/1 CORES, MEASURING 15  MM ( 88%). 10% PATTERN 4.  [G] PROSTATE, LEFT LATERAL BASE:   NEGATIVE FOR MALIGNANCY.  [H] PROSTATE, LEFT LATERAL MID:   NEGATIVE  FOR MALIGNANCY.  [I] PROSTATE, LEFT LATERAL APEX:   NEGATIVE FOR MALIGNANCY.  [J] PROSTATE, RIGHT LATERAL BASE:   ACINAR ADENOCARCINOMA, GLEASON 3+3=6  (GG 1), INVOLVING 1/1 CORES, MEASURING 6  MM ( 75%).  [K] PROSTATE, RIGHT LATERAL MID:   ACINAR ADENOCARCINOMA, GLEASON 3+4=7 (GG 2), INVOLVING 1/1 CORES, MEASURING 16  MM ( 89%). 10% PATTERN 4.  [L] PROSTATE, RIGHT LATERAL APEX:   ACINAR ADENOCARCINOMA, GLEASON 3+3=6  (GG 1), INVOLVING 1/1 CORES, MEASURING 4  MM ( 57%).     ROSS DESCRIPTION: A. Labeled: Left base Received: Formalin Collection time: 1:39 PM on 12/28/2020 Placed into formalin time: 1:39 PM on 12/28/2020 Number of needle core biopsy(s): 1 Length: 1.5 cm Diameter: 0.1 cm Description: Needle c ore biopsy fragment of tan soft tissue Ink: Black Entirely submitted in 1 cassette.  B. Labeled: Left mid Received: Formalin Collection time: 1:39 PM on 12/28/2020 Placed into formalin time: 1:39 PM on 12/28/2020 Number of needle core biopsy(s): 2 Length: 0.7 and 1.0 cm Diameter: 0.1 cm Description: Needle core biopsy fragment of tan soft tissue Ink: Black Entirely submitted in 1 cassette.  C. Labeled: Left apex Received: Formalin Collection time: 1:39 PM on 12/28/2020 Placed into formalin time: 1:39 PM on 12/28/2020 Number of needle core biopsy(s): 1 Length: 1.8 cm Diameter: 0.1 cm Description: Needle core biopsy fragment of tan soft tissue Ink: Black Entirely submitted in 1 cassette.  D. Labeled: Right base Received: Formalin Collection time: 1:39 PM on 12/28/2020 Placed into formalin time: 1:39 PM on 12/28/2020 Number of needle core biopsy(s): 1 Length: 1.5 cm Diameter: 0.1 cm Description: Needle core biopsy fragment of tan soft tissue Ink: Black E ntirely submitted in 1 cassette.  E. Labeled: Right mid Received: Formalin Collection time: 1:39 PM on 12/28/2020 Placed into formalin time: 1:39 PM on 12/28/2020 Number of needle core biopsy(s): 1 Length:  1.7 cm Diameter: 0.1 cm Description: Needle core biopsy fragment of tan soft tissue Ink: Black Entirely submitted in 1 cassette.  F. Labeled: Right apex Received: Formalin Collection time: 1:39 PM on 12/28/2020 Placed into formalin time: 1:39 PM on 12/28/2020 Number of needle core biopsy(s): 1 Length: 1.8 cm Diameter: 0.1 cm Description: Needle core biopsy fragment of tan soft tissue Ink: Black Entirely submitted in 1 cassette.  G. Labeled: Left lateral base Received: Formalin Collection time: 1:39 PM on 12/28/2020 Placed into formalin time: 1:39 PM on 12/28/2020 Number of needle core biopsy(s): 1 Length: 1.7 cm Diameter: 0.1 cm Description: Needle core biopsy fragment of tan soft tissue Ink: Black Entirely submitted in 1 cassette.  H. Labeled: Left  lateral mid Received: Formalin Collection time: 1:39 PM on 12/28/2020 Placed into formalin time: 1:39 PM on 12/28/2020 Number of needle core biopsy(s): 1 Length: 1.2 cm Diameter: 0.1 cm Description: Needle core biopsy fragment of tan soft tissue Ink: Black Entirely submitted in 1 cassette.  I. Labeled: Left lateral apex Received: Formalin Collection time: 1:39 PM on 12/28/2020 Placed into formalin time: 1:39 PM on 12/28/2020 Number of needle core biopsy(s): 1 Length: 1.6 cm Diameter: 0.1 cm Description: Needle core biopsy fragment of tan soft tissue Ink: Black Entirely submitted in 1 cassette.  J. Labeled: Right lateral base Received: Formalin Collection time: 1:39 PM on 12/28/2020 Placed into formalin time:  1:39 PM on 12/28/2020 Number of needle core biopsy(s): 1 Length: 0.8 cm Diameter: 0.1 cm Description: Needle core biopsy fragment of tan soft tissue Ink: Black Entirely submitted in 1 cassette.  K. Labeled: Right lateral mid Received: Formalin Co llection time: 1:39 PM on 12/28/2020 Placed into formalin time: 1:39 PM on 12/28/2020 Number of needle core biopsy(s): 1 Length: 1.7 cm Diameter: 0.1  cm Description: Needle core biopsy fragment of tan soft tissue Ink: Black Entirely submitted in 1 cassette.  L. Labeled: Right lateral apex Received: Formalin Collection time: 1:39 PM on 12/28/2020 Placed into formalin time: 1:39 PM on 12/28/2020 Number of needle core biopsy(s): 1 Length: 1.3 cm Diameter: 0.1 cm Description: Needle core biopsy fragment of tan soft tissue Ink: Black Entirely submitted in 1 cassette.  [facility] 12/29/2020  Final Diagnosis performed by Quay Burow, MD.   Electronically signed 12/30/2020 11:23:32AM The electronic signature indicates that the named Attending Pathologist has evaluated the specimen Technical component performed at [facility], [address] [place], [place] [address] Lab: [phone] Dir: Rush Farmer, MD, MMM  Professional component performed at [facility], [facility], [place], [place], [place] [address] Lab: [phone] Dir: Kathi Simpers, MD     PHQ2/9: Depression screen [facility] 2/9 01/20/2021 01/20/2021 10/20/2020 08/27/2020 04/17/2020  Decreased Interest 3 0 0 0 0  Down, Depressed, Hopeless 3 0 0 1 1  PHQ - 2 Score 6 0 0 1 1  Altered sleeping 3 0 - 1 1  Tired, decreased energy 3 0 - 1 1  Change in appetite 0 0 - 0 0  Feeling bad or failure about yourself  1 0 - 0 0  Trouble concentrating 0 0 - 0 0  Moving slowly or fidgety/restless 0 0 - 0 0  Suicidal thoughts 0 0 - 0 0  PHQ-9 Score 13 0 - 3 3  Difficult doing work/chores Very difficult - - Not difficult at all -  Some recent data might be hidden    phq 9 is negative   Fall Risk: Fall Risk  01/20/2021 10/20/2020 08/27/2020 04/17/2020 03/03/2020  Falls in the past year? 0 0 0 0 0  Number falls in past yr: 0 0 0 0 0  Injury with Fall? 0 0 0 0 0  Comment - - - - -  Risk for fall due to : No Fall Risks No Fall Risks No Fall Risks - -  Follow up Falls prevention discussed Falls prevention discussed Falls prevention discussed - Falls evaluation completed       Functional Status Survey: Is the patient deaf or have difficulty hearing?: Yes Does the patient have difficulty seeing, even when wearing glasses/contacts?: No Does the patient have difficulty concentrating, remembering, or making decisions?: No Does the patient have difficulty walking or climbing stairs?: No Does the patient have difficulty dressing or bathing?: No Does the patient have difficulty doing errands alone such as visiting a doctor's office or shopping?: No    Assessment & Plan  1. Hemiparesis affecting left side as late effect of cerebrovascular accident ([place])  - atorvastatin (LIPITOR) 40 MG tablet; Take 1 tablet (40 mg total) by mouth daily. New cholesterol medication - in place of pravastatin  Dispense: 90 tablet; Refill: 1  2. Essential hypertension, benign  - amLODipine-valsartan (EXFORGE) 5-160 MG tablet; Take 1 tablet by mouth daily.  Dispense: 30 tablet; Refill: 0  3. Peripheral arterial disease ([facility])  - atorvastatin (LIPITOR) 40 MG tablet; Take 1 tablet (40 mg total)  by mouth daily. New cholesterol medication - in place of pravastatin  Dispense: 90 tablet; Refill: 1  4. Lower urinary tract symptoms (LUTS)   5. Hyperlipidemia LDL goal <70  Recheck labs next visit   6. Prostate cancer ([place])  Keep follow ups  7. Prediabetes   8. Insomnia due to stress  - temazepam (RESTORIL) 15 MG capsule; Take 1 capsule (15 mg total) by mouth at bedtime as needed for sleep.  Dispense: 30 capsule; Refill: 0  9. Hemiparesis affecting left side as late effect of cerebrovascular accident ([place])  - atorvastatin (LIPITOR) 40 MG tablet; Take 1 tablet (40 mg total) by mouth daily. New cholesterol medication - in place of pravastatin  Dispense: 90 tablet; Refill: 1  10. Peripheral arterial disease ([facility])  - atorvastatin (LIPITOR) 40 MG tablet; Take 1 tablet (40 mg total) by mouth daily. New cholesterol medication - in place of pravastatin  Dispense: 90 tablet; Refill:  1  11. Dysthymia  - escitalopram (LEXAPRO) 5 MG tablet; Take 1 tablet (5 mg total) by mouth in the morning.  Dispense: 30 tablet; Refill: 0

## 2021-01-21 DIAGNOSIS — C61 Malignant neoplasm of prostate: Secondary | ICD-10-CM | POA: Diagnosis not present

## 2021-01-21 DIAGNOSIS — Z510 Encounter for antineoplastic radiation therapy: Secondary | ICD-10-CM | POA: Diagnosis not present

## 2021-01-22 ENCOUNTER — Other Ambulatory Visit: Payer: Self-pay | Admitting: *Deleted

## 2021-01-22 DIAGNOSIS — C61 Malignant neoplasm of prostate: Secondary | ICD-10-CM

## 2021-01-26 ENCOUNTER — Ambulatory Visit: Admission: RE | Admit: 2021-01-26 | Payer: Medicare HMO | Source: Ambulatory Visit

## 2021-01-27 ENCOUNTER — Inpatient Hospital Stay (HOSPITAL_BASED_OUTPATIENT_CLINIC_OR_DEPARTMENT_OTHER): Payer: Medicare HMO | Admitting: Oncology

## 2021-01-27 ENCOUNTER — Inpatient Hospital Stay: Payer: Medicare HMO | Attending: Oncology

## 2021-01-27 ENCOUNTER — Inpatient Hospital Stay: Payer: Medicare HMO

## 2021-01-27 ENCOUNTER — Other Ambulatory Visit: Payer: Self-pay

## 2021-01-27 ENCOUNTER — Encounter: Payer: Self-pay | Admitting: Oncology

## 2021-01-27 ENCOUNTER — Ambulatory Visit
Admission: RE | Admit: 2021-01-27 | Discharge: 2021-01-27 | Disposition: A | Discharge status: Home / Self Care | Payer: Medicare HMO | Source: Ambulatory Visit | Attending: Radiation Oncology | Admitting: Radiation Oncology

## 2021-01-27 VITALS — BP 162/96 | HR 73 | Temp 97.9°F | Resp 16 | Ht 63.0 in | Wt 157.0 lb

## 2021-01-27 DIAGNOSIS — Z510 Encounter for antineoplastic radiation therapy: Secondary | ICD-10-CM | POA: Diagnosis not present

## 2021-01-27 DIAGNOSIS — R972 Elevated prostate specific antigen [PSA]: Secondary | ICD-10-CM

## 2021-01-27 DIAGNOSIS — C61 Malignant neoplasm of prostate: Secondary | ICD-10-CM | POA: Diagnosis not present

## 2021-01-27 LAB — BASIC METABOLIC PANEL
Anion gap: 6 (ref 5–15)
BUN: 20 mg/dL (ref 8–23)
CO2: 27 mmol/L (ref 22–32)
Calcium: 9.1 mg/dL (ref 8.9–10.3)
Chloride: 101 mmol/L (ref 98–111)
Creatinine, Ser: 1.09 mg/dL (ref 0.61–1.24)
GFR, Estimated: >60 mL/min (ref >60)
Glucose, Bld: 93 mg/dL (ref 70–99)
Potassium: 4.5 mmol/L (ref 3.5–5.1)
Sodium: 134 mmol/L — ABNORMAL LOW (ref 135–145)

## 2021-01-27 LAB — CBC WITH DIFFERENTIAL/PLATELET
Abs Immature Granulocytes: 0.01 10*3/uL (ref 0.00–0.07)
Basophils Absolute: 0 10*3/uL (ref 0.0–0.1)
Basophils Relative: 0 %
Eosinophils Absolute: 0.2 10*3/uL (ref 0.0–0.5)
Eosinophils Relative: 2 %
HCT: 38.9 % — ABNORMAL LOW (ref 39.0–52.0)
Hemoglobin: 13.2 g/dL (ref 13.0–17.0)
Immature Granulocytes: 0 %
Lymphocytes Relative: 22 %
Lymphs Abs: 1.6 10*3/uL (ref 0.7–4.0)
MCH: 31.4 pg (ref 26.0–34.0)
MCHC: 33.9 g/dL (ref 30.0–36.0)
MCV: 92.6 fL (ref 80.0–100.0)
Monocytes Absolute: 0.8 10*3/uL (ref 0.1–1.0)
Monocytes Relative: 11 %
Neutro Abs: 4.6 10*3/uL (ref 1.7–7.7)
Neutrophils Relative %: 65 %
Platelets: 279 10*3/uL (ref 150–400)
RBC: 4.2 MIL/uL — ABNORMAL LOW (ref 4.22–5.81)
RDW: 12.7 % (ref 11.5–15.5)
WBC: 7.2 10*3/uL (ref 4.0–10.5)
nRBC: 0 % (ref 0.0–0.2)

## 2021-01-27 LAB — PSA: Prostatic Specific Antigen: 18.44 ng/mL — ABNORMAL HIGH (ref 0.00–4.00)

## 2021-01-27 NOTE — Progress Notes (Signed)
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  He has been following up with urology in the past as well.  His PSA when checked in July 2022 was elevated at 17.4.  Prior to that his PSA was 4, 6 years ago.  Patient is here with his wife today.  States that he is doing well overall and is independent of his ADLs and IADLs.  He tries to workout every day.  Denies any changes in his appetite or weight.  Denies any recent weight loss.  Denies any new aches and pains anywhere.   PSMA PET scan showed intense radiotracer activity in the right lobe of the prostate.  Findings consistent with high-grade prostate adenocarcinoma.  No evidence of capsular extension or seminal vesicle involvement.  No evidence of metastatic pelvic adenopathy.  Lytic lesion in the proximal right femur measuring about 3.7 cm with minimal radiotracer activity and not favored to represent prostate cancer metastases.  Patient had a 12 core prostate biopsy.  6 out of 12 cores showed 3+3 lesions adenocarcinoma.  Patient is currently receiving radiation treatment and also received 1 dose of Lupron with urology in January 2023  Interval history-patient tolerated his Lupron dose well without any significant side effects.  Denies any hip pain.  Reports ongoing mild fatigue.  ECOG PS- 1 Pain scale- 0   Review of systems- Review of Systems   Constitutional:  Positive for malaise/fatigue. Negative for chills, fever and weight loss.  HENT:  Negative for congestion, ear discharge and nosebleeds.   Eyes:  Negative for blurred vision.  Respiratory:  Negative for cough, hemoptysis, sputum production, shortness of breath and wheezing.   Cardiovascular:  Negative for chest pain, palpitations, orthopnea and claudication.  Gastrointestinal:  Negative for abdominal pain, blood in stool, constipation, diarrhea, heartburn, melena, nausea and vomiting.  Genitourinary:  Negative for dysuria, flank pain, frequency, hematuria and urgency.  Musculoskeletal:  Negative for back pain, joint pain and myalgias.  Skin:  Negative for rash.  Neurological:  Negative for dizziness, tingling, focal weakness, seizures, weakness and headaches.  Endo/Heme/Allergies:  Does not bruise/bleed easily.  Psychiatric/Behavioral:  Negative for depression and suicidal ideas. The patient does not have insomnia.       No Known Allergies   Past Medical History:  Diagnosis Date   Family hx of colon cancer requiring screening colonoscopy 06/21/2016   Father at age 71   History of CVA (cerebrovascular accident) 06/22/2011   Overview:  right sided stroke in 2013 (basal ganglia, right insular region, and white matter of the left frontal lobe    Hyperlipidemia    Hypertension    Stroke [facility]) 2013     Past Surgical History:  Procedure Laterality Date   CYSTOSCOPY WITH RETROGRADE URETHROGRAM N/A 04/17/2019   Procedure: CYSTOSCOPY WITH BALLOON DILATION OF URETHRAL STRICTURE;  Surgeon: Abbie Sons, MD;  Location: [facility] ORS;  Service: Urology;  Laterality: N/A;   CYSTOSCOPY WITH URETHRAL DILATATION N/A 04/24/2020  Procedure: CYSTOSCOPY WITH URETHRAL BALLOON DILATATION;  Surgeon: Billey Co, MD;  Location: [facility] ORS;  Service: Urology;  Laterality: N/A;   TONSILLECTOMY  1958   TONSILLECTOMY     UMBILICAL HERNIA REPAIR N/A 04/17/2019   Procedure: HERNIA REPAIR  UMBILICAL ADULT;  Surgeon: Olean Ree, MD;  Location: [facility] ORS;  Service: General;  Laterality: N/A;   URETHROTOMY N/A 04/17/2019   Procedure: DIRECT VISION INTERNAL URETHROTOMY;  Surgeon: Abbie Sons, MD;  Location: [facility] ORS;  Service: Urology;  Laterality: N/A;    Social History   Socioeconomic History   Marital status: Married    Spouse name: Not on file   Number of children: 1   Years of education: Not on file   Highest education level: High school graduate  Occupational History   Not on file  Tobacco Use   Smoking status: Never   Smokeless tobacco: Never  Vaping Use   Vaping Use: Never used  Substance and Sexual Activity   Alcohol use: Yes    Alcohol/week: 1.0 standard drink    Types: 1 Cans of beer per week    Comment: 1 beer daily   Drug use: No   Sexual activity: Yes  Other Topics Concern   Not on file  Social History Narrative   1 son - adopted   Social Determinants of Health   Financial Resource Strain: High Risk   Difficulty of Paying Living Expenses: Hard  Food Insecurity: Food Insecurity Present   Worried About Charity fundraiser in the Last Year: Sometimes true   Arboriculturist in the Last Year: Never true  Transportation Needs: No Transportation Needs   Lack of Transportation (Medical): No   Lack of Transportation (Non-Medical): No  Physical Activity: Sufficiently Active   Days of Exercise per Week: 5 days   Minutes of Exercise per Session: 30 min  Stress: No Stress Concern Present   Feeling of Stress : Only a little  Social Connections: Moderately Integrated   Frequency of Communication with Friends and Family: More than three times a week   Frequency of Social Gatherings with Friends and Family: Once a week   Attends Religious Services: More than 4 times per year   Active Member of [employer] or Organizations: No   Attends Music therapist: Never   Marital Status: Married  Human resources officer Violence: Not At Risk   Fear of Current  or Ex-Partner: No   Emotionally Abused: No   Physically Abused: No   Sexually Abused: No    Family History  Problem Relation Age of Onset   Arthritis Mother    Kidney disease Mother    Heart disease Father    Cancer Father        lung, colon   Diabetes Brother    Cancer Brother        stomach tumor   Glaucoma Maternal Grandmother    Kidney disease Paternal Grandmother    Neuropathy Brother      Current Outpatient Medications:    amLODipine-valsartan (EXFORGE) 5-160 MG tablet, Take 1 tablet by mouth daily., Disp: 30 tablet, Rfl: 0   atorvastatin (LIPITOR) 40 MG tablet, Take 1 tablet (40 mg total) by mouth daily. New cholesterol medication - in place of pravastatin, Disp: 90 tablet, Rfl: 1   escitalopram (LEXAPRO) 5 MG tablet, Take 1 tablet (5 mg total) by mouth in the morning., Disp: 30 tablet, Rfl: 0   temazepam (RESTORIL) 15 MG capsule, Take 1 capsule (  15 mg total) by mouth at bedtime as needed for sleep., Disp: 30 capsule, Rfl: 0  Physical exam:  Vitals:   01/27/21 1325  BP: (!) 162/96  Pulse: 73  Resp: 16  Temp: 97.9 F (36.6 C)  TempSrc: Tympanic  SpO2: 100%  Weight: 157 lb (71.2 kg)  Height: 5\' 3"  (1.6 m)   Physical Exam Cardiovascular:     Rate and Rhythm: Normal rate and regular rhythm.     Heart sounds: Normal heart sounds.  Pulmonary:     Effort: Pulmonary effort is normal.     Breath sounds: Normal breath sounds.  Abdominal:     General: Bowel sounds are normal.     Palpations: Abdomen is soft.  Skin:    General: Skin is warm and dry.  Neurological:     Mental Status: He is alert and oriented to person, place, and time.     CMP Latest Ref Rng & Units 10/28/2020  Glucose 70 - 99 mg/dL 110(H)  BUN 8 - 23 mg/dL 12  Creatinine 0.61 - 1.24 mg/dL 1.11  Sodium 135 - 145 mmol/L 133(L)  Potassium 3.5 - 5.1 mmol/L 4.4  Chloride 98 - 111 mmol/L 100  CO2 22 - 32 mmol/L 27  Calcium 8.9 - 10.3 mg/dL 8.8(L)  Total Protein 6.5 - 8.1 g/dL 7.9  Total  Bilirubin 0.3 - 1.2 mg/dL 0.9  Alkaline Phos 38 - 126 U/L 85  AST 15 - 41 U/L 20  ALT 0 - 44 U/L 17   CBC Latest Ref Rng & Units 10/28/2020  WBC 4.0 - 10.5 K/uL 7.5  Hemoglobin 13.0 - 17.0 g/dL 13.0  Hematocrit 39.0 - 52.0 % 38.6(L)  Platelets 150 - 400 K/uL 320      Assessment and plan- Patient is a 71 y.o. male with locally advancedStage III acT1 cN0 M0 prostatic adenocarcinoma here to discuss further management  No evidence of distant metastatic disease noted on PSMA PET scan.Lytic lesion noted in his right femur that is likely benign in etiology with no significant radiotracer activity noted on PSMA PET.  I will plan to follow-up on this lesion in 6 months  With a baseline PSA of 29 and biopsy-proven prostate adenocarcinoma he has prostate cancer localized to his prostate without involvement of the seminal vesicles or bladder.  He is currently undergoing definitive radiation treatment for the same.  Also received 1 dose of 46-month Lupron in January 2023 through urology.  We will repeat PSA in 3 months in 6 months and I will see him back in 6 months   Visit Diagnosis 1. Prostate cancer ([place])   2. Elevated PSA      Dr. Randa Evens, MD, MPH [facility] [phone] 01/27/2021 1:09 PM

## 2021-01-28 ENCOUNTER — Ambulatory Visit
Admission: RE | Admit: 2021-01-28 | Discharge: 2021-01-28 | Disposition: A | Discharge status: Home / Self Care | Payer: Medicare HMO | Source: Ambulatory Visit | Attending: Radiation Oncology | Admitting: Radiation Oncology

## 2021-01-28 DIAGNOSIS — C61 Malignant neoplasm of prostate: Secondary | ICD-10-CM | POA: Diagnosis not present

## 2021-01-28 DIAGNOSIS — Z510 Encounter for antineoplastic radiation therapy: Secondary | ICD-10-CM | POA: Diagnosis not present

## 2021-01-28 LAB — TESTOSTERONE: Testosterone: 134 ng/dL — ABNORMAL LOW (ref 264–916)

## 2021-01-29 ENCOUNTER — Ambulatory Visit
Admission: RE | Admit: 2021-01-29 | Discharge: 2021-01-29 | Disposition: A | Discharge status: Home / Self Care | Payer: Medicare HMO | Source: Ambulatory Visit | Attending: Radiation Oncology | Admitting: Radiation Oncology

## 2021-01-29 DIAGNOSIS — Z510 Encounter for antineoplastic radiation therapy: Secondary | ICD-10-CM | POA: Diagnosis not present

## 2021-01-29 DIAGNOSIS — C61 Malignant neoplasm of prostate: Secondary | ICD-10-CM | POA: Diagnosis not present

## 2021-02-01 ENCOUNTER — Ambulatory Visit
Admission: RE | Admit: 2021-02-01 | Discharge: 2021-02-01 | Disposition: A | Discharge status: Home / Self Care | Payer: Medicare HMO | Source: Ambulatory Visit | Attending: Radiation Oncology | Admitting: Radiation Oncology

## 2021-02-01 DIAGNOSIS — Z510 Encounter for antineoplastic radiation therapy: Secondary | ICD-10-CM | POA: Diagnosis not present

## 2021-02-01 DIAGNOSIS — C61 Malignant neoplasm of prostate: Secondary | ICD-10-CM | POA: Diagnosis not present

## 2021-02-02 ENCOUNTER — Ambulatory Visit
Admission: RE | Admit: 2021-02-02 | Discharge: 2021-02-02 | Disposition: A | Discharge status: Home / Self Care | Payer: Medicare HMO | Source: Ambulatory Visit | Attending: Radiation Oncology | Admitting: Radiation Oncology

## 2021-02-02 DIAGNOSIS — Z510 Encounter for antineoplastic radiation therapy: Secondary | ICD-10-CM | POA: Diagnosis not present

## 2021-02-02 DIAGNOSIS — C61 Malignant neoplasm of prostate: Secondary | ICD-10-CM | POA: Diagnosis not present

## 2021-02-03 ENCOUNTER — Ambulatory Visit
Admission: RE | Admit: 2021-02-03 | Discharge: 2021-02-03 | Disposition: A | Discharge status: Home / Self Care | Payer: Medicare HMO | Source: Ambulatory Visit | Attending: Radiation Oncology | Admitting: Radiation Oncology

## 2021-02-03 ENCOUNTER — Other Ambulatory Visit: Payer: Self-pay | Admitting: *Deleted

## 2021-02-03 DIAGNOSIS — Z510 Encounter for antineoplastic radiation therapy: Secondary | ICD-10-CM | POA: Diagnosis not present

## 2021-02-03 DIAGNOSIS — R972 Elevated prostate specific antigen [PSA]: Secondary | ICD-10-CM

## 2021-02-03 DIAGNOSIS — C61 Malignant neoplasm of prostate: Secondary | ICD-10-CM

## 2021-02-04 ENCOUNTER — Ambulatory Visit
Admission: RE | Admit: 2021-02-04 | Discharge: 2021-02-04 | Disposition: A | Discharge status: Home / Self Care | Payer: Medicare HMO | Source: Ambulatory Visit | Attending: Radiation Oncology | Admitting: Radiation Oncology

## 2021-02-04 DIAGNOSIS — Z510 Encounter for antineoplastic radiation therapy: Secondary | ICD-10-CM | POA: Diagnosis not present

## 2021-02-04 DIAGNOSIS — C61 Malignant neoplasm of prostate: Secondary | ICD-10-CM | POA: Diagnosis not present

## 2021-02-05 ENCOUNTER — Ambulatory Visit
Admission: RE | Admit: 2021-02-05 | Discharge: 2021-02-05 | Disposition: A | Discharge status: Home / Self Care | Payer: Medicare HMO | Source: Ambulatory Visit | Attending: Radiation Oncology | Admitting: Radiation Oncology

## 2021-02-05 DIAGNOSIS — C61 Malignant neoplasm of prostate: Secondary | ICD-10-CM | POA: Diagnosis not present

## 2021-02-05 DIAGNOSIS — Z510 Encounter for antineoplastic radiation therapy: Secondary | ICD-10-CM | POA: Diagnosis not present

## 2021-02-08 ENCOUNTER — Ambulatory Visit
Admission: RE | Admit: 2021-02-08 | Discharge: 2021-02-08 | Disposition: A | Discharge status: Home / Self Care | Payer: Medicare HMO | Source: Ambulatory Visit | Attending: Radiation Oncology | Admitting: Radiation Oncology

## 2021-02-08 DIAGNOSIS — C61 Malignant neoplasm of prostate: Secondary | ICD-10-CM | POA: Diagnosis not present

## 2021-02-08 DIAGNOSIS — Z510 Encounter for antineoplastic radiation therapy: Secondary | ICD-10-CM | POA: Diagnosis not present

## 2021-02-09 ENCOUNTER — Ambulatory Visit
Admission: RE | Admit: 2021-02-09 | Discharge: 2021-02-09 | Disposition: A | Discharge status: Home / Self Care | Payer: Medicare HMO | Source: Ambulatory Visit | Attending: Radiation Oncology | Admitting: Radiation Oncology

## 2021-02-09 DIAGNOSIS — C61 Malignant neoplasm of prostate: Secondary | ICD-10-CM | POA: Diagnosis not present

## 2021-02-09 DIAGNOSIS — Z510 Encounter for antineoplastic radiation therapy: Secondary | ICD-10-CM | POA: Diagnosis not present

## 2021-02-10 ENCOUNTER — Ambulatory Visit
Admission: RE | Admit: 2021-02-10 | Discharge: 2021-02-10 | Disposition: A | Discharge status: Home / Self Care | Payer: Medicare HMO | Source: Ambulatory Visit | Attending: Radiation Oncology | Admitting: Radiation Oncology

## 2021-02-10 DIAGNOSIS — C61 Malignant neoplasm of prostate: Secondary | ICD-10-CM | POA: Diagnosis not present

## 2021-02-10 DIAGNOSIS — Z510 Encounter for antineoplastic radiation therapy: Secondary | ICD-10-CM | POA: Insufficient documentation

## 2021-02-11 ENCOUNTER — Inpatient Hospital Stay: Payer: Medicare HMO | Attending: Oncology

## 2021-02-11 ENCOUNTER — Ambulatory Visit
Admission: RE | Admit: 2021-02-11 | Discharge: 2021-02-11 | Disposition: A | Discharge status: Home / Self Care | Payer: Medicare HMO | Source: Ambulatory Visit | Attending: Radiation Oncology | Admitting: Radiation Oncology

## 2021-02-11 DIAGNOSIS — Z510 Encounter for antineoplastic radiation therapy: Secondary | ICD-10-CM | POA: Diagnosis not present

## 2021-02-11 DIAGNOSIS — C61 Malignant neoplasm of prostate: Secondary | ICD-10-CM | POA: Diagnosis not present

## 2021-02-12 ENCOUNTER — Ambulatory Visit
Admission: RE | Admit: 2021-02-12 | Discharge: 2021-02-12 | Disposition: A | Discharge status: Home / Self Care | Payer: Medicare HMO | Source: Ambulatory Visit | Attending: Radiation Oncology | Admitting: Radiation Oncology

## 2021-02-12 DIAGNOSIS — C61 Malignant neoplasm of prostate: Secondary | ICD-10-CM | POA: Diagnosis not present

## 2021-02-12 DIAGNOSIS — Z510 Encounter for antineoplastic radiation therapy: Secondary | ICD-10-CM | POA: Diagnosis not present

## 2021-02-15 ENCOUNTER — Ambulatory Visit
Admission: RE | Admit: 2021-02-15 | Discharge: 2021-02-15 | Disposition: A | Discharge status: Home / Self Care | Payer: Medicare HMO | Source: Ambulatory Visit | Attending: Radiation Oncology | Admitting: Radiation Oncology

## 2021-02-15 DIAGNOSIS — C61 Malignant neoplasm of prostate: Secondary | ICD-10-CM | POA: Diagnosis not present

## 2021-02-15 DIAGNOSIS — Z510 Encounter for antineoplastic radiation therapy: Secondary | ICD-10-CM | POA: Diagnosis not present

## 2021-02-16 ENCOUNTER — Ambulatory Visit
Admission: RE | Admit: 2021-02-16 | Discharge: 2021-02-16 | Disposition: A | Discharge status: Home / Self Care | Payer: Medicare HMO | Source: Ambulatory Visit | Attending: Radiation Oncology | Admitting: Radiation Oncology

## 2021-02-16 DIAGNOSIS — C61 Malignant neoplasm of prostate: Secondary | ICD-10-CM | POA: Diagnosis not present

## 2021-02-16 DIAGNOSIS — Z510 Encounter for antineoplastic radiation therapy: Secondary | ICD-10-CM | POA: Diagnosis not present

## 2021-02-17 ENCOUNTER — Ambulatory Visit
Admission: RE | Admit: 2021-02-17 | Discharge: 2021-02-17 | Disposition: A | Discharge status: Home / Self Care | Payer: Medicare HMO | Source: Ambulatory Visit | Attending: Radiation Oncology | Admitting: Radiation Oncology

## 2021-02-17 DIAGNOSIS — Z510 Encounter for antineoplastic radiation therapy: Secondary | ICD-10-CM | POA: Diagnosis not present

## 2021-02-17 DIAGNOSIS — C61 Malignant neoplasm of prostate: Secondary | ICD-10-CM | POA: Diagnosis not present

## 2021-02-18 ENCOUNTER — Ambulatory Visit
Admission: RE | Admit: 2021-02-18 | Discharge: 2021-02-18 | Disposition: A | Discharge status: Home / Self Care | Payer: Medicare HMO | Source: Ambulatory Visit | Attending: Radiation Oncology | Admitting: Radiation Oncology

## 2021-02-18 DIAGNOSIS — Z510 Encounter for antineoplastic radiation therapy: Secondary | ICD-10-CM | POA: Diagnosis not present

## 2021-02-18 DIAGNOSIS — C61 Malignant neoplasm of prostate: Secondary | ICD-10-CM | POA: Diagnosis not present

## 2021-02-19 ENCOUNTER — Ambulatory Visit
Admission: RE | Admit: 2021-02-19 | Discharge: 2021-02-19 | Disposition: A | Discharge status: Home / Self Care | Payer: Medicare HMO | Source: Ambulatory Visit | Attending: Radiation Oncology | Admitting: Radiation Oncology

## 2021-02-19 DIAGNOSIS — C61 Malignant neoplasm of prostate: Secondary | ICD-10-CM | POA: Diagnosis not present

## 2021-02-19 DIAGNOSIS — Z510 Encounter for antineoplastic radiation therapy: Secondary | ICD-10-CM | POA: Diagnosis not present

## 2021-02-22 ENCOUNTER — Ambulatory Visit
Admission: RE | Admit: 2021-02-22 | Discharge: 2021-02-22 | Disposition: A | Discharge status: Home / Self Care | Payer: Medicare HMO | Source: Ambulatory Visit | Attending: Radiation Oncology | Admitting: Radiation Oncology

## 2021-02-22 DIAGNOSIS — C61 Malignant neoplasm of prostate: Secondary | ICD-10-CM | POA: Diagnosis not present

## 2021-02-22 DIAGNOSIS — Z510 Encounter for antineoplastic radiation therapy: Secondary | ICD-10-CM | POA: Diagnosis not present

## 2021-02-22 NOTE — Progress Notes (Unsigned)
Name: Xavier [name]   MRN: [id number]    DOB: 1950-03-09   Date:02/22/2021       Progress Note  Subjective  Chief Complaint  Follow Up  HPI  History of CVA with left side hemiparesis: it happened in 2013, he still has some left side weakness. He has been taking Atorvastatin daily and denies side effects  He is also taking 81 mg aspirin we will recheck labs on his next visit   HTN: his bp has been high since Fall 2022 he is more stressed with prostate cancer and procedures. It has been high for months now , he has been taking Losartan but is not longer controlling bp .   Anemia of chronic diease: iron studies normal 03/2019 and Hbg improved on last labs   Prostate Cancer: diagnosed October 22, currently under the care of Dr. Diamantina Providence, Donella Stade and Janese Banks. He has started Androgen Deprivation therapy on 01/13/21 - Eliarg SuQ. He will start radiation therapy on January 17 th 2023 . He currently does not have a phone and is difficulty to get a hold of him   Peripheral vascular disease: he can walk about one mile before having to stop because left lower leg aches, it improves with rest, he is on statin therapy but last LDL not at goal, we will recheck labs   Dysthymia: he states lives out of town, 14 miles and has difficulty with transportation, he is now diagnosed with possible prostate cancer. Feeling overwhelmed and not sleeping well . Discussed medication for depression and he is willing to try it   Patient Active Problem List   Diagnosis Date Noted   Goals of care, counseling/discussion 12/08/2020   Prostate cancer ([place]) 12/08/2020   Mild anemia 05/10/2019   Hemiparesis affecting left side as late effect of cerebrovascular accident ([place]) 04/16/2019   Prediabetes 03/29/2019   Increased prostate specific antigen (PSA) velocity 03/29/2019   Lower urinary tract symptoms (LUTS) 02/22/2019   Impingement syndrome of shoulder, left 09/28/2017   Hearing loss 43/32/9518   Umbilical hernia  without obstruction and without gangrene 06/21/2016   Medication monitoring encounter 02/23/2016   Hyperglycemia 02/04/2016   Left carotid bruit 02/03/2016   Arthritis of both hands 03/10/2015   Spider varicose veins 03/10/2015   Low serum thyroid stimulating hormone (TSH) 07/22/2014   Hyperlipidemia LDL goal <70 07/21/2014   Hypertension goal BP (blood pressure) < 140/90 07/21/2014   Peripheral arterial disease ([place]) 07/21/2014    Past Surgical History:  Procedure Laterality Date   CYSTOSCOPY WITH RETROGRADE URETHROGRAM N/A 04/17/2019   Procedure: CYSTOSCOPY WITH BALLOON DILATION OF URETHRAL STRICTURE;  Surgeon: Abbie Sons, MD;  Location: [facility] OR[facility];  Service: Urology;  Laterality: N/A;   CYSTOSCOPY WITH URETHRAL DILATATION N/A 04/24/2020   Procedure: CYSTOSCOPY WITH URETHRAL BALLOON DILATATION;  Surgeon: Billey Co, MD;  Location: [facility] OR[facility];  Service: Urology;  Laterality: N/A;   TONSILLECTOMY  1958   TONSILLECTOMY     UMBILICAL HERNIA REPAIR N/A 04/17/2019   Procedure: HERNIA REPAIR UMBILICAL ADULT;  Surgeon: Olean Ree, MD;  Location: [facility] ORS;  Service: General;  Laterality: N/A;   URETHROTOMY N/A 04/17/2019   Procedure: DIRECT VISION INTERNAL URETHROTOMY;  Surgeon: Abbie Sons, MD;  Location: [facility] OR[facility];  Service: Urology;  Laterality: N/A;    Family History  Problem Relation Age of Onset   Arthritis Mother    Kidney disease Mother    Heart disease Father    Cancer Father  lung, colon   Diabetes Brother    Cancer Brother        stomach tumor   Glaucoma Maternal Grandmother    Kidney disease Paternal Grandmother    Neuropathy Brother     Social History   Tobacco Use   Smoking status: Never   Smokeless tobacco: Never  Substance Use Topics   Alcohol use: Yes    Alcohol/week: 1.0 standard drink    Types: 1 Cans of beer per week    Comment: 1 beer daily     Current Outpatient Medications:    amLODipine-valsartan (EXFORGE) 5-160 MG tablet, Take 1  tablet by mouth daily., Disp: 30 tablet, Rfl: 0   atorvastatin (LIPITOR) 40 MG tablet, Take 1 tablet (40 mg total) by mouth daily. New cholesterol medication - in place of pravastatin, Disp: 90 tablet, Rfl: 1   escitalopram (LEXAPRO) 5 MG tablet, Take 1 tablet (5 mg total) by mouth in the morning., Disp: 30 tablet, Rfl: 0   temazepam (RESTORIL) 15 MG capsule, Take 1 capsule (15 mg total) by mouth at bedtime as needed for sleep., Disp: 30 capsule, Rfl: 0  No Known Allergies  I personally reviewed active problem list, medication list, allergies, family history, social history, health maintenance with the patient/caregiver today.   ROS  ***  Objective  There were no vitals filed for this visit.  There is no height or weight on file to calculate BMI.  Physical Exam ***  Recent Results (from the past 2160 hour(s))  Surgical pathology     Status: None   Collection Time: 12/28/20  1:39 PM  Result Value Ref Range   SURGICAL PATHOLOGY      SURGICAL PATHOLOGY CASE: [id number] PATIENT: Xavier [name] Surgical Pathology Report     Specimen Submitted: A. PROSTATE, LEFT BASE B. PROSTATE, LEFT MID C. PROSTATE, LEFT APEX D. PROSTATE, RIGHT BASE E. PROSTATE, RIGHT MID F. PROSTATE, RIGHT APEX G. PROSTATE, LEFT LATERAL BASE H. PROSTATE, LEFT LATERAL MID I. PROSTATE, LEFT LATERAL APEX J. PROSTATE, RIGHT LATERAL BASE K. PROSTATE, RIGHT LATERAL MID L. PROSTATE, RIGHT LATERAL APEX  Clinical History: Elevated PSA.  R97.20 - Primary      DIAGNOSIS: Diagnostic Map   Benign  Atypical  Malignant  HGPIN     Diagnostic Summary  [A] PROSTATE, LEFT BASE:   NEGATIVE FOR MALIGNANCY.  [B] PROSTATE, LEFT MID:   NEGATIVE FOR MALIGNANCY.  [C] PROSTATE, LEFT APEX:   NEGATIVE FOR MALIGNANCY.  [D] PROSTATE, RIGHT BASE:   ACINAR ADENOCARCINOMA, GLEASON 3+3=6  (GG 1), INVOLVING 1/1 CORES, MEASURING 11  MM ( 85%).  [E] PROSTATE, RIGHT MID:   ACINAR ADENOCARCINOMA, GLEASON 3+3=6  (GG 1),  INVOLVING 1/1 CORES, MEAS URING 14  MM ( 82%).  [F] PROSTATE, RIGHT APEX:   ACINAR ADENOCARCINOMA, GLEASON 3+4=7  (GG 2), INVOLVING 1/1 CORES, MEASURING 15  MM ( 88%). 10% PATTERN 4.  [G] PROSTATE, LEFT LATERAL BASE:   NEGATIVE FOR MALIGNANCY.  [H] PROSTATE, LEFT LATERAL MID:   NEGATIVE FOR MALIGNANCY.  [I] PROSTATE, LEFT LATERAL APEX:   NEGATIVE FOR MALIGNANCY.  [J] PROSTATE, RIGHT LATERAL BASE:   ACINAR ADENOCARCINOMA, GLEASON 3+3=6  (GG 1), INVOLVING 1/1 CORES, MEASURING 6  MM ( 75%).  [K] PROSTATE, RIGHT LATERAL MID:   ACINAR ADENOCARCINOMA, GLEASON 3+4=7 (GG 2), INVOLVING 1/1 CORES, MEASURING 16  MM ( 89%). 10% PATTERN 4.  [L] PROSTATE, RIGHT LATERAL APEX:   ACINAR ADENOCARCINOMA, GLEASON 3+3=6  (GG 1), INVOLVING 1/1 CORES, MEASURING 4  MM ( 57%).     ROSS DESCRIPTION: A. Labeled: Left base Received: Formalin Collection time: 1:39 PM on 12/28/2020 Placed into formalin time: 1:39 PM on 12/28/2020 Number of needle core biopsy(s): 1 Length: 1.5 cm Diameter: 0.1 cm Description: Needle c ore biopsy fragment of tan soft tissue Ink: Black Entirely submitted in 1 cassette.  B. Labeled: Left mid Received: Formalin Collection time: 1:39 PM on 12/28/2020 Placed into formalin time: 1:39 PM on 12/28/2020 Number of needle core biopsy(s): 2 Length: 0.7 and 1.0 cm Diameter: 0.1 cm Description: Needle core biopsy fragment of tan soft tissue Ink: Black Entirely submitted in 1 cassette.  C. Labeled: Left apex Received: Formalin Collection time: 1:39 PM on 12/28/2020 Placed into formalin time: 1:39 PM on 12/28/2020 Number of needle core biopsy(s): 1 Length: 1.8 cm Diameter: 0.1 cm Description: Needle core biopsy fragment of tan soft tissue Ink: Black Entirely submitted in 1 cassette.  D. Labeled: Right base Received: Formalin Collection time: 1:39 PM on 12/28/2020 Placed into formalin time: 1:39 PM on 12/28/2020 Number of needle core biopsy(s): 1 Length: 1.5 cm Diameter: 0.1  cm Description: Needle core biopsy fragment of tan soft tissue Ink: Black E ntirely submitted in 1 cassette.  E. Labeled: Right mid Received: Formalin Collection time: 1:39 PM on 12/28/2020 Placed into formalin time: 1:39 PM on 12/28/2020 Number of needle core biopsy(s): 1 Length: 1.7 cm Diameter: 0.1 cm Description: Needle core biopsy fragment of tan soft tissue Ink: Black Entirely submitted in 1 cassette.  F. Labeled: Right apex Received: Formalin Collection time: 1:39 PM on 12/28/2020 Placed into formalin time: 1:39 PM on 12/28/2020 Number of needle core biopsy(s): 1 Length: 1.8 cm Diameter: 0.1 cm Description: Needle core biopsy fragment of tan soft tissue Ink: Black Entirely submitted in 1 cassette.  G. Labeled: Left lateral base Received: Formalin Collection time: 1:39 PM on 12/28/2020 Placed into formalin time: 1:39 PM on 12/28/2020 Number of needle core biopsy(s): 1 Length: 1.7 cm Diameter: 0.1 cm Description: Needle core biopsy fragment of tan soft tissue Ink: Black Entirely submitted in 1 cassette.  H. Labeled: Left  lateral mid Received: Formalin Collection time: 1:39 PM on 12/28/2020 Placed into formalin time: 1:39 PM on 12/28/2020 Number of needle core biopsy(s): 1 Length: 1.2 cm Diameter: 0.1 cm Description: Needle core biopsy fragment of tan soft tissue Ink: Black Entirely submitted in 1 cassette.  I. Labeled: Left lateral apex Received: Formalin Collection time: 1:39 PM on 12/28/2020 Placed into formalin time: 1:39 PM on 12/28/2020 Number of needle core biopsy(s): 1 Length: 1.6 cm Diameter: 0.1 cm Description: Needle core biopsy fragment of tan soft tissue Ink: Black Entirely submitted in 1 cassette.  J. Labeled: Right lateral base Received: Formalin Collection time: 1:39 PM on 12/28/2020 Placed into formalin time: 1:39 PM on 12/28/2020 Number of needle core biopsy(s): 1 Length: 0.8 cm Diameter: 0.1 cm Description: Needle core biopsy  fragment of tan soft tissue Ink: Black Entirely submitted in 1 cassette.  K. Labeled: Right lateral mid Received: Formalin Co llection time: 1:39 PM on 12/28/2020 Placed into formalin time: 1:39 PM on 12/28/2020 Number of needle core biopsy(s): 1 Length: 1.7 cm Diameter: 0.1 cm Description: Needle core biopsy fragment of tan soft tissue Ink: Black Entirely submitted in 1 cassette.  L. Labeled: Right lateral apex Received: Formalin Collection time: 1:39 PM on 12/28/2020 Placed into formalin time: 1:39 PM on 12/28/2020 Number of needle core biopsy(s): 1 Length: 1.3 cm Diameter: 0.1 cm Description: Needle core biopsy fragment of  tan soft tissue Ink: Black Entirely submitted in 1 cassette.  [facility] 12/29/2020  Final Diagnosis performed by Quay Burow, MD.   Electronically signed 12/30/2020 11:23:32AM The electronic signature indicates that the named Attending Pathologist has evaluated the specimen Technical component performed at [facility], [address], [place], [place] [address] Lab: [phone] Dir: Rush Farmer, MD, MMM  Professional component performed at [facility], [facility], [place], [place], [place] [address] Lab: [phone] Dir: Kathi Simpers, MD   Testosterone     Status: Abnormal   Collection Time: 01/27/21  1:10 PM  Result Value Ref Range   Testosterone 134 (L) 264 - 916 ng/dL    Comment: (NOTE) Adult male reference interval is based on a population of healthy nonobese males (BMI <30) between 57 and 4 years old. [place], [place] [phone]. PMID: [id number]. Performed At: [facility] [place], [place] [id number] Rush Farmer MD IE:[id number]   Basic metabolic panel     Status: Abnormal   Collection Time: 01/27/21  1:10 PM  Result Value Ref Range   Sodium 134 (L) 135 - 145 mmol/L   Potassium 4.5 3.5 - 5.1 mmol/L   Chloride 101 98 - 111 mmol/L   CO2 27 22 - 32 mmol/L   Glucose, Bld 93 70 -  99 mg/dL    Comment: Glucose reference range applies only to samples taken after fasting for at least 8 hours.   BUN 20 8 - 23 mg/dL   Creatinine, Ser 1.09 0.61 - 1.24 mg/dL   Calcium 9.1 8.9 - 10.3 mg/dL   GFR, Estimated >60 >60 mL/min    Comment: (NOTE) Calculated using the CKD-EPI Creatinine Equation (2021)    Anion gap 6 5 - 15    Comment: Performed at [facility], [address] [place], [place] [address]  CBC with Differential/Platelet     Status: Abnormal   Collection Time: 01/27/21  1:10 PM  Result Value Ref Range   WBC 7.2 4.0 - 10.5 K/uL   RBC 4.20 (L) 4.22 - 5.81 MIL/uL   Hemoglobin 13.2 13.0 - 17.0 g/dL   HCT 38.9 (L) 39.0 - 52.0 %   MCV 92.6 80.0 - 100.0 fL   MCH 31.4 26.0 - 34.0 pg   MCHC 33.9 30.0 - 36.0 g/dL   RDW 12.7 11.5 - 15.5 %   Platelets 279 150 - 400 K/uL   nRBC 0.0 0.0 - 0.2 %   Neutrophils Relative % 65 %   Neutro Abs 4.6 1.7 - 7.7 K/uL   Lymphocytes Relative 22 %   Lymphs Abs 1.6 0.7 - 4.0 K/uL   Monocytes Relative 11 %   Monocytes Absolute 0.8 0.1 - 1.0 K/uL   Eosinophils Relative 2 %   Eosinophils Absolute 0.2 0.0 - 0.5 K/uL   Basophils Relative 0 %   Basophils Absolute 0.0 0.0 - 0.1 K/uL   Immature Granulocytes 0 %   Abs Immature Granulocytes 0.01 0.00 - 0.07 K/uL    Comment: Performed at [facility], [address] [place], [place] [address]  PSA     Status: Abnormal   Collection Time: 01/27/21  1:11 PM  Result Value Ref Range   Prostatic Specific Antigen 18.44 (H) 0.00 - 4.00 ng/mL    Comment: (NOTE) While PSA levels of <=4.0 ng/ml are reported as reference range, some men with levels below 4.0 ng/ml can have prostate cancer and many men with PSA above 4.0 ng/ml do not have prostate  cancer.  Other tests such as free PSA, age specific reference ranges, PSA velocity and PSA doubling time may be helpful especially in men less than 32 years old. Performed at [facility], [place] [address], [place], [place] [address]       PHQ2/9: Depression screen [facility] 2/9 01/20/2021 01/20/2021 10/20/2020 08/27/2020 04/17/2020  Decreased Interest 3 0 0 0 0  Down, Depressed, Hopeless 3 0 0 1 1  PHQ - 2 Score 6 0 0 1 1  Altered sleeping 3 0 - 1 1  Tired, decreased energy 3 0 - 1 1  Change in appetite 0 0 - 0 0  Feeling bad or failure about yourself  1 0 - 0 0  Trouble concentrating 0 0 - 0 0  Moving slowly or fidgety/restless 0 0 - 0 0  Suicidal thoughts 0 0 - 0 0  PHQ-9 Score 13 0 - 3 3  Difficult doing work/chores Very difficult - - Not difficult at all -  Some recent data might be hidden    phq 9 is {gen pos [id number]}   Fall Risk: Fall Risk  01/20/2021 10/20/2020 08/27/2020 04/17/2020 03/03/2020  Falls in the past year? 0 0 0 0 0  Number falls in past yr: 0 0 0 0 0  Injury with Fall? 0 0 0 0 0  Comment - - - - -  Risk for fall due to : No Fall Risks No Fall Risks No Fall Risks - -  Follow up Falls prevention discussed Falls prevention discussed Falls prevention discussed - Falls evaluation completed      Functional Status Survey:      Assessment & Plan  *** There are no diagnoses linked to this encounter.

## 2021-02-23 ENCOUNTER — Ambulatory Visit: Payer: Medicare HMO | Admitting: Family Medicine

## 2021-02-23 ENCOUNTER — Ambulatory Visit
Admission: RE | Admit: 2021-02-23 | Discharge: 2021-02-23 | Disposition: A | Discharge status: Home / Self Care | Payer: Medicare HMO | Source: Ambulatory Visit | Attending: Radiation Oncology | Admitting: Radiation Oncology

## 2021-02-23 DIAGNOSIS — Z510 Encounter for antineoplastic radiation therapy: Secondary | ICD-10-CM | POA: Diagnosis not present

## 2021-02-23 DIAGNOSIS — C61 Malignant neoplasm of prostate: Secondary | ICD-10-CM | POA: Diagnosis not present

## 2021-02-23 DIAGNOSIS — Z23 Encounter for immunization: Secondary | ICD-10-CM

## 2021-02-24 ENCOUNTER — Ambulatory Visit
Admission: RE | Admit: 2021-02-24 | Discharge: 2021-02-24 | Disposition: A | Discharge status: Home / Self Care | Payer: Medicare HMO | Source: Ambulatory Visit | Attending: Radiation Oncology | Admitting: Radiation Oncology

## 2021-02-24 DIAGNOSIS — Z510 Encounter for antineoplastic radiation therapy: Secondary | ICD-10-CM | POA: Diagnosis not present

## 2021-02-24 DIAGNOSIS — C61 Malignant neoplasm of prostate: Secondary | ICD-10-CM | POA: Diagnosis not present

## 2021-02-25 ENCOUNTER — Ambulatory Visit
Admission: RE | Admit: 2021-02-25 | Discharge: 2021-02-25 | Disposition: A | Discharge status: Home / Self Care | Payer: Medicare HMO | Source: Ambulatory Visit | Attending: Radiation Oncology | Admitting: Radiation Oncology

## 2021-02-25 DIAGNOSIS — C61 Malignant neoplasm of prostate: Secondary | ICD-10-CM | POA: Diagnosis not present

## 2021-02-25 DIAGNOSIS — Z510 Encounter for antineoplastic radiation therapy: Secondary | ICD-10-CM | POA: Diagnosis not present

## 2021-02-26 ENCOUNTER — Ambulatory Visit
Admission: RE | Admit: 2021-02-26 | Discharge: 2021-02-26 | Disposition: A | Discharge status: Home / Self Care | Payer: Medicare HMO | Source: Ambulatory Visit | Attending: Radiation Oncology | Admitting: Radiation Oncology

## 2021-02-26 DIAGNOSIS — C61 Malignant neoplasm of prostate: Secondary | ICD-10-CM | POA: Diagnosis not present

## 2021-02-26 DIAGNOSIS — Z510 Encounter for antineoplastic radiation therapy: Secondary | ICD-10-CM | POA: Diagnosis not present

## 2021-02-27 ENCOUNTER — Telehealth: Payer: Self-pay | Admitting: Family Medicine

## 2021-02-27 DIAGNOSIS — I10 Essential (primary) hypertension: Secondary | ICD-10-CM

## 2021-03-01 ENCOUNTER — Ambulatory Visit
Admission: RE | Admit: 2021-03-01 | Discharge: 2021-03-01 | Disposition: A | Discharge status: Home / Self Care | Payer: Medicare HMO | Source: Ambulatory Visit | Attending: Radiation Oncology | Admitting: Radiation Oncology

## 2021-03-01 DIAGNOSIS — C61 Malignant neoplasm of prostate: Secondary | ICD-10-CM | POA: Diagnosis not present

## 2021-03-01 DIAGNOSIS — Z510 Encounter for antineoplastic radiation therapy: Secondary | ICD-10-CM | POA: Diagnosis not present

## 2021-03-01 NOTE — Telephone Encounter (Signed)
Discontinued 01/20/21. Requested Prescriptions  Pending Prescriptions Disp Refills   losartan (COZAAR) 100 MG tablet [Pharmacy Med Name: losartan 100 mg tablet] 90 tablet 1    Sig: TAKE ONE TABLET BY MOUTH ONCE DAILY FOR blood pressure     Cardiovascular:  Angiotensin Receptor Blockers Failed - 02/27/2021  8:05 AM      Failed - Last BP in normal range    BP Readings from Last 1 Encounters:  01/27/21 (!) 162/96         Passed - Cr in normal range and within 180 days    Creat  Date Value Ref Range Status  04/17/2020 0.98 0.70 - 1.25 mg/dL Final    Comment:    For patients >76 years of age, the reference limit for Creatinine is approximately 13% higher for people identified as African-American. .    Creatinine, Ser  Date Value Ref Range Status  01/27/2021 1.09 0.61 - 1.24 mg/dL Final         Passed - K in normal range and within 180 days    Potassium  Date Value Ref Range Status  01/27/2021 4.5 3.5 - 5.1 mmol/L Final         Passed - Patient is not pregnant      Passed - Valid encounter within last 6 months    Recent Outpatient Visits          1 month ago Hemiparesis affecting left side as late effect of cerebrovascular accident [facility])   [facility] [place], Drue Stager, MD   4 months ago Hemiparesis affecting left side as late effect of cerebrovascular accident [facility])   [facility] [place], Drue Stager, MD   10 months ago Hypertension goal BP (blood pressure) < 140/90   [facility] Steele Sizer, MD   12 months ago Mild anemia   [facility] Delsa Grana, PA-C   1 year ago Acute diffuse otitis externa of right ear   [place], MD      Future Appointments            In 6 months [facility], [facility]

## 2021-03-02 ENCOUNTER — Ambulatory Visit
Admission: RE | Admit: 2021-03-02 | Discharge: 2021-03-02 | Disposition: A | Discharge status: Home / Self Care | Payer: Medicare HMO | Source: Ambulatory Visit | Attending: Radiation Oncology | Admitting: Radiation Oncology

## 2021-03-02 DIAGNOSIS — C61 Malignant neoplasm of prostate: Secondary | ICD-10-CM | POA: Diagnosis not present

## 2021-03-02 DIAGNOSIS — Z510 Encounter for antineoplastic radiation therapy: Secondary | ICD-10-CM | POA: Diagnosis not present

## 2021-03-03 ENCOUNTER — Ambulatory Visit
Admission: RE | Admit: 2021-03-03 | Discharge: 2021-03-03 | Disposition: A | Discharge status: Home / Self Care | Payer: Medicare HMO | Source: Ambulatory Visit | Attending: Radiation Oncology | Admitting: Radiation Oncology

## 2021-03-03 DIAGNOSIS — C61 Malignant neoplasm of prostate: Secondary | ICD-10-CM | POA: Diagnosis not present

## 2021-03-03 DIAGNOSIS — Z510 Encounter for antineoplastic radiation therapy: Secondary | ICD-10-CM | POA: Diagnosis not present

## 2021-03-04 ENCOUNTER — Ambulatory Visit
Admission: RE | Admit: 2021-03-04 | Discharge: 2021-03-04 | Disposition: A | Discharge status: Home / Self Care | Payer: Medicare HMO | Source: Ambulatory Visit | Attending: Radiation Oncology | Admitting: Radiation Oncology

## 2021-03-04 DIAGNOSIS — C61 Malignant neoplasm of prostate: Secondary | ICD-10-CM | POA: Diagnosis not present

## 2021-03-04 DIAGNOSIS — Z510 Encounter for antineoplastic radiation therapy: Secondary | ICD-10-CM | POA: Diagnosis not present

## 2021-03-05 ENCOUNTER — Ambulatory Visit: Payer: Medicare HMO

## 2021-03-05 ENCOUNTER — Ambulatory Visit
Admission: RE | Admit: 2021-03-05 | Discharge: 2021-03-05 | Disposition: A | Discharge status: Home / Self Care | Payer: Medicare HMO | Source: Ambulatory Visit | Attending: Radiation Oncology | Admitting: Radiation Oncology

## 2021-03-05 DIAGNOSIS — C61 Malignant neoplasm of prostate: Secondary | ICD-10-CM | POA: Diagnosis not present

## 2021-03-05 DIAGNOSIS — Z510 Encounter for antineoplastic radiation therapy: Secondary | ICD-10-CM | POA: Diagnosis not present

## 2021-03-08 ENCOUNTER — Ambulatory Visit: Payer: Medicare HMO

## 2021-03-08 ENCOUNTER — Other Ambulatory Visit: Payer: Self-pay | Admitting: Family Medicine

## 2021-03-08 DIAGNOSIS — F341 Dysthymic disorder: Secondary | ICD-10-CM

## 2021-03-08 DIAGNOSIS — F5101 Primary insomnia: Secondary | ICD-10-CM

## 2021-03-08 DIAGNOSIS — F5102 Adjustment insomnia: Secondary | ICD-10-CM

## 2021-03-08 DIAGNOSIS — I10 Essential (primary) hypertension: Secondary | ICD-10-CM

## 2021-03-08 NOTE — Telephone Encounter (Signed)
Called and lvm on cell for patient per Dr. Ancil Boozer to schedule a virtual appointment with her for medication refill.  Also tried calling home number but was unsuccessful.

## 2021-03-09 ENCOUNTER — Ambulatory Visit: Payer: Medicare HMO

## 2021-03-10 ENCOUNTER — Ambulatory Visit: Payer: Medicare HMO

## 2021-03-11 ENCOUNTER — Ambulatory Visit: Payer: Medicare HMO

## 2021-03-12 ENCOUNTER — Ambulatory Visit: Payer: Medicare HMO

## 2021-03-15 ENCOUNTER — Ambulatory Visit: Payer: Medicare HMO

## 2021-03-16 ENCOUNTER — Ambulatory Visit: Payer: Medicare HMO

## 2021-03-17 ENCOUNTER — Ambulatory Visit: Payer: Medicare HMO

## 2021-03-18 ENCOUNTER — Ambulatory Visit: Payer: Medicare HMO

## 2021-03-19 ENCOUNTER — Ambulatory Visit: Payer: Medicare HMO

## 2021-03-22 ENCOUNTER — Ambulatory Visit: Payer: Medicare HMO

## 2021-03-23 ENCOUNTER — Ambulatory Visit: Payer: Medicare HMO

## 2021-04-05 ENCOUNTER — Encounter: Payer: Self-pay | Admitting: Radiation Oncology

## 2021-04-05 ENCOUNTER — Other Ambulatory Visit: Payer: Self-pay

## 2021-04-05 ENCOUNTER — Ambulatory Visit
Admission: RE | Admit: 2021-04-05 | Discharge: 2021-04-05 | Disposition: A | Discharge status: Home / Self Care | Payer: Medicare HMO | Source: Ambulatory Visit | Attending: Radiation Oncology | Admitting: Radiation Oncology

## 2021-04-05 VITALS — Temp 98.5°F | Resp 18 | Ht 64.0 in | Wt 160.6 lb

## 2021-04-05 DIAGNOSIS — Z923 Personal history of irradiation: Secondary | ICD-10-CM | POA: Diagnosis not present

## 2021-04-05 DIAGNOSIS — C61 Malignant neoplasm of prostate: Secondary | ICD-10-CM | POA: Diagnosis not present

## 2021-04-05 NOTE — Progress Notes (Signed)
Radiation Oncology ?Follow up Note ? ?Name: Xavier [name]   ?Date:   04/05/2021 ?MRN:  [id number] ?DOB: Apr 21, 1950  ? ? ?This 71 y.o. male presents to the clinic today for 1 month follow-up status post image guided IMRT radiation therapy to his prostate for Gleason 6 (3+3) adenocarcinoma the prostate.  His initial PSA was 35 months prior ? ?REFERRING PROVIDER: Steele Sizer, MD ? ?HPI: Patient is a 71 year old male now 1 month having completed image guided IMRT radiation therapy for Gleason 6 adenocarcinoma presenting with a PSA 5 months prior in the 30 range.  Seen today in routine follow-up he is doing well.  Specifically denies any increased lower urinary tract symptoms diarrhea or fatigue.. ? ?COMPLICATIONS OF TREATMENT: none ? ?FOLLOW UP COMPLIANCE: keeps appointments  ? ?PHYSICAL EXAM:  ?Temp 98.5 ?F (36.9 ?C)   Resp 18   Ht '5\' 4"'$  (1.626 m)   Wt 160 lb 9.6 oz (72.8 kg)   BMI 27.57 kg/m?  ?Well-developed well-nourished patient in NAD. HEENT reveals PERLA, EOMI, discs not visualized.  Oral cavity is clear. No oral mucosal lesions are identified. Neck is clear without evidence of cervical or supraclavicular adenopathy. Lungs are clear to A&P. Cardiac examination is essentially unremarkable with regular rate and rhythm without murmur rub or thrill. Abdomen is benign with no organomegaly or masses noted. Motor sensory and DTR levels are equal and symmetric in the upper and lower extremities. Cranial nerves II through XII are grossly intact. Proprioception is intact. No peripheral adenopathy or edema is identified. No motor or sensory levels are noted. Crude visual fields are within normal range. ? ?RADIOLOGY RESULTS: No current films to review ? ?PLAN: Present time patient is doing well very low side effect profile from his recent image guided IMRT radiation therapy to his prostate.  I have asked to see him back in 3 months with a PSA at that time.  I have instructed him how we will follow his prostate  cancer progress in the future with serial PSA s values.  Patient comprehends my recommendations well. ? ?I would like to take this opportunity to thank you for allowing me to participate in the care of your patient.. ?  ? Noreene Filbert, MD ? ?

## 2021-04-08 DIAGNOSIS — R339 Retention of urine, unspecified: Secondary | ICD-10-CM | POA: Diagnosis not present

## 2021-04-27 ENCOUNTER — Inpatient Hospital Stay: Payer: Medicare HMO | Attending: Oncology

## 2021-04-27 DIAGNOSIS — C61 Malignant neoplasm of prostate: Secondary | ICD-10-CM | POA: Diagnosis not present

## 2021-04-27 LAB — PSA: Prostatic Specific Antigen: 0.86 ng/mL (ref 0.00–4.00)

## 2021-05-13 ENCOUNTER — Other Ambulatory Visit: Payer: Self-pay | Admitting: Family Medicine

## 2021-05-13 DIAGNOSIS — I10 Essential (primary) hypertension: Secondary | ICD-10-CM

## 2021-05-13 NOTE — Telephone Encounter (Signed)
Requested Prescriptions  ?Pending Prescriptions Disp Refills  ?? amLODipine-valsartan (EXFORGE) 5-160 MG tablet [Pharmacy Med Name: amlodipine 5 mg-valsartan 160 mg tablet] 30 tablet 0  ?  Sig: TAKE ONE TABLET BY MOUTH ONCE DAILY  ?  ? Cardiovascular: CCB + ARB Combos Failed - 05/13/2021  3:54 PM  ?  ?  Failed - Na in normal range and within 180 days  ?  Sodium  ?Date Value Ref Range Status  ?01/27/2021 134 (L) 135 - 145 mmol/L Final  ?07/21/2014 137 134 - 144 mmol/L Final  ?   ?  ?  Failed - Last BP in normal range  ?  BP Readings from Last 1 Encounters:  ?01/27/21 (!) 162/96  ?   ?  ?  Passed - K in normal range and within 180 days  ?  Potassium  ?Date Value Ref Range Status  ?01/27/2021 4.5 3.5 - 5.1 mmol/L Final  ?   ?  ?  Passed - Cr in normal range and within 180 days  ?  Creat  ?Date Value Ref Range Status  ?04/17/2020 0.98 0.70 - 1.25 mg/dL Final  ?  Comment:  ?  For patients >61 years of age, the reference limit ?for Creatinine is approximately 13% higher for people ?identified as African-American. ?. ?  ? ?Creatinine, Ser  ?Date Value Ref Range Status  ?01/27/2021 1.09 0.61 - 1.24 mg/dL Final  ?   ?  ?  Passed - Patient is not pregnant  ?  ?  Passed - Valid encounter within last 6 months  ?  Recent Outpatient Visits   ?      ? 3 months ago Hemiparesis affecting left side as late effect of cerebrovascular accident [facility])  ? [facility] [place], Drue Stager, MD  ? 6 months ago Hemiparesis affecting left side as late effect of cerebrovascular accident [facility])  ? [facility] [place], Drue Stager, MD  ? 1 year ago Hypertension goal BP (blood pressure) < 140/90  ? [facility] Steele Sizer, MD  ? 1 year ago Mild anemia  ? [facility] Delsa Grana, PA-C  ? 1 year ago Acute diffuse otitis externa of right ear  ? [facility] Towanda Malkin, MD  ?  ?  ?Future Appointments   ?        ? In 3 months [place]   ?  ? ?  ?  ?  ? ?

## 2021-06-29 ENCOUNTER — Other Ambulatory Visit: Payer: Self-pay | Admitting: *Deleted

## 2021-06-29 ENCOUNTER — Other Ambulatory Visit: Payer: Self-pay | Admitting: Family Medicine

## 2021-06-29 DIAGNOSIS — I10 Essential (primary) hypertension: Secondary | ICD-10-CM

## 2021-06-30 ENCOUNTER — Other Ambulatory Visit: Payer: Self-pay | Admitting: *Deleted

## 2021-06-30 NOTE — Telephone Encounter (Signed)
Lvm for pt to call the office and schedule an appt  

## 2021-07-05 ENCOUNTER — Inpatient Hospital Stay: Payer: Medicare HMO | Attending: Oncology

## 2021-07-05 DIAGNOSIS — C61 Malignant neoplasm of prostate: Secondary | ICD-10-CM | POA: Diagnosis not present

## 2021-07-05 LAB — PSA: Prostatic Specific Antigen: 0.6 ng/mL (ref 0.00–4.00)

## 2021-07-08 ENCOUNTER — Other Ambulatory Visit: Payer: Self-pay | Admitting: *Deleted

## 2021-07-08 ENCOUNTER — Encounter: Payer: Self-pay | Admitting: Radiation Oncology

## 2021-07-08 ENCOUNTER — Ambulatory Visit
Admission: RE | Admit: 2021-07-08 | Discharge: 2021-07-08 | Disposition: A | Discharge status: Home / Self Care | Payer: Medicare HMO | Source: Ambulatory Visit | Attending: Radiation Oncology | Admitting: Radiation Oncology

## 2021-07-08 VITALS — BP 156/99 | HR 79 | Temp 98.0°F | Resp 18 | Ht 64.0 in | Wt 165.1 lb

## 2021-07-08 DIAGNOSIS — C61 Malignant neoplasm of prostate: Secondary | ICD-10-CM

## 2021-07-08 DIAGNOSIS — Z08 Encounter for follow-up examination after completed treatment for malignant neoplasm: Secondary | ICD-10-CM | POA: Diagnosis not present

## 2021-07-08 NOTE — Progress Notes (Signed)
Radiation Oncology Follow up Note  Name: Xavier [name]   Date:   07/08/2021 MRN:  [id number] DOB: October 19, 1950    This 71 y.o. male presents to the clinic today for 43-monthfollow-up status post IMRT radiation therapy to his prostate for Gleason 6 adenocarcinoma the prostate presenting with a PSA of 35.  REFERRING PROVIDER: SSteele Sizer MD  HPI: Patient is a 71year old male now out 4 months having completed IMRT radiation therapy of the prostate for Gleason's 6 adenocarcinoma the prostate presenting with a PSA of 35.  Seen today in routine follow-up he is doing well patient is always self catheterized continues.  Really having no increased lower urinary tract symptoms diarrhea or fatigue.  Most recent PSA continues a downward trend of 0.6 down from 8.862 months ago  COMPLICATIONS OF TREATMENT: none  FOLLOW UP COMPLIANCE: keeps appointments   PHYSICAL EXAM:  BP (!) 156/99   Pulse 79   Temp 98 F (36.7 C)   Resp 18   Ht '5\' 4"'$  (1.626 m)   Wt 165 lb 1.6 oz (74.9 kg)   BMI 28.34 kg/m  Well-developed well-nourished patient in NAD. HEENT reveals PERLA, EOMI, discs not visualized.  Oral cavity is clear. No oral mucosal lesions are identified. Neck is clear without evidence of cervical or supraclavicular adenopathy. Lungs are clear to A&P. Cardiac examination is essentially unremarkable with regular rate and rhythm without murmur rub or thrill. Abdomen is benign with no organomegaly or masses noted. Motor sensory and DTR levels are equal and symmetric in the upper and lower extremities. Cranial nerves II through XII are grossly intact. Proprioception is intact. No peripheral adenopathy or edema is identified. No motor or sensory levels are noted. Crude visual fields are within normal range.  RADIOLOGY RESULTS: No current films for review  PLAN: The present time patient is doing well.  PSA is trending in the right direction.  Has very low side effect profile.  Of asked to see him back in  6 months with a repeat PSA.  Patient knows to call with any concerns.  I would like to take this opportunity to thank you for allowing me to participate in the care of your patient..Noreene Filbert MD

## 2021-07-09 ENCOUNTER — Encounter: Payer: Self-pay | Admitting: Urology

## 2021-07-09 ENCOUNTER — Other Ambulatory Visit: Payer: Medicare HMO

## 2021-07-14 ENCOUNTER — Ambulatory Visit: Payer: Medicare HMO | Admitting: Urology

## 2021-07-20 DIAGNOSIS — R339 Retention of urine, unspecified: Secondary | ICD-10-CM | POA: Diagnosis not present

## 2021-07-20 DIAGNOSIS — N35919 Unspecified urethral stricture, male, unspecified site: Secondary | ICD-10-CM | POA: Diagnosis not present

## 2021-07-27 ENCOUNTER — Inpatient Hospital Stay: Payer: Medicare HMO

## 2021-07-27 ENCOUNTER — Inpatient Hospital Stay: Payer: Medicare HMO | Admitting: Oncology

## 2021-07-28 ENCOUNTER — Other Ambulatory Visit: Payer: Self-pay | Admitting: Family Medicine

## 2021-07-28 DIAGNOSIS — I10 Essential (primary) hypertension: Secondary | ICD-10-CM

## 2021-07-29 NOTE — Telephone Encounter (Signed)
Pt is scheduled for next week.

## 2021-08-02 NOTE — Progress Notes (Deleted)
Name: Xavier [name]   MRN: [id number]    DOB: 1950-01-27   Date:08/02/2021       Progress Note  Subjective  Chief Complaint  Follow Up  HPI  History of CVA with left side hemiparesis: it happened in 2013, he still has some left side weakness. He has been taking Atorvastatin daily and denies side effects  He is also taking 81 mg aspirin we will recheck labs on his next visit   HTN: his bp has been high since Fall 2022 he is more stressed with prostate cancer and procedures. It has been high for months now , he has been taking Losartan but is not longer controlling bp .   Anemia of chronic diease: iron studies normal 03/2019 and Hbg improved on last labs   Prostate Cancer: diagnosed October 22, currently under the care of Dr. Diamantina Providence, Donella Stade and Janese Banks. He has started Androgen Deprivation therapy on 01/13/21 - Eliarg SuQ. He will start radiation therapy on January 17 th 2023 . He currently does not have a phone and is difficulty to get a hold of him   Peripheral vascular disease: he can walk about one mile before having to stop because left lower leg aches, it improves with rest, he is on statin therapy but last LDL not at goal, we will recheck labs   Dysthymia: he states lives out of town, 14 miles and has difficulty with transportation, he is now diagnosed with possible prostate cancer. Feeling overwhelmed and not sleeping well . Discussed medication for depression and he is willing to try it   Patient Active Problem List   Diagnosis Date Noted   Goals of care, counseling/discussion 12/08/2020   Prostate cancer ([place]) 12/08/2020   Mild anemia 05/10/2019   Hemiparesis affecting left side as late effect of cerebrovascular accident ([place]) 04/16/2019   Prediabetes 03/29/2019   Increased prostate specific antigen (PSA) velocity 03/29/2019   Lower urinary tract symptoms (LUTS) 02/22/2019   Impingement syndrome of shoulder, left 09/28/2017   Hearing loss 45/80/9983   Umbilical hernia  without obstruction and without gangrene 06/21/2016   Medication monitoring encounter 02/23/2016   Hyperglycemia 02/04/2016   Left carotid bruit 02/03/2016   Arthritis of both hands 03/10/2015   Spider varicose veins 03/10/2015   Low serum thyroid stimulating hormone (TSH) 07/22/2014   Hyperlipidemia LDL goal <70 07/21/2014   Hypertension goal BP (blood pressure) < 140/90 07/21/2014   Peripheral arterial disease ([place]) 07/21/2014    Past Surgical History:  Procedure Laterality Date   CYSTOSCOPY WITH RETROGRADE URETHROGRAM N/A 04/17/2019   Procedure: CYSTOSCOPY WITH BALLOON DILATION OF URETHRAL STRICTURE;  Surgeon: Abbie Sons, MD;  Location: [facility] ORS;  Service: Urology;  Laterality: N/A;   CYSTOSCOPY WITH URETHRAL DILATATION N/A 04/24/2020   Procedure: CYSTOSCOPY WITH URETHRAL BALLOON DILATATION;  Surgeon: Billey Co, MD;  Location: [facility] ORS;  Service: Urology;  Laterality: N/A;   TONSILLECTOMY  1958   TONSILLECTOMY     UMBILICAL HERNIA REPAIR N/A 04/17/2019   Procedure: HERNIA REPAIR UMBILICAL ADULT;  Surgeon: Olean Ree, MD;  Location: [facility] ORS;  Service: General;  Laterality: N/A;   URETHROTOMY N/A 04/17/2019   Procedure: DIRECT VISION INTERNAL URETHROTOMY;  Surgeon: Abbie Sons, MD;  Location: [facility] ORS;  Service: Urology;  Laterality: N/A;    Family History  Problem Relation Age of Onset   Arthritis Mother    Kidney disease Mother    Heart disease Father    Cancer Father  lung, colon   Diabetes Brother    Cancer Brother        stomach tumor   Glaucoma Maternal Grandmother    Kidney disease Paternal Grandmother    Neuropathy Brother     Social History   Tobacco Use   Smoking status: Never   Smokeless tobacco: Never  Substance Use Topics   Alcohol use: Yes    Alcohol/week: 1.0 standard drink of alcohol    Types: 1 Cans of beer per week    Comment: 1 beer daily     Current Outpatient Medications:    amLODipine-valsartan (EXFORGE) 5-160 MG  tablet, TAKE ONE TABLET BY MOUTH ONCE DAILY Needs appointment for further refills, Disp: 30 tablet, Rfl: 0   atorvastatin (LIPITOR) 40 MG tablet, Take 1 tablet (40 mg total) by mouth daily. New cholesterol medication - in place of pravastatin, Disp: 90 tablet, Rfl: 1   escitalopram (LEXAPRO) 5 MG tablet, Take 1 tablet (5 mg total) by mouth in the morning. (Patient not taking: Reported on 04/05/2021), Disp: 30 tablet, Rfl: 0   temazepam (RESTORIL) 15 MG capsule, Take 1 capsule (15 mg total) by mouth at bedtime as needed for sleep., Disp: 30 capsule, Rfl: 0  No Known Allergies  I personally reviewed active problem list, medication list, allergies, family history, social history, health maintenance with the patient/caregiver today.   ROS  ***  Objective  There were no vitals filed for this visit.  There is no height or weight on file to calculate BMI.  Physical Exam ***  Recent Results (from the past 2160 hour(s))  PSA     Status: None   Collection Time: 07/05/21  1:58 PM  Result Value Ref Range   Prostatic Specific Antigen 0.60 0.00 - 4.00 ng/mL    Comment: (NOTE) While PSA levels of <=4.0 ng/ml are reported as reference range, some men with levels below 4.0 ng/ml can have prostate cancer and many men with PSA above 4.0 ng/ml do not have prostate cancer.  Other tests such as free PSA, age specific reference ranges, PSA velocity and PSA doubling time may be helpful especially in men less than 45 years old. Performed at [facility], [place] [address], [place], [place] [address]     PHQ2/9:    01/20/2021   10:57 AM 01/20/2021   10:01 AM 10/20/2020   10:07 AM 08/27/2020    2:37 PM 04/17/2020    1:52 PM  Depression screen PHQ 2/9  Decreased Interest 3 0 0 0 0  Down, Depressed, Hopeless 3 0 0 1 1  PHQ - 2 Score 6 0 0 1 1  Altered sleeping 3 0  1 1  Tired, decreased energy 3 0  1 1  Change in appetite 0 0  0 0  Feeling bad or failure about yourself  1 0  0 0  Trouble  concentrating 0 0  0 0  Moving slowly or fidgety/restless 0 0  0 0  Suicidal thoughts 0 0  0 0  PHQ-9 Score 13 0  3 3  Difficult doing work/chores Very difficult   Not difficult at all     phq 9 is {gen pos [id number]}   Fall Risk:    01/20/2021   10:00 AM 10/20/2020   10:07 AM 08/27/2020    2:46 PM 04/17/2020    1:52 PM 03/03/2020    3:13 PM  Fall Risk   Falls in the past year? 0 0 0 0 0  Number  falls in past yr: 0 0 0 0 0  Injury with Fall? 0 0 0 0 0  Risk for fall due to : No Fall Risks No Fall Risks No Fall Risks    Follow up Falls prevention discussed Falls prevention discussed Falls prevention discussed  Falls evaluation completed      Functional Status Survey:      Assessment & Plan  *** There are no diagnoses linked to this encounter.

## 2021-08-03 ENCOUNTER — Inpatient Hospital Stay: Payer: Medicare HMO

## 2021-08-03 ENCOUNTER — Inpatient Hospital Stay: Payer: Medicare HMO | Admitting: Oncology

## 2021-08-03 ENCOUNTER — Encounter: Payer: Self-pay | Admitting: Oncology

## 2021-08-03 ENCOUNTER — Ambulatory Visit: Payer: Medicare HMO | Admitting: Family Medicine

## 2021-08-03 DIAGNOSIS — Z1211 Encounter for screening for malignant neoplasm of colon: Secondary | ICD-10-CM

## 2021-08-04 ENCOUNTER — Encounter: Payer: Self-pay | Admitting: Oncology

## 2021-08-25 ENCOUNTER — Telehealth: Payer: Self-pay | Admitting: Oncology

## 2021-08-25 NOTE — Telephone Encounter (Signed)
Left VM with patient to f/u after he missed his appointment on 7/25. Requested he call back to reschedule.

## 2021-08-26 ENCOUNTER — Telehealth: Payer: Self-pay | Admitting: Oncology

## 2021-08-26 NOTE — Telephone Encounter (Signed)
pt called in to schedule appt, but unsure who to schedule with. Said he was supposed to start blood testing after rad appts.. Not sure of how to direct him, notified him that someone would contact him with further information

## 2021-08-31 ENCOUNTER — Telehealth: Payer: Self-pay

## 2021-08-31 ENCOUNTER — Ambulatory Visit (INDEPENDENT_AMBULATORY_CARE_PROVIDER_SITE_OTHER): Payer: Medicare HMO

## 2021-08-31 DIAGNOSIS — Z0000 Encounter for general adult medical examination without abnormal findings: Secondary | ICD-10-CM

## 2021-08-31 NOTE — Telephone Encounter (Signed)
I connected with  Xavier [name] on 08/31/21 by a audio enabled telemedicine application and verified that I am speaking with the correct person using two identifiers.Patient declined today's wellness visit, stating he did not feel well and will call back to reschedule.

## 2021-09-06 ENCOUNTER — Ambulatory Visit: Payer: Medicare HMO | Admitting: Radiation Oncology

## 2021-09-29 ENCOUNTER — Inpatient Hospital Stay: Payer: Medicare HMO

## 2021-09-29 ENCOUNTER — Inpatient Hospital Stay: Payer: Medicare HMO | Attending: Oncology | Admitting: Oncology

## 2021-09-29 NOTE — Progress Notes (Signed)
Visit cancelled.

## 2021-10-04 ENCOUNTER — Encounter: Payer: Self-pay | Admitting: Oncology

## 2021-10-05 ENCOUNTER — Ambulatory Visit
Admission: RE | Admit: 2021-10-05 | Discharge: 2021-10-05 | Disposition: A | Discharge status: Home / Self Care | Payer: Medicare HMO | Source: Ambulatory Visit | Attending: Internal Medicine | Admitting: Internal Medicine

## 2021-10-05 ENCOUNTER — Other Ambulatory Visit: Payer: Self-pay | Admitting: Internal Medicine

## 2021-10-05 DIAGNOSIS — R109 Unspecified abdominal pain: Secondary | ICD-10-CM

## 2021-10-05 DIAGNOSIS — R319 Hematuria, unspecified: Secondary | ICD-10-CM

## 2021-10-15 ENCOUNTER — Encounter: Payer: Self-pay | Admitting: Oncology

## 2021-10-15 ENCOUNTER — Inpatient Hospital Stay (HOSPITAL_BASED_OUTPATIENT_CLINIC_OR_DEPARTMENT_OTHER): Payer: Medicare HMO | Admitting: Oncology

## 2021-10-15 ENCOUNTER — Inpatient Hospital Stay: Payer: Medicare HMO | Attending: Oncology

## 2021-10-15 VITALS — BP 145/90 | HR 75 | Temp 96.7°F | Ht 64.0 in | Wt 166.0 lb

## 2021-10-15 DIAGNOSIS — Z8673 Personal history of transient ischemic attack (TIA), and cerebral infarction without residual deficits: Secondary | ICD-10-CM | POA: Insufficient documentation

## 2021-10-15 DIAGNOSIS — Z79899 Other long term (current) drug therapy: Secondary | ICD-10-CM | POA: Insufficient documentation

## 2021-10-15 DIAGNOSIS — C61 Malignant neoplasm of prostate: Secondary | ICD-10-CM

## 2021-10-15 LAB — COMPREHENSIVE METABOLIC PANEL
ALT: 21 U/L (ref 0–44)
AST: 24 U/L (ref 15–41)
Albumin: 3.9 g/dL (ref 3.5–5.0)
Alkaline Phosphatase: 99 U/L (ref 38–126)
Anion gap: 4 — ABNORMAL LOW (ref 5–15)
BUN: 14 mg/dL (ref 8–23)
CO2: 28 mmol/L (ref 22–32)
Calcium: 9.1 mg/dL (ref 8.9–10.3)
Chloride: 103 mmol/L (ref 98–111)
Creatinine, Ser: 1.12 mg/dL (ref 0.61–1.24)
GFR, Estimated: >60 mL/min (ref >60)
Glucose, Bld: 119 mg/dL — ABNORMAL HIGH (ref 70–99)
Potassium: 4.1 mmol/L (ref 3.5–5.1)
Sodium: 135 mmol/L (ref 135–145)
Total Bilirubin: 0.6 mg/dL (ref 0.3–1.2)
Total Protein: 7.1 g/dL (ref 6.5–8.1)

## 2021-10-15 LAB — CBC WITH DIFFERENTIAL/PLATELET
Abs Immature Granulocytes: 0.03 10*3/uL (ref 0.00–0.07)
Basophils Absolute: 0 10*3/uL (ref 0.0–0.1)
Basophils Relative: 0 %
Eosinophils Absolute: 0.2 10*3/uL (ref 0.0–0.5)
Eosinophils Relative: 2 %
HCT: 34.4 % — ABNORMAL LOW (ref 39.0–52.0)
Hemoglobin: 11.9 g/dL — ABNORMAL LOW (ref 13.0–17.0)
Immature Granulocytes: 0 %
Lymphocytes Relative: 18 %
Lymphs Abs: 1.3 10*3/uL (ref 0.7–4.0)
MCH: 32.5 pg (ref 26.0–34.0)
MCHC: 34.6 g/dL (ref 30.0–36.0)
MCV: 94 fL (ref 80.0–100.0)
Monocytes Absolute: 0.7 10*3/uL (ref 0.1–1.0)
Monocytes Relative: 10 %
Neutro Abs: 5 10*3/uL (ref 1.7–7.7)
Neutrophils Relative %: 70 %
Platelets: 256 10*3/uL (ref 150–400)
RBC: 3.66 MIL/uL — ABNORMAL LOW (ref 4.22–5.81)
RDW: 11.8 % (ref 11.5–15.5)
WBC: 7.2 10*3/uL (ref 4.0–10.5)
nRBC: 0 % (ref 0.0–0.2)

## 2021-10-15 LAB — PSA: Prostatic Specific Antigen: 0.47 ng/mL (ref 0.00–4.00)

## 2021-10-15 NOTE — Progress Notes (Signed)
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  He has been following up with urology in the past as well.  His PSA when checked in July 2022 was elevated at 17.4.  Prior to that his PSA was 4, 6 years ago.  Patient is here with his wife today.  States that he is doing well overall and is independent of his ADLs and IADLs.  He tries to workout every day.  Denies any changes in his appetite or weight.  Denies any recent weight loss.  Denies any new aches and pains anywhere.   PSMA PET scan showed intense radiotracer activity in the right lobe of the prostate.  Findings consistent with high-grade prostate adenocarcinoma.  No evidence of capsular extension or seminal vesicle involvement.  No evidence of metastatic pelvic adenopathy.  Lytic lesion in the proximal right femur measuring about 3.7 cm with minimal radiotracer activity and not favored to represent prostate cancer metastases.   Patient had a 12 core prostate biopsy.  6 out of 12 cores showed 3+3 lesions adenocarcinoma.  Patient is currently receiving radiation treatment and also received 1 dose of Lupron with urology in January 2023    Interval history-patient reports doing well and denies any specific complaints at this time.  He had an episode of right hip pain about a month ago but states that now it is much better  ECOG PS-  1 Pain scale- 0  Review of systems- Review of Systems  Constitutional:  Positive for malaise/fatigue. Negative for chills, fever and weight loss.  HENT:  Negative for congestion, ear discharge and nosebleeds.   Eyes:  Negative for blurred vision.  Respiratory:  Negative for cough, hemoptysis, sputum production, shortness of breath and wheezing.   Cardiovascular:  Negative for chest pain, palpitations, orthopnea and claudication.  Gastrointestinal:  Negative for abdominal pain, blood in stool, constipation, diarrhea, heartburn, melena, nausea and vomiting.  Genitourinary:  Negative for dysuria, flank pain, frequency, hematuria and urgency.  Musculoskeletal:  Negative for back pain, joint pain and myalgias.  Skin:  Negative for rash.  Neurological:  Negative for dizziness, tingling, focal weakness, seizures, weakness and headaches.  Endo/Heme/Allergies:  Does not bruise/bleed easily.  Psychiatric/Behavioral:  Negative for depression and suicidal ideas. The patient does not have insomnia.       No Known Allergies   Past Medical History:  Diagnosis Date   Family hx of colon cancer requiring screening colonoscopy 06/21/2016   Father at age 26   History of CVA (cerebrovascular accident) 06/22/2011   Overview:  right sided stroke in 2013 (basal ganglia, right insular region, and white matter of the left frontal lobe    Hyperlipidemia    Hypertension    Stroke [facility]) 2013     Past Surgical History:  Procedure Laterality Date   CYSTOSCOPY WITH RETROGRADE URETHROGRAM N/A 04/17/2019   Procedure: CYSTOSCOPY WITH BALLOON DILATION OF URETHRAL STRICTURE;  Surgeon: Abbie Sons, MD;  Location:  [facility] ORS;  Service: Urology;  Laterality: N/A;   CYSTOSCOPY WITH URETHRAL DILATATION N/A 04/24/2020   Procedure: CYSTOSCOPY WITH URETHRAL BALLOON DILATATION;  Surgeon: Billey Co, MD;  Location: [facility] ORS;  Service: Urology;  Laterality: N/A;   TONSILLECTOMY  1958   TONSILLECTOMY     UMBILICAL  HERNIA REPAIR N/A 04/17/2019   Procedure: HERNIA REPAIR UMBILICAL ADULT;  Surgeon: Olean Ree, MD;  Location: [facility] ORS;  Service: General;  Laterality: N/A;   URETHROTOMY N/A 04/17/2019   Procedure: DIRECT VISION INTERNAL URETHROTOMY;  Surgeon: Abbie Sons, MD;  Location: [facility] ORS;  Service: Urology;  Laterality: N/A;    Social History   Socioeconomic History   Marital status: Married    Spouse name: Not on file   Number of children: 1   Years of education: Not on file   Highest education level: High school graduate  Occupational History   Not on file  Tobacco Use   Smoking status: Never   Smokeless tobacco: Never  Vaping Use   Vaping Use: Never used  Substance and Sexual Activity   Alcohol use: Yes    Alcohol/week: 1.0 standard drink of alcohol    Types: 1 Cans of beer per week    Comment: 1 beer daily   Drug use: No   Sexual activity: Yes  Other Topics Concern   Not on file  Social History Narrative   1 son - adopted   Social Determinants of Health   Financial Resource Strain: High Risk (08/27/2020)   Overall Financial Resource Strain (CARDIA)    Difficulty of Paying Living Expenses: Hard  Food Insecurity: Food Insecurity Present (08/27/2020)   Hunger Vital Sign    Worried About Running Out of Food in the Last Year: Sometimes true    Ran Out of Food in the Last Year: Never true  Transportation Needs: No Transportation Needs (08/27/2020)   PRAPARE - Hydrologist (Medical): No    Lack of Transportation (Non-Medical): No  Physical Activity: Sufficiently Active (08/27/2020)   Exercise Vital Sign    Days of Exercise per Week: 5 days    Minutes of Exercise per Session: 30 min  Stress: No Stress Concern Present (08/27/2020)   [place]    Feeling of Stress : Only a little  Social Connections: Moderately Integrated (08/27/2020)   Social Connection and Isolation Panel [NHANES]     Frequency of Communication with Friends and Family: More than three times a week    Frequency of Social Gatherings with Friends and Family: Once a week    Attends Religious Services: More than 4 times per year    Active Member of [employer] or Organizations: No    Attends Archivist Meetings: Never    Marital Status: Married  Human resources officer Violence: Not At Risk (08/27/2020)   Humiliation, Afraid, Rape, and Kick questionnaire    Fear of Current or Ex-Partner: No    Emotionally Abused: No    Physically Abused: No    Sexually Abused: No    Family History  Problem Relation Age of Onset   Arthritis Mother    Kidney disease Mother    Heart disease Father    Cancer Father        lung, colon   Diabetes Brother    Cancer Brother        stomach tumor   Glaucoma Maternal Grandmother    Kidney disease Paternal Grandmother  Neuropathy Brother      Current Outpatient Medications:    amLODipine-valsartan (EXFORGE) 5-160 MG tablet, TAKE ONE TABLET BY MOUTH ONCE DAILY Needs appointment for further refills, Disp: 30 tablet, Rfl: 0   atorvastatin (LIPITOR) 40 MG tablet, Take 1 tablet (40 mg total) by mouth daily. New cholesterol medication - in place of pravastatin, Disp: 90 tablet, Rfl: 1   temazepam (RESTORIL) 15 MG capsule, Take 1 capsule (15 mg total) by mouth at bedtime as needed for sleep., Disp: 30 capsule, Rfl: 0   escitalopram (LEXAPRO) 5 MG tablet, Take 1 tablet (5 mg total) by mouth in the morning. (Patient not taking: Reported on 04/05/2021), Disp: 30 tablet, Rfl: 0  Physical exam:  Vitals:   10/15/21 0938  BP: (!) 145/90  Pulse: 75  Temp: (!) 96.7 F (35.9 C)  TempSrc: Tympanic  Weight: 166 lb (75.3 kg)  Height: '5\' 4"'$  (1.626 m)   Physical Exam Constitutional:      General: He is not in acute distress. Cardiovascular:     Rate and Rhythm: Normal rate and regular rhythm.     Heart sounds: Normal heart sounds.  Pulmonary:     Effort: Pulmonary effort is  normal.     Breath sounds: Normal breath sounds.  Abdominal:     General: Bowel sounds are normal.     Palpations: Abdomen is soft.  Skin:    General: Skin is warm and dry.  Neurological:     Mental Status: He is alert and oriented to person, place, and time.         Latest Ref Rng & Units 10/15/2021    9:24 AM  CMP  Glucose 70 - 99 mg/dL 119   BUN 8 - 23 mg/dL 14   Creatinine 0.61 - 1.24 mg/dL 1.12   Sodium 135 - 145 mmol/L 135   Potassium 3.5 - 5.1 mmol/L 4.1   Chloride 98 - 111 mmol/L 103   CO2 22 - 32 mmol/L 28   Calcium 8.9 - 10.3 mg/dL 9.1   Total Protein 6.5 - 8.1 g/dL 7.1   Total Bilirubin 0.3 - 1.2 mg/dL 0.6   Alkaline Phos 38 - 126 U/L 99   AST 15 - 41 U/L 24   ALT 0 - 44 U/L 21       Latest Ref Rng & Units 10/15/2021    9:24 AM  CBC  WBC 4.0 - 10.5 K/uL 7.2   Hemoglobin 13.0 - 17.0 g/dL 11.9   Hematocrit 39.0 - 52.0 % 34.4   Platelets 150 - 400 K/uL 256     No images are attached to the encounter.  DG Abd 1 View  Result Date: 10/06/2021 CLINICAL DATA:  Abdominal pain with voiding.  Hematuria. EXAM: ABDOMEN - 1 VIEW COMPARISON:  11/30/2020. FINDINGS: The bowel gas pattern is normal. A moderate amount of retained stool is present in the colon. Vascular calcifications are noted in the pelvis. Calcifications and metallic markers are noted in the anticipated region of the prostate gland. No radio-opaque calculi or other acute radiographic abnormality are seen. IMPRESSION: 1. No evidence of renal or ureteral calculus. 2. Moderate amount of retained stool in the colon suggesting constipation. Electronically Signed   By: Brett Fairy M.D.   On: 10/06/2021 00:12     Assessment and plan- Patient is a 71 y.o. male with locally advancedStage III acT1 cN0 M0 prostatic adenocarcinoma.  He is here for routine follow-up  Patient does fall into high risk group of  nonmetastatic prostate cancer given that his initial PSA about 11 months ago was 29.47.  After receiving  radiation and 1 dose of Lupron his PSA has now come down to 0.86.  PSA from today is pending.  As long as PSA continues to trend down with no further increase we can watch him without ADT.  If PSA starts to rise again I will have to reintroduce ADT.  Patient had a PSMA PET scan in November 2022 which did not show any evidence of metastatic disease.  He has a known lytic lesion involving his right hip which did not have any radiotracer activity on the PSMA PET scan that would favor prostate cancer metastases and subsequent x-rays have shown that this is a benign appearing lytic lesion.  Labs are also not suggestive of myeloma causing a lytic lesion but I will check myeloma labs in 6 months.  I will repeat his right hip x-ray at this time.  And I will also check an interim PSA in 3 months from now   Visit Diagnosis 1. Prostate cancer [facility])      Dr. Randa Evens, MD, MPH [facility] [phone] 10/15/2021 12:53 PM

## 2021-12-30 ENCOUNTER — Inpatient Hospital Stay: Payer: Medicare HMO | Attending: Oncology

## 2022-01-06 ENCOUNTER — Ambulatory Visit: Payer: Medicare HMO | Attending: Radiation Oncology | Admitting: Radiation Oncology

## 2022-01-13 ENCOUNTER — Other Ambulatory Visit: Payer: Self-pay

## 2022-01-13 DIAGNOSIS — C61 Malignant neoplasm of prostate: Secondary | ICD-10-CM

## 2022-01-17 ENCOUNTER — Inpatient Hospital Stay: Payer: Medicare HMO | Attending: Oncology

## 2022-02-09 ENCOUNTER — Ambulatory Visit
Admission: RE | Admit: 2022-02-09 | Discharge: 2022-02-09 | Disposition: A | Discharge status: Home / Self Care | Payer: Medicare HMO | Source: Ambulatory Visit | Attending: Internal Medicine | Admitting: Internal Medicine

## 2022-02-09 ENCOUNTER — Other Ambulatory Visit: Payer: Self-pay | Admitting: Internal Medicine

## 2022-02-09 ENCOUNTER — Other Ambulatory Visit: Payer: Medicare HMO

## 2022-02-09 DIAGNOSIS — M25512 Pain in left shoulder: Secondary | ICD-10-CM

## 2022-04-14 IMAGING — MR MR PROSTATE WO/W CM
56 series · 56 of 56 positions shown · IV contrast (7ml Gadavist)
Comparison: None.

CLINICAL DATA: Elevated PSA.  Dysuria.

EXAM:
MR PROSTATE WITHOUT AND WITH CONTRAST
TECHNIQUE: Multiplanar multisequence MRI images were obtained of the pelvis
centered about the prostate. Pre and post contrast images were
obtained.
CONTRAST:  7mL GADAVIST GADOBUTROL 1 MMOL/ML IV SOLN

[Series 4: ax in&out whole · axial · 6.0mm · 0.74mm/px · 1 of 35 slices shown (1 of 2)]
[im 1/35]
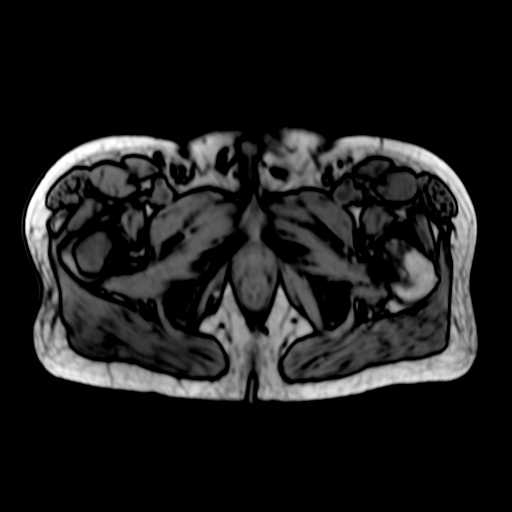

[Series 4: ax in&out whole · axial · 6.0mm · 0.74mm/px · 1 of 35 slices shown (2 of 2)]
[im 1/35]
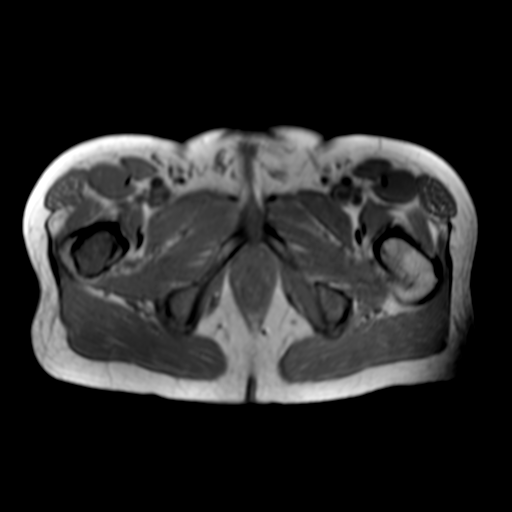

[Series 5: T2 · coronal · 3.0mm · 0.70mm/px · 1 of 35 slices shown (1 of 2)]
[im 1/35]
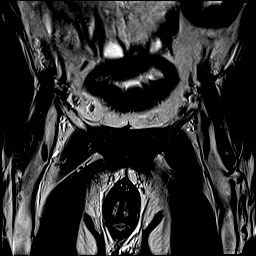

[Series 6: T2 · axial · 3.0mm · 0.56mm/px · 1 of 25 slices shown (2 of 2)]
[im 1/25]
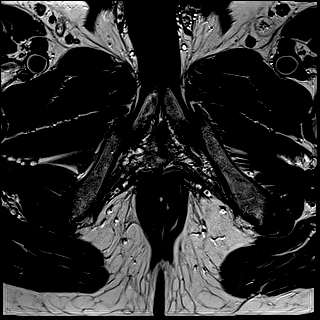

[Series 7: DWI · axial · 3.0mm · 0.86mm/px · 1 of 75 slices shown (1 of 3)]
[im 1/75]
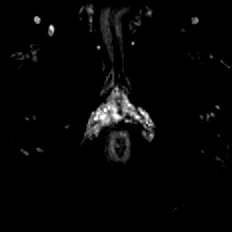

[Series 8: DWI · axial · 3.0mm · 0.86mm/px · 1 of 25 slices shown (2 of 3)]
[im 1/25]
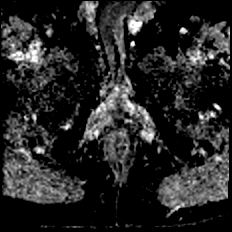

[Series 9: DWI · axial · 3.0mm · 0.86mm/px · 1 of 25 slices shown (3 of 3)]
[im 1/25]
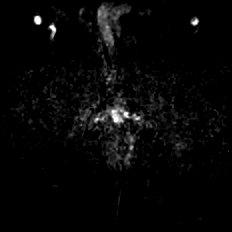

[Series 10: T1 · axial · 3.0mm · 1.15mm/px · 1 of 28 slices shown (1 of 49)]
[im 1/28]
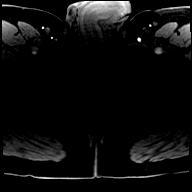

[Series 11: T1 · axial · 3.0mm · 1.15mm/px · 1 of 28 slices shown (2 of 49)]
[im 1/28]
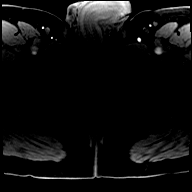

[Series 12: T1 · axial · 3.0mm · 1.15mm/px · 1 of 28 slices shown (3 of 49)]
[im 1/28]
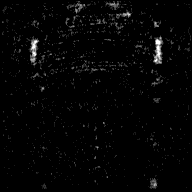

[Series 13: T1 · axial · 3.0mm · 1.15mm/px · 1 of 28 slices shown (4 of 49)]
[im 1/28]
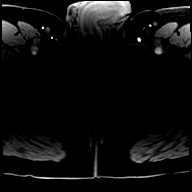

[Series 14: T1 · axial · 3.0mm · 1.15mm/px · 1 of 28 slices shown (5 of 49)]
[im 1/28]
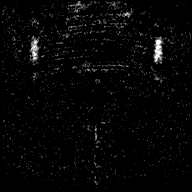

[Series 15: T1 · axial · 3.0mm · 1.15mm/px · 1 of 28 slices shown (6 of 49)]
[im 1/28]
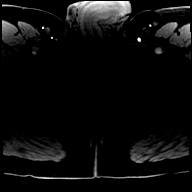

[Series 16: T1 · axial · 3.0mm · 1.15mm/px · 1 of 28 slices shown (7 of 49)]
[im 1/28]
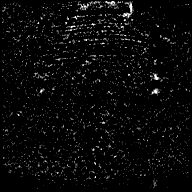

[Series 17: T1 · axial · 3.0mm · 1.15mm/px · 1 of 28 slices shown (8 of 49)]
[im 1/28]
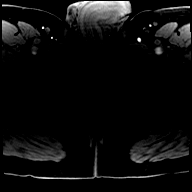

[Series 18: T1 · axial · 3.0mm · 1.15mm/px · 1 of 28 slices shown (9 of 49)]
[im 1/28]
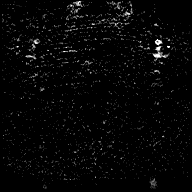

[Series 19: T1 · axial · 3.0mm · 1.15mm/px · 1 of 28 slices shown (10 of 49)]
[im 1/28]
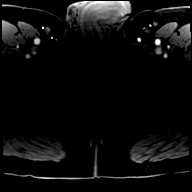

[Series 20: T1 · axial · 3.0mm · 1.15mm/px · 1 of 28 slices shown (11 of 49)]
[im 1/28]
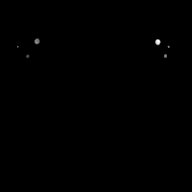

[Series 21: T1 · axial · 3.0mm · 1.15mm/px · 1 of 28 slices shown (12 of 49)]
[im 1/28]
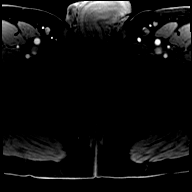

[Series 22: T1 · axial · 3.0mm · 1.15mm/px · 1 of 28 slices shown (13 of 49)]
[im 1/28]
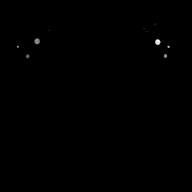

[Series 23: T1 · axial · 3.0mm · 1.15mm/px · 1 of 28 slices shown (14 of 49)]
[im 1/28]
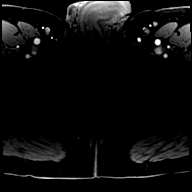

[Series 24: T1 · axial · 3.0mm · 1.15mm/px · 1 of 28 slices shown (15 of 49)]
[im 1/28]
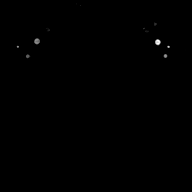

[Series 25: T1 · axial · 3.0mm · 1.15mm/px · 1 of 28 slices shown (16 of 49)]
[im 1/28]
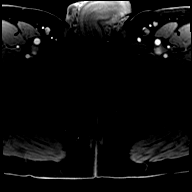

[Series 26: T1 · axial · 3.0mm · 1.15mm/px · 1 of 28 slices shown (17 of 49)]
[im 1/28]
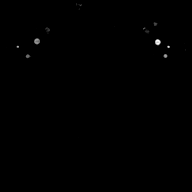

[Series 27: T1 · axial · 3.0mm · 1.15mm/px · 1 of 28 slices shown (18 of 49)]
[im 1/28]
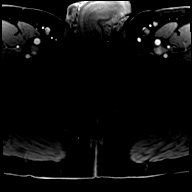

[Series 28: T1 · axial · 3.0mm · 1.15mm/px · 1 of 28 slices shown (19 of 49)]
[im 1/28]
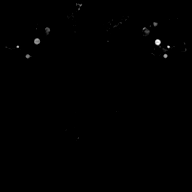

[Series 29: T1 · axial · 3.0mm · 1.15mm/px · 1 of 28 slices shown (20 of 49)]
[im 1/28]
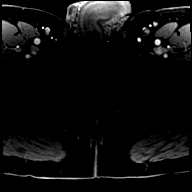

[Series 30: T1 · axial · 3.0mm · 1.15mm/px · 1 of 28 slices shown (21 of 49)]
[im 1/28]
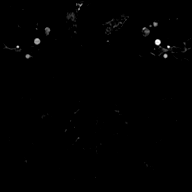

[Series 31: T1 · axial · 3.0mm · 1.15mm/px · 1 of 28 slices shown (22 of 49)]
[im 1/28]
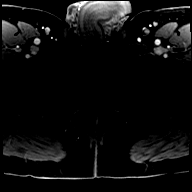

[Series 32: T1 · axial · 3.0mm · 1.15mm/px · 1 of 28 slices shown (23 of 49)]
[im 1/28]
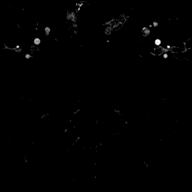

[Series 33: T1 · axial · 3.0mm · 1.15mm/px · 1 of 28 slices shown (24 of 49)]
[im 1/28]
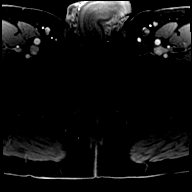

[Series 34: T1 · axial · 3.0mm · 1.15mm/px · 1 of 28 slices shown (25 of 49)]
[im 1/28]
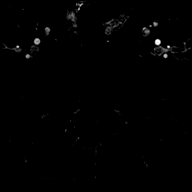

[Series 35: T1 · axial · 3.0mm · 1.15mm/px · 1 of 28 slices shown (26 of 49)]
[im 1/28]
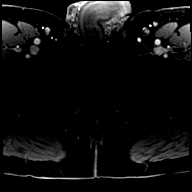

[Series 36: T1 · axial · 3.0mm · 1.15mm/px · 1 of 28 slices shown (27 of 49)]
[im 1/28]
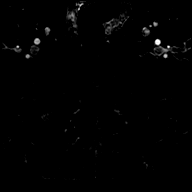

[Series 37: T1 · axial · 3.0mm · 1.15mm/px · 1 of 28 slices shown (28 of 49)]
[im 1/28]
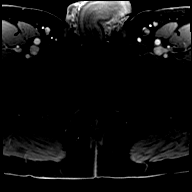

[Series 38: T1 · axial · 3.0mm · 1.15mm/px · 1 of 28 slices shown (29 of 49)]
[im 1/28]
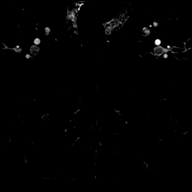

[Series 39: T1 · axial · 3.0mm · 1.15mm/px · 1 of 28 slices shown (30 of 49)]
[im 1/28]
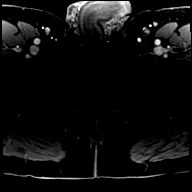

[Series 40: T1 · axial · 3.0mm · 1.15mm/px · 1 of 28 slices shown (31 of 49)]
[im 1/28]
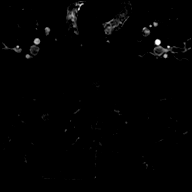

[Series 41: T1 · axial · 3.0mm · 1.15mm/px · 1 of 28 slices shown (32 of 49)]
[im 1/28]
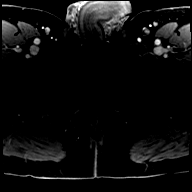

[Series 42: T1 · axial · 3.0mm · 1.15mm/px · 1 of 28 slices shown (33 of 49)]
[im 1/28]
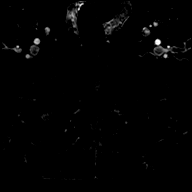

[Series 43: T1 · axial · 3.0mm · 1.15mm/px · 1 of 28 slices shown (34 of 49)]
[im 1/28]
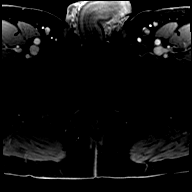

[Series 44: T1 · axial · 3.0mm · 1.15mm/px · 1 of 28 slices shown (35 of 49)]
[im 1/28]
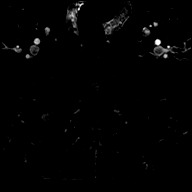

[Series 45: T1 · axial · 3.0mm · 1.15mm/px · 1 of 28 slices shown (36 of 49)]
[im 1/28]
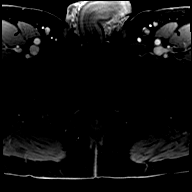

[Series 46: T1 · axial · 3.0mm · 1.15mm/px · 1 of 28 slices shown (37 of 49)]
[im 1/28]
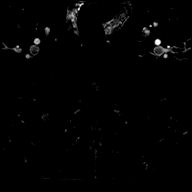

[Series 47: T1 · axial · 3.0mm · 1.15mm/px · 1 of 28 slices shown (38 of 49)]
[im 1/28]
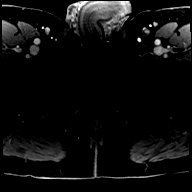

[Series 48: T1 · axial · 3.0mm · 1.15mm/px · 1 of 28 slices shown (39 of 49)]
[im 1/28]
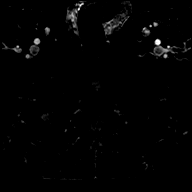

[Series 49: T1 · axial · 3.0mm · 1.15mm/px · 1 of 28 slices shown (40 of 49)]
[im 1/28]
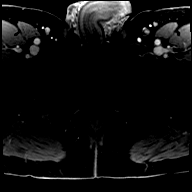

[Series 50: T1 · axial · 3.0mm · 1.15mm/px · 1 of 28 slices shown (41 of 49)]
[im 1/28]
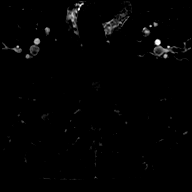

[Series 51: T1 · axial · 3.0mm · 1.15mm/px · 1 of 28 slices shown (42 of 49)]
[im 1/28]
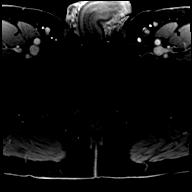

[Series 52: T1 · axial · 3.0mm · 1.15mm/px · 1 of 28 slices shown (43 of 49)]
[im 1/28]
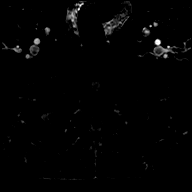

[Series 53: T1 · axial · 3.0mm · 1.15mm/px · 1 of 28 slices shown (44 of 49)]
[im 1/28]
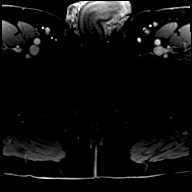

[Series 54: T1 · axial · 3.0mm · 1.15mm/px · 1 of 28 slices shown (45 of 49)]
[im 1/28]
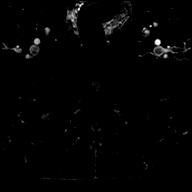

[Series 55: T1 · axial · 3.0mm · 1.15mm/px · 1 of 28 slices shown (46 of 49)]
[im 1/28]
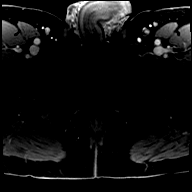

[Series 56: T1 · axial · 3.0mm · 1.15mm/px · 1 of 28 slices shown (47 of 49)]
[im 1/28]
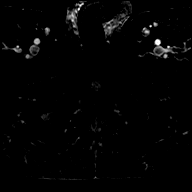

[Series 57: T1 · axial · 3.0mm · 1.15mm/px · 1 of 28 slices shown (48 of 49)]
[im 1/28]
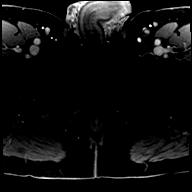

[Series 58: T1 · axial · 3.0mm · 1.15mm/px · 1 of 28 slices shown (49 of 49)]
[im 1/28]
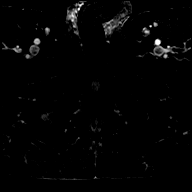

[56 of 56 positions shown; findings below may reference images not displayed]

FINDINGS: Prostate:

-- Peripheral Zone: A T2 hypointense nodule is seen throughout the
right posterior and lateral mid gland and base. This measures 1.9 x
1.4 cm on image [DATE]. This nodule shows marked ADC hypointensity and
DWI hyperintensity, as well as early focal contrast enhancement.

-- Transition/Central Zone:  Normal appearance.

-- Measurements/Volume:  3.6 x 2.6 x 3.5 cm (volume = 17 cm^3)

Transcapsular spread: Focal capsular bulge and irregularity in the
right lateral base is consistent with mild extracapsular extension.

Seminal vesicle involvement: Probable involvement of the right
seminal vesicle noted.

Neurovascular bundle involvement:  Absent

Pelvic adenopathy: 7 mm left internal iliac lymph node is seen on
image [DATE]. Mild external iliac lymphadenopathy is seen bilaterally,
with largest lymph nodes measuring up to 9 mm and showing restricted
diffusion. Numerous smaller sub-cm bilateral inguinal lymph nodes
also show restricted diffusion.

Bone metastasis: 3.4 cm bone lesion in the proximal right femur,
suspicious for bone metastasis.

Other: Marked diffuse bladder wall thickening is seen, suspicious
for chronic bladder outlet obstruction.
IMPRESSION: 1.9 cm peripheral zone nodule in the right posterior and lateral mid
gland and base, highly suspicious for high-grade carcinoma. PI-RADS
5: Very high (clinically significant cancer is highly likely to be
present).

Mild extracapsular extension at the right lateral base, with
probable involvement of the right seminal vesicle.

Shotty sub-cm left internal iliac, bilateral external iliac, and
bilateral inguinal lymphadenopathy, which show restricted diffusion
and are suspicious for metastatic disease.

Bone lesion in the proximal right femur, suspicious for bone
metastasis. Consider Pylarify PET-CT scan for further evaluation.

Findings of chronic bladder outlet obstruction.

(I have post-processed this exam in the DynaCAD application for
potential MR-US fusion-guided biopsy.)

## 2022-04-18 ENCOUNTER — Encounter: Payer: Self-pay | Admitting: Oncology

## 2022-04-18 ENCOUNTER — Inpatient Hospital Stay: Payer: Medicare HMO | Attending: Oncology | Admitting: Oncology

## 2022-04-18 ENCOUNTER — Inpatient Hospital Stay: Payer: Medicare HMO

## 2022-06-27 ENCOUNTER — Telehealth: Payer: Self-pay | Admitting: Urology

## 2022-06-27 NOTE — Telephone Encounter (Signed)
Called [facility] to inquire about why we have not received form to send refills, they have been faxing to the wrong fax number and to the wrong provider's office. Provided them with the correct fax number and provider NPI. Called pt to inform him he may come by the office to pick up samples in the interim, no answer and unable to leave message as no voicemail is set up. 1st attempt.

## 2022-06-27 NOTE — Telephone Encounter (Signed)
Pt called office stating he needs a refill for catheters sent to [facility].  He hasn't been seen since 01/2021.  He wants to know if he can get RX refilled or if he needs to come in for appt.  He has about 2 left.  Can you call pt please?

## 2022-06-28 NOTE — Telephone Encounter (Signed)
Order form received from [facility], form signed and faxed back to [facility].

## 2022-06-29 NOTE — Telephone Encounter (Signed)
Pt walked in ,gave him 89fr coude samples

## 2022-06-29 NOTE — Telephone Encounter (Signed)
Called pt's number, wife answers. Advised wife that catheter order was faxed to [facility] yesterday with confirmation. Advised that the patient can come pick up samples of catheters if need be. Wife voiced understanding.

## 2022-07-12 ENCOUNTER — Encounter: Payer: Self-pay | Admitting: Urology

## 2022-07-12 ENCOUNTER — Other Ambulatory Visit
Admission: RE | Admit: 2022-07-12 | Discharge: 2022-07-12 | Disposition: A | Discharge status: Home / Self Care | Payer: Medicare HMO | Attending: Urology | Admitting: Urology

## 2022-07-12 ENCOUNTER — Ambulatory Visit: Payer: Medicare HMO | Admitting: Urology

## 2022-07-12 VITALS — BP 148/81 | HR 76 | Ht 64.0 in | Wt 165.8 lb

## 2022-07-12 DIAGNOSIS — Z87448 Personal history of other diseases of urinary system: Secondary | ICD-10-CM

## 2022-07-12 DIAGNOSIS — N35812 Other urethral bulbous stricture, male: Secondary | ICD-10-CM

## 2022-07-12 DIAGNOSIS — R972 Elevated prostate specific antigen [PSA]: Secondary | ICD-10-CM

## 2022-07-12 DIAGNOSIS — C61 Malignant neoplasm of prostate: Secondary | ICD-10-CM | POA: Diagnosis not present

## 2022-07-12 DIAGNOSIS — N312 Flaccid neuropathic bladder, not elsewhere classified: Secondary | ICD-10-CM

## 2022-07-12 DIAGNOSIS — R339 Retention of urine, unspecified: Secondary | ICD-10-CM

## 2022-07-12 LAB — PSA: Prostatic Specific Antigen: 1.25 ng/mL (ref 0.00–4.00)

## 2022-07-12 NOTE — Progress Notes (Signed)
   07/12/2022 2:48 PM   Marcial Pacas Rebeca Alert [birth year] [id number]  Reason for visit: Follow up atonic bladder, urethral stricture disease, prostate cancer  HPI: 72 year old male with multiple urologic issues.  He has a long history of atonic bladder as well as urethral stricture disease, previously underwent optilume balloon dilation of a ~50F 1 cm long bulbar urethral stricture in April 2022, continues to require CIC for complete bladder emptying.  He voids minimally between catheterizations, denies any incontinence.  He also was found to have an elevated PSA of 29.5 and underwent a prostate biopsy in December 2022 showing favorable intermediate risk disease, there was initially concern on prostate MRI for metastatic disease, however a follow-up PSMA PET scan showed no evidence of metastatic disease and he was treated for presumed localized prostate cancer with XRT and 62-month ADT injection given January 2023.  He last saw oncology in October 2023 and PSA was low at 0.47, he has not followed up with them yet, despite the recommendation for RTC in 3 months for PSA prior.  Overall, he has been doing well since his last visit.  Reassurance was provided regarding the low PSA, as well as the importance of continuing to trend the PSA, and risk of recurrence.  He continues to catheterize 3 times daily, needs refill on catheters.  Catheters refilled, continue CIC 3 times daily Will draw PSA today, has not had PSA since October 2023, call with results.  If PSA remains low, we can continue to trend PSA with urology, would recommend 27-month PSA  Sondra Come, MD  [facility] [address], [address] [place], [place] [address] [phone]

## 2022-07-13 ENCOUNTER — Telehealth: Payer: Self-pay

## 2022-07-13 DIAGNOSIS — C61 Malignant neoplasm of prostate: Secondary | ICD-10-CM

## 2022-07-13 NOTE — Telephone Encounter (Signed)
Called pt informed him of information below. Lab appt scheduled. PSA ordered. Patient voiced understanding.

## 2022-07-13 NOTE — Telephone Encounter (Signed)
-----   Message from Sondra Come, MD sent at 07/13/2022  7:51 AM EDT ----- PSA low, but has increased from last check.  Please order repeat PSA lab in 3 months, he should also follow-up with his oncologist Dr Ninfa Linden, MD 07/13/2022

## 2022-07-15 ENCOUNTER — Other Ambulatory Visit: Payer: Self-pay | Admitting: *Deleted

## 2022-07-15 DIAGNOSIS — C61 Malignant neoplasm of prostate: Secondary | ICD-10-CM

## 2022-07-19 ENCOUNTER — Telehealth: Payer: Self-pay | Admitting: Urology

## 2022-07-19 NOTE — Telephone Encounter (Signed)
I have put the order in AGAIN.

## 2022-07-19 NOTE — Telephone Encounter (Signed)
Pt LMOM to let us know he has still not received any catheters.

## 2022-07-19 NOTE — Telephone Encounter (Signed)
Did you place a coloplast order online when patient was in office? Thanks

## 2022-07-19 NOTE — Telephone Encounter (Signed)
Pt LMOM he has still NOT received any catheters.

## 2022-07-20 NOTE — Telephone Encounter (Signed)
Please let me know if you got Coloplast verification

## 2022-07-21 ENCOUNTER — Telehealth: Payer: Self-pay | Admitting: Urology

## 2022-07-21 NOTE — Telephone Encounter (Signed)
Patient's wife lmom that he is almost out of catheters, and has not received his order yet.

## 2022-07-22 NOTE — Telephone Encounter (Signed)
Patient states he is call ing [facility] today and than he will call us back

## 2022-07-26 NOTE — Telephone Encounter (Signed)
Spoke with wife and pt received catheters

## 2022-08-05 NOTE — Telephone Encounter (Signed)
Pt called office and said he only got a sample pack of catheters.  He only has about a week's worth left.

## 2022-08-05 NOTE — Telephone Encounter (Signed)
Spoke with [facility] and he was shipped 100 catheters last week 07/26/2022. His full order will ship out today per [facility].  VM full will try to call again later.

## 2022-08-11 ENCOUNTER — Ambulatory Visit: Payer: Medicare HMO | Admitting: Urology

## 2022-08-25 NOTE — Telephone Encounter (Signed)
Mrs Xavier [name] called stating per [facility] they are still waiting on progress notes and notation on there reason for cathing, frequency to be faxed to them. She states this keeps happening every time they need to re order supply. Patient states he uses coude tip 12". Samples left of coude 14" catheters as we do not have 12". Please advise

## 2022-08-26 NOTE — Telephone Encounter (Signed)
Called [facility] they state that they have been waiting on office notes, advised that office notes were sent with fax confirmation on 3 different occasions. Marylene Land was able to find said notes and states she will have the patient's "file updated" and his catheter order will be shipped out today and should arrive to the patient in 3-5 business days.

## 2022-10-13 ENCOUNTER — Other Ambulatory Visit: Payer: Medicare HMO

## 2022-10-13 DIAGNOSIS — C61 Malignant neoplasm of prostate: Secondary | ICD-10-CM

## 2022-10-14 ENCOUNTER — Telehealth: Payer: Self-pay

## 2022-10-14 DIAGNOSIS — C61 Malignant neoplasm of prostate: Secondary | ICD-10-CM

## 2022-10-14 LAB — PSA: Prostate Specific Ag, Serum: 1.4 ng/mL (ref 0.0–4.0)

## 2022-10-14 NOTE — Telephone Encounter (Signed)
Called pt no answer. Unable to leave message as voicemail is not set up. 1st attempt.   Information sent via mychart.

## 2022-10-14 NOTE — Telephone Encounter (Signed)
-----   Message from Sondra Come sent at 10/14/2022  6:52 AM EDT ----- PSA remains low at 1.4.  Recommend repeat lab visit for PSA in 4 months and will call with those results  Legrand Rams, MD 10/14/2022

## 2022-10-18 ENCOUNTER — Inpatient Hospital Stay: Payer: Medicare HMO | Attending: Oncology | Admitting: Oncology

## 2022-10-18 ENCOUNTER — Encounter: Payer: Self-pay | Admitting: Oncology

## 2022-10-24 ENCOUNTER — Other Ambulatory Visit: Payer: Medicare HMO

## 2022-10-28 ENCOUNTER — Ambulatory Visit: Payer: Medicare HMO | Admitting: Oncology

## 2022-12-30 ENCOUNTER — Ambulatory Visit
Admission: RE | Admit: 2022-12-30 | Discharge: 2022-12-30 | Disposition: A | Discharge status: Home / Self Care | Payer: Medicare HMO | Attending: Internal Medicine | Admitting: Internal Medicine

## 2022-12-30 ENCOUNTER — Other Ambulatory Visit: Payer: Self-pay | Admitting: Internal Medicine

## 2022-12-30 ENCOUNTER — Ambulatory Visit
Admission: RE | Admit: 2022-12-30 | Discharge: 2022-12-30 | Disposition: A | Discharge status: Home / Self Care | Payer: Medicare HMO | Source: Ambulatory Visit | Attending: Internal Medicine | Admitting: Internal Medicine

## 2022-12-30 DIAGNOSIS — M25552 Pain in left hip: Secondary | ICD-10-CM | POA: Diagnosis present

## 2022-12-30 DIAGNOSIS — Z79810 Long term (current) use of selective estrogen receptor modulators (SERMs): Secondary | ICD-10-CM

## 2023-04-13 ENCOUNTER — Encounter: Payer: Self-pay | Admitting: Oncology

## 2023-04-13 ENCOUNTER — Other Ambulatory Visit: Payer: Self-pay | Admitting: Internal Medicine

## 2023-04-13 DIAGNOSIS — I83893 Varicose veins of bilateral lower extremities with other complications: Secondary | ICD-10-CM

## 2023-04-14 ENCOUNTER — Ambulatory Visit: Admission: RE | Admit: 2023-04-14 | Source: Ambulatory Visit

## 2023-04-18 ENCOUNTER — Other Ambulatory Visit: Payer: Self-pay | Admitting: Internal Medicine

## 2023-04-18 ENCOUNTER — Encounter: Payer: Self-pay | Admitting: Oncology

## 2023-04-18 DIAGNOSIS — M25552 Pain in left hip: Secondary | ICD-10-CM

## 2023-04-19 ENCOUNTER — Ambulatory Visit: Attending: Internal Medicine

## 2023-04-27 ENCOUNTER — Ambulatory Visit

## 2023-05-02 ENCOUNTER — Ambulatory Visit
Admission: RE | Admit: 2023-05-02 | Discharge: 2023-05-02 | Disposition: A | Discharge status: Home / Self Care | Source: Ambulatory Visit | Attending: Internal Medicine | Admitting: Internal Medicine

## 2023-05-02 DIAGNOSIS — I83893 Varicose veins of bilateral lower extremities with other complications: Secondary | ICD-10-CM | POA: Insufficient documentation

## 2023-07-12 ENCOUNTER — Ambulatory Visit: Payer: Self-pay | Admitting: Urology

## 2023-07-12 ENCOUNTER — Encounter: Payer: Self-pay | Admitting: Urology

## 2023-09-12 ENCOUNTER — Telehealth: Payer: Self-pay | Admitting: Urology

## 2023-09-12 NOTE — Telephone Encounter (Signed)
 The patient called the office and stated that he had called last Thursday regarding a catheter reorder from [facility]. He mentioned that [facility] faxed a form to our office that needs to be completed and returned in order to process his order. They also sent a follow-up fax this morning.  The patient is currently out of catheters and is concerned about the delay. He is also confused as to why the process must go through our office, as he is attempting to place the order himself.  Could someone please contact the patient to explain the steps he needs to take to obtain catheters today, and also ensure that the necessary forms are completed and sent back to [facility]?

## 2023-09-12 NOTE — Telephone Encounter (Signed)
 Called pt, no answer. Unable to leave message as no voicemail is set up. Patients are always welcome to pick up catheters for [facility]. Catheter orders are similar to prescriptions in that they need to be ordered in a timely fashion prior to running out and that an MD/PA has to approve the order.   Cath order form faxed to [facility].

## 2023-09-13 ENCOUNTER — Telehealth: Payer: Self-pay

## 2023-09-13 NOTE — Telephone Encounter (Signed)
 Pts wife called and left a message. Returned call to pt and let him know he can come in a get a supple of catheters until his supply comes in.

## 2023-10-26 ENCOUNTER — Encounter: Payer: Self-pay | Admitting: Oncology
